# Patient Record
Sex: Female | Born: 1940 | Race: White | Hispanic: No | Marital: Single | Smoking: Never smoker
Health system: Southern US, Community
[De-identification: ages and names within clinical notes are randomized; demographics above are authoritative.]

## PROBLEM LIST (undated history)

## (undated) DIAGNOSIS — F319 Bipolar disorder, unspecified: Secondary | ICD-10-CM

## (undated) DIAGNOSIS — E785 Hyperlipidemia, unspecified: Secondary | ICD-10-CM

## (undated) DIAGNOSIS — E039 Hypothyroidism, unspecified: Secondary | ICD-10-CM

## (undated) DIAGNOSIS — J189 Pneumonia, unspecified organism: Secondary | ICD-10-CM

## (undated) DIAGNOSIS — I10 Essential (primary) hypertension: Secondary | ICD-10-CM

## (undated) DIAGNOSIS — K5732 Diverticulitis of large intestine without perforation or abscess without bleeding: Secondary | ICD-10-CM

## (undated) DIAGNOSIS — R413 Other amnesia: Secondary | ICD-10-CM

## (undated) HISTORY — PX: NO PAST SURGERIES: SHX2092

---

## 2004-12-25 ENCOUNTER — Emergency Department (HOSPITAL_COMMUNITY): Admission: EM | Admit: 2004-12-25 | Discharge: 2004-12-25 | Payer: Self-pay | Admitting: Emergency Medicine

## 2005-11-20 ENCOUNTER — Ambulatory Visit (HOSPITAL_COMMUNITY): Admission: RE | Admit: 2005-11-20 | Discharge: 2005-11-20 | Payer: Self-pay | Admitting: Family Medicine

## 2006-11-25 ENCOUNTER — Ambulatory Visit (HOSPITAL_COMMUNITY): Admission: RE | Admit: 2006-11-25 | Discharge: 2006-11-25 | Payer: Self-pay | Admitting: Family Medicine

## 2007-12-08 ENCOUNTER — Ambulatory Visit (HOSPITAL_COMMUNITY): Admission: RE | Admit: 2007-12-08 | Discharge: 2007-12-08 | Payer: Self-pay | Admitting: Family Medicine

## 2008-12-13 ENCOUNTER — Ambulatory Visit (HOSPITAL_COMMUNITY): Admission: RE | Admit: 2008-12-13 | Discharge: 2008-12-13 | Payer: Self-pay | Admitting: Family Medicine

## 2008-12-17 ENCOUNTER — Encounter: Admission: RE | Admit: 2008-12-17 | Discharge: 2008-12-17 | Payer: Self-pay | Admitting: Family Medicine

## 2010-01-02 ENCOUNTER — Ambulatory Visit (HOSPITAL_COMMUNITY): Admission: RE | Admit: 2010-01-02 | Discharge: 2010-01-02 | Payer: Self-pay | Admitting: Family Medicine

## 2010-05-15 ENCOUNTER — Inpatient Hospital Stay (HOSPITAL_COMMUNITY)
Admission: EM | Admit: 2010-05-15 | Discharge: 2010-05-19 | Payer: Self-pay | Source: Home / Self Care | Attending: Internal Medicine | Admitting: Internal Medicine

## 2010-06-26 ENCOUNTER — Encounter: Payer: Self-pay | Admitting: Family Medicine

## 2010-08-14 LAB — CBC
HCT: 35.4 % — ABNORMAL LOW (ref 36.0–46.0)
Hemoglobin: 11.1 g/dL — ABNORMAL LOW (ref 12.0–15.0)
Hemoglobin: 11.6 g/dL — ABNORMAL LOW (ref 12.0–15.0)
MCH: 30.4 pg (ref 26.0–34.0)
MCHC: 31.4 g/dL (ref 30.0–36.0)
MCHC: 31.4 g/dL (ref 30.0–36.0)
RBC: 3.65 MIL/uL — ABNORMAL LOW (ref 3.87–5.11)
RBC: 3.85 MIL/uL — ABNORMAL LOW (ref 3.87–5.11)
RDW: 13.4 % (ref 11.5–15.5)
WBC: 9.3 10*3/uL (ref 4.0–10.5)
WBC: 9.5 10*3/uL (ref 4.0–10.5)

## 2010-08-14 LAB — BASIC METABOLIC PANEL
BUN: 4 mg/dL — ABNORMAL LOW (ref 6–23)
Chloride: 117 mEq/L — ABNORMAL HIGH (ref 96–112)
Creatinine, Ser: 0.66 mg/dL (ref 0.4–1.2)
GFR calc Af Amer: >60 mL/min (ref >60)
GFR calc Af Amer: >60 mL/min (ref >60)
GFR calc non Af Amer: >60 mL/min (ref >60)
Potassium: 3.7 mEq/L (ref 3.5–5.1)
Sodium: 147 mEq/L — ABNORMAL HIGH (ref 135–145)
Sodium: 147 mEq/L — ABNORMAL HIGH (ref 135–145)

## 2010-08-14 LAB — CK: Total CK: 89 U/L (ref 7–177)

## 2010-08-14 LAB — LITHIUM LEVEL
Lithium Lvl: 0.36 mEq/L — ABNORMAL LOW (ref 0.80–1.40)
Lithium Lvl: 0.49 mEq/L — ABNORMAL LOW (ref 0.80–1.40)

## 2010-08-15 LAB — LITHIUM LEVEL: Lithium Lvl: 2.15 mEq/L (ref 0.80–1.40)

## 2010-08-15 LAB — BASIC METABOLIC PANEL
BUN: 20 mg/dL (ref 6–23)
BUN: 7 mg/dL (ref 6–23)
CO2: 25 mEq/L (ref 19–32)
CO2: 25 mEq/L (ref 19–32)
Chloride: 107 mEq/L (ref 96–112)
Chloride: 117 mEq/L — ABNORMAL HIGH (ref 96–112)
Creatinine, Ser: 0.74 mg/dL (ref 0.4–1.2)
Creatinine, Ser: 1.39 mg/dL — ABNORMAL HIGH (ref 0.4–1.2)
Glucose, Bld: 104 mg/dL — ABNORMAL HIGH (ref 70–99)
Glucose, Bld: 129 mg/dL — ABNORMAL HIGH (ref 70–99)

## 2010-08-15 LAB — RAPID URINE DRUG SCREEN, HOSP PERFORMED
Amphetamines: NOT DETECTED
Barbiturates: NOT DETECTED
Benzodiazepines: NOT DETECTED
Cocaine: NOT DETECTED

## 2010-08-15 LAB — CBC
HCT: 39.5 % (ref 36.0–46.0)
Hemoglobin: 13.2 g/dL (ref 12.0–15.0)
MCH: 30.7 pg (ref 26.0–34.0)
MCHC: 33.4 g/dL (ref 30.0–36.0)
MCV: 91.9 fL (ref 78.0–100.0)
Platelets: 347 10*3/uL (ref 150–400)
RBC: 4.3 MIL/uL (ref 3.87–5.11)
RDW: 12.2 % (ref 11.5–15.5)
WBC: 13.2 10*3/uL — ABNORMAL HIGH (ref 4.0–10.5)

## 2010-08-15 LAB — LIPASE, BLOOD: Lipase: 25 U/L (ref 11–59)

## 2010-08-15 LAB — URINALYSIS, ROUTINE W REFLEX MICROSCOPIC
Nitrite: NEGATIVE
Specific Gravity, Urine: 1.019 (ref 1.005–1.030)
Urobilinogen, UA: 0.2 mg/dL (ref 0.0–1.0)
pH: 6 (ref 5.0–8.0)

## 2010-08-15 LAB — COMPREHENSIVE METABOLIC PANEL
BUN: 24 mg/dL — ABNORMAL HIGH (ref 6–23)
CO2: 24 mEq/L (ref 19–32)
Calcium: 10.4 mg/dL (ref 8.4–10.5)
Creatinine, Ser: 1.85 mg/dL — ABNORMAL HIGH (ref 0.4–1.2)
GFR calc non Af Amer: 27 mL/min — ABNORMAL LOW (ref >60)
Glucose, Bld: 123 mg/dL — ABNORMAL HIGH (ref 70–99)
Sodium: 137 mEq/L (ref 135–145)
Total Protein: 6.9 g/dL (ref 6.0–8.3)

## 2010-08-15 LAB — URINE CULTURE
Colony Count: >=100000
Culture  Setup Time: 201112122359

## 2010-08-15 LAB — URINE MICROSCOPIC-ADD ON

## 2010-08-15 LAB — DIFFERENTIAL
Eosinophils Absolute: 0.1 10*3/uL (ref 0.0–0.7)
Lymphocytes Relative: 10 % — ABNORMAL LOW (ref 12–46)
Lymphs Abs: 1.3 10*3/uL (ref 0.7–4.0)
Monocytes Relative: 7 % (ref 3–12)
Neutro Abs: 10.8 10*3/uL — ABNORMAL HIGH (ref 1.7–7.7)
Neutrophils Relative %: 82 % — ABNORMAL HIGH (ref 43–77)

## 2010-08-15 LAB — TSH: TSH: 2.372 u[IU]/mL (ref 0.350–4.500)

## 2010-08-15 LAB — POCT CARDIAC MARKERS
CKMB, poc: 2.5 ng/mL (ref 1.0–8.0)
Myoglobin, poc: 342 ng/mL (ref 12–200)
Troponin i, poc: <0.05 ng/mL (ref 0.00–0.09)

## 2010-08-15 LAB — CARBAMAZEPINE LEVEL, TOTAL: Carbamazepine Lvl: 10.2 ug/mL (ref 4.0–12.0)

## 2010-10-07 ENCOUNTER — Emergency Department (HOSPITAL_COMMUNITY): Payer: PRIVATE HEALTH INSURANCE

## 2010-10-07 ENCOUNTER — Emergency Department (HOSPITAL_COMMUNITY)
Admission: EM | Admit: 2010-10-07 | Discharge: 2010-10-09 | Disposition: A | Discharge status: Other Acute Inpatient Hospital | Payer: PRIVATE HEALTH INSURANCE | Attending: Emergency Medicine | Admitting: Emergency Medicine

## 2010-10-07 DIAGNOSIS — F319 Bipolar disorder, unspecified: Secondary | ICD-10-CM | POA: Insufficient documentation

## 2010-10-07 DIAGNOSIS — R4182 Altered mental status, unspecified: Secondary | ICD-10-CM | POA: Insufficient documentation

## 2010-10-07 DIAGNOSIS — R4789 Other speech disturbances: Secondary | ICD-10-CM | POA: Insufficient documentation

## 2010-10-07 DIAGNOSIS — R443 Hallucinations, unspecified: Secondary | ICD-10-CM | POA: Insufficient documentation

## 2010-10-07 DIAGNOSIS — I6789 Other cerebrovascular disease: Secondary | ICD-10-CM | POA: Insufficient documentation

## 2010-10-07 DIAGNOSIS — I10 Essential (primary) hypertension: Secondary | ICD-10-CM | POA: Insufficient documentation

## 2010-10-07 LAB — RAPID URINE DRUG SCREEN, HOSP PERFORMED
Amphetamines: NOT DETECTED
Barbiturates: NOT DETECTED
Benzodiazepines: NOT DETECTED
Tetrahydrocannabinol: NOT DETECTED

## 2010-10-08 DIAGNOSIS — F39 Unspecified mood [affective] disorder: Secondary | ICD-10-CM

## 2010-10-08 LAB — CBC
Hemoglobin: 11.3 g/dL — ABNORMAL LOW (ref 12.0–15.0)
MCH: 29.4 pg (ref 26.0–34.0)
MCV: 90.4 fL (ref 78.0–100.0)
RBC: 3.85 MIL/uL — ABNORMAL LOW (ref 3.87–5.11)

## 2010-10-08 LAB — DIFFERENTIAL
Eosinophils Absolute: 0 10*3/uL (ref 0.0–0.7)
Lymphs Abs: 0.8 10*3/uL (ref 0.7–4.0)
Monocytes Relative: 9 % (ref 3–12)
Neutro Abs: 5.3 10*3/uL (ref 1.7–7.7)
Neutrophils Relative %: 79 % — ABNORMAL HIGH (ref 43–77)

## 2010-10-08 LAB — URINALYSIS, ROUTINE W REFLEX MICROSCOPIC
Hgb urine dipstick: NEGATIVE
Protein, ur: NEGATIVE mg/dL
Urobilinogen, UA: 0.2 mg/dL (ref 0.0–1.0)

## 2010-10-08 LAB — COMPREHENSIVE METABOLIC PANEL
BUN: 27 mg/dL — ABNORMAL HIGH (ref 6–23)
CO2: 25 mEq/L (ref 19–32)
Chloride: 105 mEq/L (ref 96–112)
Creatinine, Ser: 0.82 mg/dL (ref 0.4–1.2)
GFR calc non Af Amer: >60 mL/min (ref >60)
Total Bilirubin: 0.2 mg/dL — ABNORMAL LOW (ref 0.3–1.2)

## 2010-10-08 LAB — LITHIUM LEVEL: Lithium Lvl: <0.25 mEq/L — ABNORMAL LOW (ref 0.80–1.40)

## 2010-10-08 LAB — URINE MICROSCOPIC-ADD ON

## 2010-10-09 DIAGNOSIS — F319 Bipolar disorder, unspecified: Secondary | ICD-10-CM

## 2012-02-21 ENCOUNTER — Other Ambulatory Visit (HOSPITAL_COMMUNITY): Payer: Self-pay | Admitting: Family Medicine

## 2012-02-21 DIAGNOSIS — Z1231 Encounter for screening mammogram for malignant neoplasm of breast: Secondary | ICD-10-CM

## 2012-03-04 ENCOUNTER — Ambulatory Visit (HOSPITAL_COMMUNITY): Payer: PRIVATE HEALTH INSURANCE

## 2014-04-26 ENCOUNTER — Inpatient Hospital Stay (HOSPITAL_COMMUNITY)
Admission: EM | Admit: 2014-04-26 | Discharge: 2014-05-03 | DRG: 194 | Disposition: A | Discharge status: Skilled Nursing Facility | Payer: PRIVATE HEALTH INSURANCE | Attending: Internal Medicine | Admitting: Internal Medicine

## 2014-04-26 ENCOUNTER — Encounter (HOSPITAL_COMMUNITY): Payer: Self-pay | Admitting: Emergency Medicine

## 2014-04-26 ENCOUNTER — Emergency Department (HOSPITAL_COMMUNITY): Payer: PRIVATE HEALTH INSURANCE

## 2014-04-26 DIAGNOSIS — I10 Essential (primary) hypertension: Secondary | ICD-10-CM

## 2014-04-26 DIAGNOSIS — R441 Visual hallucinations: Secondary | ICD-10-CM | POA: Diagnosis present

## 2014-04-26 DIAGNOSIS — Z888 Allergy status to other drugs, medicaments and biological substances status: Secondary | ICD-10-CM | POA: Diagnosis not present

## 2014-04-26 DIAGNOSIS — E039 Hypothyroidism, unspecified: Secondary | ICD-10-CM | POA: Diagnosis not present

## 2014-04-26 DIAGNOSIS — E870 Hyperosmolality and hypernatremia: Secondary | ICD-10-CM | POA: Diagnosis not present

## 2014-04-26 DIAGNOSIS — R05 Cough: Secondary | ICD-10-CM | POA: Insufficient documentation

## 2014-04-26 DIAGNOSIS — E785 Hyperlipidemia, unspecified: Secondary | ICD-10-CM | POA: Diagnosis not present

## 2014-04-26 DIAGNOSIS — E860 Dehydration: Secondary | ICD-10-CM | POA: Diagnosis present

## 2014-04-26 DIAGNOSIS — Z885 Allergy status to narcotic agent status: Secondary | ICD-10-CM | POA: Diagnosis not present

## 2014-04-26 DIAGNOSIS — H109 Unspecified conjunctivitis: Secondary | ICD-10-CM | POA: Diagnosis not present

## 2014-04-26 DIAGNOSIS — J690 Pneumonitis due to inhalation of food and vomit: Secondary | ICD-10-CM | POA: Diagnosis not present

## 2014-04-26 DIAGNOSIS — Z23 Encounter for immunization: Secondary | ICD-10-CM | POA: Diagnosis not present

## 2014-04-26 DIAGNOSIS — J189 Pneumonia, unspecified organism: Principal | ICD-10-CM | POA: Diagnosis present

## 2014-04-26 DIAGNOSIS — R413 Other amnesia: Secondary | ICD-10-CM | POA: Diagnosis not present

## 2014-04-26 DIAGNOSIS — F319 Bipolar disorder, unspecified: Secondary | ICD-10-CM

## 2014-04-26 DIAGNOSIS — N179 Acute kidney failure, unspecified: Secondary | ICD-10-CM | POA: Diagnosis not present

## 2014-04-26 DIAGNOSIS — R531 Weakness: Secondary | ICD-10-CM | POA: Insufficient documentation

## 2014-04-26 DIAGNOSIS — R350 Frequency of micturition: Secondary | ICD-10-CM

## 2014-04-26 DIAGNOSIS — R059 Cough, unspecified: Secondary | ICD-10-CM

## 2014-04-26 HISTORY — DX: Bipolar disorder, unspecified: F31.9

## 2014-04-26 HISTORY — DX: Hyperlipidemia, unspecified: E78.5

## 2014-04-26 HISTORY — DX: Other amnesia: R41.3

## 2014-04-26 HISTORY — DX: Hypothyroidism, unspecified: E03.9

## 2014-04-26 HISTORY — DX: Diverticulitis of large intestine without perforation or abscess without bleeding: K57.32

## 2014-04-26 HISTORY — DX: Essential (primary) hypertension: I10

## 2014-04-26 LAB — COMPREHENSIVE METABOLIC PANEL
ALT: 31 U/L (ref 0–35)
ANION GAP: 12 (ref 5–15)
AST: 34 U/L (ref 0–37)
Albumin: 3.8 g/dL (ref 3.5–5.2)
Alkaline Phosphatase: 137 U/L — ABNORMAL HIGH (ref 39–117)
BUN: 23 mg/dL (ref 6–23)
CALCIUM: 11.2 mg/dL — AB (ref 8.4–10.5)
CO2: 29 meq/L (ref 19–32)
CREATININE: 1.26 mg/dL — AB (ref 0.50–1.10)
Chloride: 99 mEq/L (ref 96–112)
GFR, EST AFRICAN AMERICAN: 48 mL/min — AB (ref >90)
GFR, EST NON AFRICAN AMERICAN: 41 mL/min — AB (ref >90)
GLUCOSE: 133 mg/dL — AB (ref 70–99)
Potassium: 4.7 mEq/L (ref 3.7–5.3)
Sodium: 140 mEq/L (ref 137–147)
TOTAL PROTEIN: 9.2 g/dL — AB (ref 6.0–8.3)
Total Bilirubin: <0.2 mg/dL — ABNORMAL LOW (ref 0.3–1.2)

## 2014-04-26 LAB — CBC WITH DIFFERENTIAL/PLATELET
Basophils Absolute: 0 10*3/uL (ref 0.0–0.1)
Basophils Relative: 0 % (ref 0–1)
EOS PCT: 1 % (ref 0–5)
Eosinophils Absolute: 0.1 10*3/uL (ref 0.0–0.7)
HEMATOCRIT: 40.9 % (ref 36.0–46.0)
Hemoglobin: 13.1 g/dL (ref 12.0–15.0)
LYMPHS ABS: 0.8 10*3/uL (ref 0.7–4.0)
LYMPHS PCT: 5 % — AB (ref 12–46)
MCH: 31.6 pg (ref 26.0–34.0)
MCHC: 32 g/dL (ref 30.0–36.0)
MCV: 98.6 fL (ref 78.0–100.0)
MONO ABS: 1.8 10*3/uL — AB (ref 0.1–1.0)
Monocytes Relative: 10 % (ref 3–12)
Neutro Abs: 14.6 10*3/uL — ABNORMAL HIGH (ref 1.7–7.7)
Neutrophils Relative %: 84 % — ABNORMAL HIGH (ref 43–77)
Platelets: 329 10*3/uL (ref 150–400)
RBC: 4.15 MIL/uL (ref 3.87–5.11)
RDW: 12.7 % (ref 11.5–15.5)
WBC: 17.3 10*3/uL — ABNORMAL HIGH (ref 4.0–10.5)

## 2014-04-26 LAB — URINALYSIS, ROUTINE W REFLEX MICROSCOPIC
Bilirubin Urine: NEGATIVE
Glucose, UA: NEGATIVE mg/dL
Ketones, ur: NEGATIVE mg/dL
NITRITE: NEGATIVE
PH: 7 (ref 5.0–8.0)
Protein, ur: NEGATIVE mg/dL
Specific Gravity, Urine: 1.008 (ref 1.005–1.030)
Urobilinogen, UA: 0.2 mg/dL (ref 0.0–1.0)

## 2014-04-26 LAB — URINE MICROSCOPIC-ADD ON

## 2014-04-26 MED ORDER — SODIUM CHLORIDE 0.9 % IV BOLUS (SEPSIS)
1000.0000 mL | Freq: Once | INTRAVENOUS | Status: AC
  Administered 2014-04-26: 1000 mL via INTRAVENOUS

## 2014-04-26 MED ORDER — ONDANSETRON HCL 4 MG/2ML IJ SOLN: 4.0000 mg | Freq: Four times a day (QID) | INTRAMUSCULAR | Status: DC | PRN

## 2014-04-26 MED ORDER — GUAIFENESIN-DM 100-10 MG/5ML PO SYRP
5.0000 mL | ORAL_SOLUTION | ORAL | Status: DC | PRN
  Administered 2014-04-28 – 2014-04-29 (×3): 5 mL via ORAL
  Filled 2014-04-26 (×3): qty 10

## 2014-04-26 MED ORDER — ONDANSETRON HCL 4 MG PO TABS: 4.0000 mg | ORAL_TABLET | Freq: Four times a day (QID) | ORAL | Status: DC | PRN

## 2014-04-26 MED ORDER — DEXTROSE 5 % IV SOLN
500.0000 mg | INTRAVENOUS | Status: DC
  Administered 2014-04-26: 500 mg via INTRAVENOUS
  Filled 2014-04-26: qty 500

## 2014-04-26 MED ORDER — SIMVASTATIN 20 MG PO TABS
20.0000 mg | ORAL_TABLET | Freq: Every day | ORAL | Status: DC
  Administered 2014-04-27 – 2014-05-02 (×6): 20 mg via ORAL
  Filled 2014-04-26 (×7): qty 1

## 2014-04-26 MED ORDER — IPRATROPIUM-ALBUTEROL 0.5-2.5 (3) MG/3ML IN SOLN
3.0000 mL | Freq: Once | RESPIRATORY_TRACT | Status: AC
  Administered 2014-04-26: 3 mL via RESPIRATORY_TRACT
  Filled 2014-04-26: qty 3

## 2014-04-26 MED ORDER — LITHIUM CARBONATE 300 MG PO CAPS
600.0000 mg | ORAL_CAPSULE | Freq: Every day | ORAL | Status: DC
  Administered 2014-04-27: 600 mg via ORAL
  Filled 2014-04-26 (×2): qty 2

## 2014-04-26 MED ORDER — POLYETHYLENE GLYCOL 3350 17 G PO PACK
17.0000 g | PACK | Freq: Every day | ORAL | Status: DC | PRN
  Filled 2014-04-26: qty 1

## 2014-04-26 MED ORDER — LITHIUM CARBONATE 300 MG PO CAPS
300.0000 mg | ORAL_CAPSULE | Freq: Every day | ORAL | Status: DC
  Filled 2014-04-26: qty 1

## 2014-04-26 MED ORDER — SODIUM CHLORIDE 0.9 % IV SOLN
INTRAVENOUS | Status: DC
  Administered 2014-04-27: via INTRAVENOUS
  Administered 2014-04-27: 100 mL/h via INTRAVENOUS
  Administered 2014-04-28: 11:00:00 via INTRAVENOUS

## 2014-04-26 MED ORDER — AMLODIPINE BESYLATE 5 MG PO TABS
5.0000 mg | ORAL_TABLET | Freq: Every day | ORAL | Status: DC
  Administered 2014-04-27 – 2014-05-03 (×7): 5 mg via ORAL
  Filled 2014-04-26 (×7): qty 1

## 2014-04-26 MED ORDER — LEVOFLOXACIN IN D5W 750 MG/150ML IV SOLN
750.0000 mg | INTRAVENOUS | Status: DC
Start: 2014-04-27 — End: 2014-04-28
  Filled 2014-04-26 (×2): qty 150

## 2014-04-26 MED ORDER — SENNA 8.6 MG PO TABS
1.0000 | ORAL_TABLET | Freq: Two times a day (BID) | ORAL | Status: DC
  Administered 2014-04-27 – 2014-05-02 (×12): 8.6 mg via ORAL
  Filled 2014-04-26 (×11): qty 1

## 2014-04-26 MED ORDER — SODIUM CHLORIDE 0.9 % IJ SOLN
3.0000 mL | Freq: Two times a day (BID) | INTRAMUSCULAR | Status: DC
  Administered 2014-04-27 – 2014-05-01 (×7): 3 mL via INTRAVENOUS

## 2014-04-26 MED ORDER — DEXTROSE 5 % IV SOLN
1.0000 g | INTRAVENOUS | Status: DC
  Administered 2014-04-26: 1 g via INTRAVENOUS
  Filled 2014-04-26: qty 10

## 2014-04-26 MED ORDER — LEVOTHYROXINE SODIUM 125 MCG PO TABS
125.0000 ug | ORAL_TABLET | Freq: Every day | ORAL | Status: DC
  Administered 2014-04-27 – 2014-05-03 (×7): 125 ug via ORAL
  Filled 2014-04-26 (×9): qty 1

## 2014-04-26 MED ORDER — OLOPATADINE HCL 0.1 % OP SOLN
1.0000 [drp] | Freq: Two times a day (BID) | OPHTHALMIC | Status: DC
  Administered 2014-04-27 – 2014-05-03 (×13): 1 [drp] via OPHTHALMIC
  Filled 2014-04-26: qty 5

## 2014-04-26 MED ORDER — HEPARIN SODIUM (PORCINE) 5000 UNIT/ML IJ SOLN
5000.0000 [IU] | Freq: Three times a day (TID) | INTRAMUSCULAR | Status: DC
  Administered 2014-04-27 – 2014-05-03 (×20): 5000 [IU] via SUBCUTANEOUS
  Filled 2014-04-26 (×23): qty 1

## 2014-04-26 MED ORDER — LURASIDONE HCL 40 MG PO TABS
60.0000 mg | ORAL_TABLET | Freq: Every day | ORAL | Status: DC
  Administered 2014-04-27 – 2014-05-03 (×7): 60 mg via ORAL
  Filled 2014-04-26 (×8): qty 2

## 2014-04-26 MED ORDER — CARBAMAZEPINE 200 MG PO TABS
200.0000 mg | ORAL_TABLET | Freq: Two times a day (BID) | ORAL | Status: DC
  Administered 2014-04-27 – 2014-05-03 (×14): 200 mg via ORAL
  Filled 2014-04-26 (×15): qty 1

## 2014-04-26 MED ORDER — BENZTROPINE MESYLATE 1 MG PO TABS
1.0000 mg | ORAL_TABLET | Freq: Every day | ORAL | Status: DC
  Administered 2014-04-27 – 2014-05-03 (×7): 1 mg via ORAL
  Filled 2014-04-26 (×7): qty 1

## 2014-04-26 NOTE — Progress Notes (Signed)
  CARE MANAGEMENT ED NOTE 04/26/2014  Patient:  [name] [name],[name] [name]   Account Number:  [id number]  Date Initiated:  04/26/2014  Documentation initiated by:  Radford PaxFERRERO,Ronin Rehfeldt  Subjective/Objective Assessment:   Patient presents to Ed with cough and congestion for three days.     Subjective/Objective Assessment Detail:   Patient with pmhx of HTN.  WBC 17.3.  885 on 2 liters O2, 2 liters oxygen applied via nasal cannula.     Action/Plan:   Action/Plan Detail:   Anticipated DC Date:       Status Recommendation to Physician:   Result of Recommendation:    Other ED Services  Consult Working Plan   In-house referral  Clinical Social Worker   DC Associate Professorlanning Services  Other  PCP issues    Choice offered to / List presented to:            Status of service:  Completed, signed off  ED Comments:   ED Comments Detail:  EDCM spoke topatient and her fmaily at bedside.  Patient's son [name] [name] reports he was the patient's POA a few years ago but does not have that paperwork and is wodering if he still is her POA?  [name] [name] reports the patient lives with her daughter and son in law.  Patient has Psychotherapeutic services coming to the patient's home. [name] [name] reports the doctor from [facility] comes to home once a month and a RN and case manager also follow the patient. Patient has no other homehealth services at this time. Patient has a shower chair at home.   Patient's son reports up until this week, patient was able to perform her own ADL's.  she has always needed assistance with meals. Patient's son reports that the daughter that patient is living with currently is moving in about a month and they are currently in the process of finding the patient a ALF. Patient is listed as having Medicaid.  No further EDCM needs at this time.

## 2014-04-26 NOTE — ED Notes (Signed)
Pt from home via [facility] c/o cough, congestion, weakness, and urinary frequency for a few days. Thick green sputum present. Pt feels warm to touch.

## 2014-04-26 NOTE — ED Notes (Signed)
Bed: WA14 Expected date:  Expected time:  Means of arrival:  Comments: ems 

## 2014-04-26 NOTE — H&P (Signed)
[facility] History and Physical  [name] Arhoebe Selmon ZOX:[id number] DOB: 1941-02-08 DOA: 04/26/2014  Referring physician: Dr. Rondall AllegraAllen - [facility] PCP: No primary care provider on file.   Chief Complaint: cough and congestion  HPI: [name] [name] is a 73 y.o. female  7 day h/o cough and congestion. 3 days ago became significantly worse. OTC cold medicines w/o benefit. Now w/ productive green sputum. Associated w/ subjective fevers and generalized weakness. Associated w/ vomiting. NO home O2.   Review of Systems:  Constitutional:  eight loss, night sweats, Fevers, chills, fatigue.  HEENT:   Denies sneezing, nasal congestion, post nasal drip,  Cardio-vascular:  No chest pain, Orthopnea, PND, swelling in lower extremities, anasarca, dizziness, palpitations  GI:  No heartburn, indigestion, abdominal pain, nausea, vomiting, diarrhea, change in bowel habits, Resp:  Per HPI Skin:  no rash or lesions.  GU:  no dysuria, change in color of urine, no frequency. No flank pain.  Endorses urgency Musculoskeletal:  No joint pain or swelling. No decreased range of motion. No back pain.  Psych:  No change in mood or affect. No depression or anxiety. No memory loss.   Past Medical History  Diagnosis Date  . Hypertension   . Bipolar 1 disorder   . Hypothyroidism   . Memory difficulties   . HLD (hyperlipidemia)   . Diverticulitis large intestine    History reviewed. No pertinent past surgical history. Social History:  reports that she has never smoked. She does not have any smokeless tobacco history on file. She reports that she does not drink alcohol. Her drug history is not on file.  Allergies  Allergen Reactions  . Antihistamine Decongestant [Triprolidine-Pse] Diarrhea  . Codeine Other (See Comments)    Gi upset     Family History  Problem Relation Age of Onset  . Heart disease Father      Prior to Admission medications   Not on File   Physical Exam: Filed Vitals:   04/26/14  1909 04/26/14 1914 04/26/14 1938 04/26/14 2200  BP: 167/84   139/59  Pulse: 92   76  Temp: 99.2 F (37.3 C)     TempSrc: Rectal     Resp: 22   20  SpO2: 88% 97% 96% 91%    Wt Readings from Last 3 Encounters:  No data found for Wt    General: sleeping comfortably. Easily awoken and engaged in conversation.  Eyes: L eye w/ mild crusty discharge and injected slcera throughout. R eye nml. PERRL, EOMI ENT: Dry mucus membranes.   Neck:  no LAD, masses or thyromegaly Cardiovascular: III/VI systolic murmur, RRR, Trace LE edema bilat Telemetry:  SR, no arrhythmias  Respiratory: Crackles in R lung base w/ slightly diminished breath sounds. Slight increased WOB.  Abdomen:  soft, ntnd Skin:  no rash or induration seen on limited exam Musculoskeletal:  grossly normal tone BUE/BLE Psychiatric:  grossly normal mood and affect, speech fluent and appropriate Neurologic:  grossly non-focal.          Labs on Admission:  Basic Metabolic Panel:  Recent Labs Lab 04/26/14 2037  NA 140  K 4.7  CL 99  CO2 29  GLUCOSE 133*  BUN 23  CREATININE 1.26*  CALCIUM 11.2*   Liver Function Tests:  Recent Labs Lab 04/26/14 2037  AST 34  ALT 31  ALKPHOS 137*  BILITOT <0.2*  PROT 9.2*  ALBUMIN 3.8   No results for input(s): LIPASE, AMYLASE in the last 168 hours. No results for input(s): AMMONIA in  the last 168 hours. CBC:  Recent Labs Lab 04/26/14 2037  WBC 17.3*  NEUTROABS 14.6*  HGB 13.1  HCT 40.9  MCV 98.6  PLT 329   Cardiac Enzymes: No results for input(s): CKTOTAL, CKMB, CKMBINDEX, TROPONINI in the last 168 hours.  BNP (last 3 results) No results for input(s): PROBNP in the last 8760 hours. CBG: No results for input(s): GLUCAP in the last 168 hours.  Radiological Exams on Admission: Dg Chest Port 1 View  04/26/2014   CLINICAL DATA:  Weakness and cough  EXAM: PORTABLE CHEST - 1 VIEW  COMPARISON:  05/15/2010  FINDINGS: Cardiac shadow is stable. The thoracic aorta is  mildly tortuous. Increased density is noted in the right lung base consistent with early infiltrate. No sizable effusion is seen.  IMPRESSION: Early right basilar infiltrate.   Electronically Signed   By: Alcide CleverMark  Lukens M.D.   On: 04/26/2014 21:57    EKG: Independently reviewed. NSR, occasional PAC. No sign of ischemia   Assessment/Plan Principal Problem:   Community acquired pneumonia Active Problems:   Bipolar 1 disorder   Essential hypertension   Conjunctivitis   HLD (hyperlipidemia)   Hypothyroid   Acute kidney injury  Community Acquired Pneumonia: WBC 17.3, w/ L shift. O2 requirement. CXR showing RLL infiltrate. CTX and Azithro given in ED - Admit - Change Abx to levaquin ( first dose set for 04/27/14) - sputum Cx, legionella Ag, Strep pneumo Ag.  - f/u BCX  AKI: Cr 1.26 on admission. Baseline 0.7. Likely secondary to dehydration.  - IVF NS 1200ml/hr - 1L NS bolus - BMET in am  HTN: intermittently taking over past few days due to illness.  - continue Norvasc  Bipolar: at baseline. Pt w/ known visual and auditory hallucinations at baseline. Intermittent dosing at home over the past couple of days.  - Tegretol and Lithium levels - Continue home Carbamazepine, lithium, latuda, Cogentin - Haldol PRN (pt on Qmo Haldol injections - unsure of when last dose was given)  Conjunctivitis: L eye. Likely viral as non-painful and minimal discharge - [place]  HLD:  - continue home statin  Hypothyroid: - continue home Synthroid   Code Status: FULL DVT Prophylaxis: Hep Family Communication: Son and daughter in law  Disposition Plan: pending improvement  Time spent: >70 min in direct pt care and coordination.   MERRELL, DAVID J [facility] [email or web address] Password [email or web address]

## 2014-04-26 NOTE — ED Provider Notes (Signed)
CSN: [id number]     Arrival date & time 04/26/14  1904 History   First MD Initiated Contact with Patient 04/26/14 1941     Chief Complaint  Patient presents with  . Cough  . Urinary Frequency  . Weakness     (Consider location/radiation/quality/duration/timing/severity/associated sxs/prior Treatment) HPI Comments: Patient here complaining of cough and congestion 3 days. Cough is been productive of green sputum. No vomiting or diarrhea. Subjective fever at home. Increased weakness. Urinary frequency as well 2. Denies any abdominal pain. No rashes. No headache or neck pain. Symptoms persistent and are worse with activity and better with rest. Has used over-the-counter medications without relief.  Patient is a 73 y.o. female presenting with cough, frequency, and weakness. The history is provided by the patient and a relative.  Cough Urinary Frequency  Weakness    Past Medical History  Diagnosis Date  . Hypertension    History reviewed. No pertinent past surgical history. No family history on file. History  Substance Use Topics  . Smoking status: Never Smoker   . Smokeless tobacco: Not on file  . Alcohol Use: No   OB History    No data available     Review of Systems  Respiratory: Positive for cough.   Genitourinary: Positive for frequency.  Neurological: Positive for weakness.  All other systems reviewed and are negative.     Allergies  Review of patient's allergies indicates not on file.  Home Medications   Prior to Admission medications   Not on File   BP 167/84 mmHg  Pulse 92  Temp(Src) 99.2 F (37.3 C) (Rectal)  Resp 22  SpO2 96% Physical Exam  Constitutional: She is oriented to person, place, and time. She appears well-developed and well-nourished.  Non-toxic appearance. No distress.  HENT:  Head: Normocephalic and atraumatic.  Eyes: Conjunctivae, EOM and lids are normal. Pupils are equal, round, and reactive to light.  Neck: Normal range of  motion. Neck supple. No tracheal deviation present. No thyroid mass present.  Cardiovascular: Normal rate, regular rhythm and normal heart sounds.  Exam reveals no gallop.   No murmur heard. Pulmonary/Chest: Effort normal. No stridor. No respiratory distress. She has decreased breath sounds. She has no wheezes. She has rhonchi. She has no rales.  Abdominal: Soft. Normal appearance and bowel sounds are normal. She exhibits no distension. There is no tenderness. There is no rebound and no CVA tenderness.  Musculoskeletal: Normal range of motion. She exhibits no edema or tenderness.  Neurological: She is alert and oriented to person, place, and time. She has normal strength. No cranial nerve deficit or sensory deficit. GCS eye subscore is 4. GCS verbal subscore is 5. GCS motor subscore is 6.  Skin: Skin is warm and dry. No abrasion and no rash noted.  Psychiatric: She has a normal mood and affect. Her speech is normal and behavior is normal.  Nursing note and vitals reviewed.   ED Course  Procedures (including critical care time) Labs Review Labs Reviewed  URINE CULTURE  CULTURE, BLOOD (ROUTINE X 2)  CULTURE, BLOOD (ROUTINE X 2)  URINALYSIS, ROUTINE W REFLEX MICROSCOPIC  CBC WITH DIFFERENTIAL  COMPREHENSIVE METABOLIC PANEL  I-STAT CG4 LACTIC ACID, ED    Imaging Review No results found.   EKG Interpretation   Date/Time:  Monday April 26 2014 19:31:51 EST Ventricular Rate:  84 PR Interval:  147 QRS Duration: 81 QT Interval:  330 QTC Calculation: 390 R Axis:   9 Text Interpretation:  Sinus rhythm  Atrial premature complex Nonspecific T  abnormalities, lateral leads Baseline wander in lead(s) V2 Confirmed by  Jaretssi Kraker  MD, Ruthella Kirchman ([id number]) on 04/26/2014 7:58:53 PM      MDM   Final diagnoses:  None    Pt started on iv antibiotics  For treatment of pneumonia. Will be admitted to the hospitalist service   Toy BakerAnthony T Terelle Dobler, MD 04/26/14 2227

## 2014-04-27 LAB — BASIC METABOLIC PANEL
ANION GAP: 10 (ref 5–15)
BUN: 18 mg/dL (ref 6–23)
CHLORIDE: 106 meq/L (ref 96–112)
CO2: 28 meq/L (ref 19–32)
Calcium: 10.7 mg/dL — ABNORMAL HIGH (ref 8.4–10.5)
Creatinine, Ser: 1.09 mg/dL (ref 0.50–1.10)
GFR calc Af Amer: 57 mL/min — ABNORMAL LOW (ref >90)
GFR calc non Af Amer: 49 mL/min — ABNORMAL LOW (ref >90)
Glucose, Bld: 106 mg/dL — ABNORMAL HIGH (ref 70–99)
POTASSIUM: 4.6 meq/L (ref 3.7–5.3)
SODIUM: 144 meq/L (ref 137–147)

## 2014-04-27 LAB — CARBAMAZEPINE LEVEL, TOTAL: Carbamazepine Lvl: 6.8 ug/mL (ref 4.0–12.0)

## 2014-04-27 LAB — INFLUENZA PANEL BY PCR (TYPE A & B)
H1N1 flu by pcr: NOT DETECTED
INFLBPCR: NEGATIVE
Influenza A By PCR: NEGATIVE

## 2014-04-27 LAB — CBC
HCT: 42.8 % (ref 36.0–46.0)
Hemoglobin: 13.2 g/dL (ref 12.0–15.0)
MCH: 30.8 pg (ref 26.0–34.0)
MCHC: 30.8 g/dL (ref 30.0–36.0)
MCV: 99.8 fL (ref 78.0–100.0)
Platelets: 273 10*3/uL (ref 150–400)
RBC: 4.29 MIL/uL (ref 3.87–5.11)
RDW: 13.1 % (ref 11.5–15.5)
WBC: 16 10*3/uL — AB (ref 4.0–10.5)

## 2014-04-27 LAB — LITHIUM LEVEL: LITHIUM LVL: 1.68 meq/L — AB (ref 0.80–1.40)

## 2014-04-27 LAB — STREP PNEUMONIAE URINARY ANTIGEN: Strep Pneumo Urinary Antigen: NEGATIVE

## 2014-04-27 MED ORDER — MAGIC MOUTHWASH W/LIDOCAINE
15.0000 mL | Freq: Four times a day (QID) | ORAL | Status: DC | PRN
  Filled 2014-04-27: qty 15

## 2014-04-27 NOTE — Plan of Care (Signed)
Problem: Phase I Progression Outcomes Goal: OOB as tolerated unless otherwise ordered Outcome: Completed/Met Date Met:  04/27/14 Goal: Initial discharge plan identified Outcome: Completed/Met Date Met:  04/27/14 Goal: Voiding-avoid urinary catheter unless indicated Outcome: Completed/Met Date Met:  04/27/14

## 2014-04-27 NOTE — Evaluation (Signed)
Occupational Therapy Evaluation Patient Details Name: [name] [name] MRN: [id number] DOB: [birth year] Today's Date: 04/27/2014    History of Present Illness Pt is a 73 yo female admitted for 7 day h/o cough, weakness, congestion, fever and vomiting.  Pt with CAP.  Pt with h/o bipolar d/o, memory loss, HTN, diverticulitis.   Clinical Impression   Pt admitted with above diagnosis and has the deficits listed below.  Pt would benefit from cont OT to attempt to increase I with basic adls back to baseline and decreased the burden of care on the daughter if she is involved with her mother's care after d/c.    Follow Up Recommendations  SNF;Supervision/Assistance - 24 hour    Equipment Recommendations  Other (comment) (unable to determine what pt already has.)    Recommendations for Other Services       Precautions / Restrictions Precautions Precautions: Fall;Other (comment) (droplet precautions) Restrictions Weight Bearing Restrictions: No      Mobility Bed Mobility Overal bed mobility: Needs Assistance Bed Mobility: Supine to Sit     Supine to sit: Max assist     General bed mobility comments: Pt started to come to sitting position but then required assist to finish.  Pt falling asleep every few seconds.  Transfers Overall transfer level: Needs assistance Equipment used: 1 person hand held assist Transfers: Sit to/from [employer] Sit to Stand: Mod assist Stand pivot transfers: Max assist       General transfer comment: Pt required assist to advance feet during transfer.    Balance Overall balance assessment: Needs assistance Sitting-balance support: Feet supported;Bilateral upper extremity supported Sitting balance-Leahy Scale: Zero Sitting balance - Comments: Pt falling back and to the R when sitting on EOB   Standing balance support: Bilateral upper extremity supported;During functional activity Standing balance-Leahy Scale: Zero Standing balance  comment: Pt had full outside support to remain standing.                            ADL Overall ADL's : Needs assistance/impaired Eating/Feeding: Moderate assistance;Sitting Eating/Feeding Details (indicate cue type and reason): pt falling asleep.  would attempt to take a bite and then drop the fork Grooming: Total assistance;Sitting Grooming Details (indicate cue type and reason): could not stay awake. Would initiate task but no follow through. Upper Body Bathing: Maximal assistance;Sitting   Lower Body Bathing: Total assistance;Sit to/from stand   Upper Body Dressing : Total assistance;Sitting   Lower Body Dressing: Total assistance;+2 for physical assistance;Sit to/from stand   Toilet Transfer: Maximal assistance;BSC   Toileting- Clothing Manipulation and Hygiene: +2 for physical assistance;Total assistance;Sit to/from stand       Functional mobility during ADLs: Maximal assistance;+2 for physical assistance General ADL Comments: Pt very lethargic so participation was low. Will continue to evaluate next session.     Vision                 Additional Comments: Pt with pink eye in L eye and L eye dropped.  It would open part way during evaluation but at times was closed. Pt not aware enough to paticipate in formal testing.   Perception     Praxis      Pertinent Vitals/Pain Pain Assessment: No/denies pain     Hand Dominance Right   Extremity/Trunk Assessment Upper Extremity Assessment Upper Extremity Assessment: RUE deficits/detail;LUE deficits/detail RUE Deficits / Details: Pt weak and not following commands to test.  Appear to be overall  2/5 strength RUE Coordination: decreased fine motor;decreased gross motor LUE Deficits / Details: Pt 2/5 strength based on observation.  Pt would not follow commands to MMT.  Pt was lethargic and falling asleep during eval. LUE Coordination: decreased fine motor;decreased gross motor   Lower Extremity  Assessment Lower Extremity Assessment: Defer to PT evaluation       Communication Communication Communication: HOH   Cognition Arousal/Alertness: Lethargic Behavior During Therapy:  (confused and lethargic) Overall Cognitive Status: Impaired/Different from baseline Area of Impairment: Orientation;Attention;Memory;Following commands;Safety/judgement;Awareness;Problem solving Orientation Level: Disoriented to;Time;Situation Current Attention Level: Focused Memory: Decreased recall of precautions;Decreased short-term memory Following Commands: Follows one step commands inconsistently Safety/Judgement: Decreased awareness of safety;Decreased awareness of deficits Awareness: Intellectual Problem Solving: Slow processing;Decreased initiation General Comments: Son stated pt has had memory deficits over last year but overall could function with supervision.   General Comments       Exercises       Shoulder Instructions      Home Living Family/patient expects to be discharged to:: Private residence Living Arrangements: Children Available Help at Discharge: Other (Comment) (unsure. no family available.) Type of Home: House                 Bathroom Toilet: Standard                Prior Functioning/Environment Level of Independence: Needs assistance  Gait / Transfers Assistance Needed: Pt was able to walk in house w/o assistive device 3 weeks ago and has detriorated since then.  Pt has been week for last 6 months, but last 3 weeks have been a dramatic change. ADL's / Homemaking Assistance Needed: pts daugther does all cooking and cleaning.  Pt was able to do most bathing/dressing/grooming up until the last month.  Son stated her matress was "ruined" so he was not sure how long she had been having toileting issues but those may have started longer ago. Communication / Swallowing Assistance Needed: Pt very difficult to understand. Comments: pt was lethargic and not speaking  clearly on evaluation.    OT Diagnosis: Generalized weakness;Cognitive deficits;Disturbance of vision;Altered mental status   OT Problem List: Decreased strength;Decreased range of motion;Decreased activity tolerance;Impaired balance (sitting and/or standing);Impaired vision/perception;Decreased coordination;Decreased cognition;Decreased safety awareness;Decreased knowledge of use of DME or AE;Decreased knowledge of precautions;Impaired UE functional use;Increased edema   OT Treatment/Interventions: Self-care/ADL training;Therapeutic activities;DME and/or AE instruction    OT Goals(Current goals can be found in the care plan section) Acute Rehab OT Goals Patient Stated Goal: none OT Goal Formulation: With family Time For Goal Achievement: 05/11/14 Potential to Achieve Goals: Fair ADL Goals Pt Will Perform Eating: with supervision;sitting Pt Will Perform Grooming: with supervision;sitting Pt Will Transfer to Toilet: with min assist;ambulating;bedside commode Additional ADL Goal #1: Pt will sit EOB for 10 min with S to perform adls at EOB. Additional ADL Goal #2: Pt will answer basic orientation questions with 80% accuracy to increase involvement in therapy session.  OT Frequency: Min 2X/week   Barriers to D/C: Decreased caregiver support  Daughter (with whom pt lives) is moving out of the townhouse ASAP.  Family would like to pursue AL for pt at this time and need to talk to SW about this asap.       Co-evaluation              End of Session Equipment Utilized During Treatment: Oxygen Nurse Communication: Mobility status  Activity Tolerance: Patient limited by lethargy Patient left: in chair;with call bell/phone within reach  Time: [id number] OT Time Calculation (min): 35 min Charges:  OT General Charges $OT Visit: 1 Procedure OT Evaluation $Initial OT Evaluation Tier I: 1 Procedure OT Treatments $Self Care/Home Management : 8-22 mins $Therapeutic Activity: 8-22  mins G-Codes:    Hope BuddsJones, Tracker Mance Anne 04/27/2014, 10:48 AM  [phone]

## 2014-04-27 NOTE — Progress Notes (Signed)
Assumed care from Tamala BariSusan Stewart, RN. Agree with previous Rn's assessment.

## 2014-04-27 NOTE — Progress Notes (Addendum)
PROGRESS NOTE  [name] [name] ZOX:[id number] DOB: 08-25-1940 DOA: 04/26/2014 PCP: Astrid DivineGRIFFIN,ELAINE COLLINS, MD  HPI: 7 day h/o cough and congestion. 3 days ago became significantly worse. OTC cold medicines w/o benefit. Now w/ productive green sputum. Associated w/ subjective fevers and generalized weakness. Associated w/ vomiting. NO home O2.   Subjective/ 24 H Interval events Patient endorses feeling a little bit better this morning, states that her breathing is better.  Assessment/Plan: Principal Problem:   Community acquired pneumonia Active Problems:   Bipolar 1 disorder   Essential hypertension   Conjunctivitis   HLD (hyperlipidemia)   Hypothyroid   Acute kidney injury   Community Acquired Pneumonia:  - Based on clinical grounds as well as a chest x-ray with right lower lobe infiltrate - Continue levofloxacin - Strep pneumo negative, legionella antigen is in process - Blood cultures negative so far - Sputum cultures negative so far - r/o flu  AKI: Cr 1.26 on admission. Baseline 0.7. Likely secondary to dehydration.  - Creatinine improved to normal range with fluids - Continue to monitor  HTN: intermittently taking over past few days due to illness.  - continue Norvasc  Bipolar: at baseline. Pt w/ known visual and auditory hallucinations at baseline. Intermittent dosing at home over the past couple of days.  - Lithium levels elevated, hold lithium and recheck levels tomorrow morning - Carbamazepine level normal - Continue with other medications  Conjunctivitis: L eye. Likely viral as non-painful and minimal discharge  HLD: - continue home statin  Hypothyroid: - continue home Synthroid   Diet: Soft Fluids: NS DVT Prophylaxis: heparin  Code Status: Full Family Communication: none this morning  Disposition Plan: inpatient  Consultants:  None   Procedures:  None    Antibiotics Levofloxacin 11/23 >>   Studies  Dg Chest Port 1  View  04/26/2014   CLINICAL DATA:  Weakness and cough  EXAM: PORTABLE CHEST - 1 VIEW  COMPARISON:  05/15/2010  FINDINGS: Cardiac shadow is stable. The thoracic aorta is mildly tortuous. Increased density is noted in the right lung base consistent with early infiltrate. No sizable effusion is seen.  IMPRESSION: Early right basilar infiltrate.   Electronically Signed   By: Alcide CleverMark  Lukens M.D.   On: 04/26/2014 21:57    Objective  Filed Vitals:   04/26/14 2330 04/26/14 2331 04/27/14 0513 04/27/14 0538  BP:  140/70 146/42   Pulse:  66 83   Temp:  99.2 F (37.3 C) 97.7 F (36.5 C)   TempSrc:  Oral Oral   Resp:  28 22   Height: 5\' 5"  (1.651 m)     Weight: 77.7 kg (171 lb 4.8 oz)     SpO2:  92%  91%   No intake or output data in the 24 hours ending 04/27/14 0720 Filed Weights   04/26/14 2330  Weight: 77.7 kg (171 lb 4.8 oz)    Exam:  General: no apparent distress, somewhat drowsy but wakes up easily   Cardiovascular: RRR  Respiratory: good air movement, no wheezing  Abdomen: soft, non tender  MSK: no edema  Neuro: non focal  Data Reviewed: Basic Metabolic Panel:  Recent Labs Lab 04/26/14 2037 04/27/14 0504  NA 140 144  K 4.7 4.6  CL 99 106  CO2 29 28  GLUCOSE 133* 106*  BUN 23 18  CREATININE 1.26* 1.09  CALCIUM 11.2* 10.7*   Liver Function Tests:  Recent Labs Lab 04/26/14 2037  AST 34  ALT 31  ALKPHOS 137*  BILITOT <  0.2*  PROT 9.2*  ALBUMIN 3.8   CBC:  Recent Labs Lab 04/26/14 2037 04/27/14 0504  WBC 17.3* 16.0*  NEUTROABS 14.6*  --   HGB 13.1 13.2  HCT 40.9 42.8  MCV 98.6 99.8  PLT 329 273   Scheduled Meds: . amLODipine  5 mg Oral Daily  . benztropine  1 mg Oral Daily  . carbamazepine  200 mg Oral BID  . heparin  5,000 Units Subcutaneous 3 times per day  . levofloxacin (LEVAQUIN) IV  750 mg Intravenous Q24H  . levothyroxine  125 mcg Oral QAC breakfast  . lurasidone  60 mg Oral Daily  . olopatadine  1 drop Left Eye BID  . senna  1 tablet  Oral BID  . simvastatin  20 mg Oral q1800  . sodium chloride  3 mL Intravenous Q12H   Continuous Infusions: . sodium chloride 100 mL/hr at 04/27/14 0004    Time spent: 35 minutes  Pamella Pertostin Gherghe, MD [facility] Pager [phone]. If 7 PM - 7 AM, please contact night-coverage at [email or web address], password [email or web address] 04/27/2014, 7:20 AM  LOS: 1 day

## 2014-04-27 NOTE — Evaluation (Signed)
Physical Therapy Evaluation Patient Details Name: [name] [name] MRN: [id number] DOB: 13-Feb-1941 Today's Date: 04/27/2014   History of Present Illness  Pt is a 73 yo female admitted for 7 day h/o cough, weakness, congestion, fever and vomiting.  Pt with CAP.  Pt with h/o bipolar d/o, hallucinations at baseline, memory loss, HTN, diverticulitis.  Clinical Impression  On eval, pt required Mod assist +2 for mobility-able to stand and take a few lateral steps [facility]. Eval limited by impaired cognition, lethargy. Unable to safely attempt ambulation this session. At this time, recommend SNF.     Follow Up Recommendations SNF    Equipment Recommendations   (to be determined)    Recommendations for Other Services OT consult     Precautions / Restrictions Precautions Precautions: Fall Precaution Comments: droplet Restrictions Weight Bearing Restrictions: No      Mobility  Bed Mobility Overal bed mobility: Needs Assistance Bed Mobility: Supine to Sit;Sit to Supine     Supine to sit: Mod assist;+2 for safety/equipment;+2 for physical assistance;HOB elevated Sit to supine: Mod assist;+2 for safety/equipment;+2 for physical assistance   General bed mobility comments: Assist for trunk and bil LEs. Increased time. Multimodal cues for safety, technique, completion of task.  Transfers Overall transfer level: Needs assistance Equipment used: Rolling walker (2 wheeled) Transfers: Sit to/from Stand Sit to Stand: Mod assist;+2 safety/equipment;From elevated surface;+2 physical assistance Stand pivot transfers: Mod assist;+2 physical assistance;+2 safety/equipment       General transfer comment: assist to rise,stabilize, control descent. Multimodal cues for safety, technique, hand placement.   Ambulation/Gait             General Gait Details: Side steps towards HOB with walker x 4. Multimodal cues for safety. Assist to move R LE each time.   Stairs             Wheelchair Mobility    Modified Rankin (Stroke Patients Only)       Balance Overall balance assessment: Needs assistance Sitting-balance support: Bilateral upper extremity supported;Feet supported Sitting balance-Leahy Scale: Poor     Standing balance support: Bilateral upper extremity supported;During functional activity Standing balance-Leahy Scale: Poor                               Pertinent Vitals/Pain Pain Assessment: No/denies pain    Home Living                        Prior Function                 Hand Dominance        Extremity/Trunk Assessment   Upper Extremity Assessment: Defer to OT evaluation           Lower Extremity Assessment: Generalized weakness;RLE deficits/detail;LLE deficits/detail;Difficult to assess due to impaired cognition RLE Deficits / Details: able to weightbear with UE support. Unable to officially test due to impaired cognition LLE Deficits / Details: able to weightbear with UE support. Unable to officially test due to impaired cognition  Cervical / Trunk Assessment: Normal  Communication      Cognition Arousal/Alertness: Lethargic   Overall Cognitive Status: No family/caregiver present to determine baseline cognitive functioning Area of Impairment: Memory;Following commands;Problem solving;Safety/judgement   Current Attention Level: Focused Memory: Decreased short-term memory;Decreased recall of precautions Following Commands: Follows one step commands inconsistently Safety/Judgement: Decreased awareness of deficits;Decreased awareness of safety   Problem Solving: Slow processing;Decreased  initiation;Requires verbal cues      General Comments      Exercises        Assessment/Plan    PT Assessment Patient needs continued PT services  PT Diagnosis Difficulty walking;Generalized weakness;Abnormality of gait;Altered mental status   PT Problem List Decreased strength;Decreased range of  motion;Decreased activity tolerance;Decreased balance;Decreased cognition;Decreased coordination;Decreased mobility;Decreased safety awareness;Decreased knowledge of use of DME  PT Treatment Interventions DME instruction;Gait training;Functional mobility training;Therapeutic activities;Patient/family education;Balance training;Therapeutic exercise   PT Goals (Current goals can be found in the Care Plan section) Acute Rehab PT Goals Patient Stated Goal: none PT Goal Formulation: Patient unable to participate in goal setting Time For Goal Achievement: 05/11/14 Potential to Achieve Goals: Fair    Frequency Min 3X/week   Barriers to discharge        Co-evaluation               End of Session Equipment Utilized During Treatment: Gait belt Activity Tolerance: Patient limited by fatigue;Patient limited by lethargy Patient left: in bed;with call bell/phone within reach;with bed alarm set           Time: 1419-1431 PT Time Calculation (min) (ACUTE ONLY): 12 min   Charges:   PT Evaluation $Initial PT Evaluation Tier I: 1 Procedure PT Treatments $Therapeutic Activity: 8-22 mins   PT G Codes:          Rebeca AlertJannie Keatyn Luck, MPT Pager: [phone]

## 2014-04-27 NOTE — Progress Notes (Signed)
CRITICAL VALUE ALERT  Critical value received:  Litihium 1.68  Date of notification:  04/27/2014  Time of notification:  0609  Critical value read back:Yes.    Nurse who received alert:  J.Marvens Hollars  MD notified (1st page):  K.Schorr  Time of first page:  0610  MD notified (2nd page):  Time of second page:  Responding MD:  K.schorr  Time MD responded:  [phone]

## 2014-04-27 NOTE — Progress Notes (Signed)
Utilization Review completed.  Kylee Nardozzi RN CM  

## 2014-04-28 DIAGNOSIS — F319 Bipolar disorder, unspecified: Secondary | ICD-10-CM

## 2014-04-28 DIAGNOSIS — J189 Pneumonia, unspecified organism: Principal | ICD-10-CM

## 2014-04-28 LAB — CBC
HEMATOCRIT: 41.9 % (ref 36.0–46.0)
Hemoglobin: 12.5 g/dL (ref 12.0–15.0)
MCH: 30.5 pg (ref 26.0–34.0)
MCHC: 29.8 g/dL — ABNORMAL LOW (ref 30.0–36.0)
MCV: 102.2 fL — AB (ref 78.0–100.0)
Platelets: 274 10*3/uL (ref 150–400)
RBC: 4.1 MIL/uL (ref 3.87–5.11)
RDW: 13.4 % (ref 11.5–15.5)
WBC: 17.5 10*3/uL — AB (ref 4.0–10.5)

## 2014-04-28 LAB — BASIC METABOLIC PANEL
Anion gap: 12 (ref 5–15)
BUN: 15 mg/dL (ref 6–23)
CHLORIDE: 113 meq/L — AB (ref 96–112)
CO2: 27 mEq/L (ref 19–32)
CREATININE: 0.98 mg/dL (ref 0.50–1.10)
Calcium: 10.9 mg/dL — ABNORMAL HIGH (ref 8.4–10.5)
GFR calc Af Amer: 65 mL/min — ABNORMAL LOW (ref >90)
GFR calc non Af Amer: 56 mL/min — ABNORMAL LOW (ref >90)
Glucose, Bld: 110 mg/dL — ABNORMAL HIGH (ref 70–99)
Potassium: 4.7 mEq/L (ref 3.7–5.3)
Sodium: 152 mEq/L — ABNORMAL HIGH (ref 137–147)

## 2014-04-28 LAB — LEGIONELLA ANTIGEN, URINE

## 2014-04-28 LAB — CARBAMAZEPINE LEVEL, TOTAL: Carbamazepine Lvl: 8.8 ug/mL (ref 4.0–12.0)

## 2014-04-28 LAB — CG4 I-STAT (LACTIC ACID): LACTIC ACID, VENOUS: 0.99 mmol/L (ref 0.5–2.2)

## 2014-04-28 LAB — EXPECTORATED SPUTUM ASSESSMENT W GRAM STAIN, RFLX TO RESP C

## 2014-04-28 LAB — URINE CULTURE
COLONY COUNT: NO GROWTH
Culture: NO GROWTH

## 2014-04-28 LAB — LITHIUM LEVEL: Lithium Lvl: 0.91 mEq/L (ref 0.80–1.40)

## 2014-04-28 MED ORDER — FLUCONAZOLE 200 MG PO TABS
200.0000 mg | ORAL_TABLET | Freq: Once | ORAL | Status: AC
  Administered 2014-04-28: 200 mg via ORAL
  Filled 2014-04-28: qty 1

## 2014-04-28 MED ORDER — LEVOFLOXACIN 750 MG PO TABS
750.0000 mg | ORAL_TABLET | Freq: Once | ORAL | Status: AC
  Administered 2014-04-28: 750 mg via ORAL
  Filled 2014-04-28: qty 1

## 2014-04-28 MED ORDER — LEVOFLOXACIN 750 MG PO TABS
750.0000 mg | ORAL_TABLET | Freq: Every day | ORAL | Status: DC
  Administered 2014-04-29 – 2014-05-02 (×4): 750 mg via ORAL
  Filled 2014-04-28 (×4): qty 1

## 2014-04-28 MED ORDER — NYSTATIN 100000 UNIT/ML MT SUSP
5.0000 mL | Freq: Four times a day (QID) | OROMUCOSAL | Status: DC
  Administered 2014-04-28 – 2014-05-03 (×20): 500000 [IU] via ORAL
  Filled 2014-04-28 (×23): qty 5

## 2014-04-28 MED ORDER — FREE WATER
200.0000 mL | Freq: Three times a day (TID) | Status: DC
  Administered 2014-04-28 – 2014-05-03 (×15): 200 mL via ORAL

## 2014-04-28 MED ORDER — INFLUENZA VAC SPLIT QUAD 0.5 ML IM SUSY
0.5000 mL | PREFILLED_SYRINGE | INTRAMUSCULAR | Status: AC
  Administered 2014-04-29: 0.5 mL via INTRAMUSCULAR
  Filled 2014-04-28 (×2): qty 0.5

## 2014-04-28 NOTE — Progress Notes (Signed)
Physical Therapy Treatment Patient Details Name: [name] [name] MRN: [id number] DOB: 09/22/1940 Today's Date: 04/28/2014    History of Present Illness Pt is a 73 yo female admitted for 7 day h/o cough, weakness, congestion, fever and vomiting.  Pt with CAP.  Pt with h/o bipolar d/o, hallucinations at baseline, memory loss, HTN, diverticulitis.    PT Comments    Pt was able to initiate gait training this tx of 100 feet in hallway.  Pt is min A for all mobility.  Pt is unsteady and unsafe with gait this tx.    Follow Up Recommendations  SNF     Equipment Recommendations       Recommendations for Other Services OT consult     Precautions / Restrictions Precautions Precautions: Fall Restrictions Weight Bearing Restrictions: No    Mobility  Bed Mobility Overal bed mobility: Needs Assistance Bed Mobility: Supine to Sit     Supine to sit: Min assist     General bed mobility comments: A with LE's to EOB; manual A with bed pad for forward scoot   Transfers Overall transfer level: Needs assistance Equipment used: Rolling walker (2 wheeled) Transfers: Sit to/from Stand Sit to Stand: Min assist         General transfer comment: VCs for hand placement and sequencing; increased time for stabilization  Ambulation/Gait Ambulation/Gait assistance: Min assist;+2 safety/equipment Ambulation Distance (Feet): 100 Feet Assistive device: Rolling walker (2 wheeled) Gait Pattern/deviations: Step-through pattern;Decreased stride length;Decreased weight shift to left     General Gait Details: min tactile cues for RW management; pt unsteady with moderate R side lean; limited use of R UE   Stairs            Wheelchair Mobility    Modified Rankin (Stroke Patients Only)       Balance                                    Cognition Arousal/Alertness: Awake/alert Behavior During Therapy: Flat affect Overall Cognitive Status: No family/caregiver present to  determine baseline cognitive functioning                      Exercises      General Comments        Pertinent Vitals/Pain Pain Assessment: No/denies pain    Home Living                      Prior Function            PT Goals (current goals can now be found in the care plan section) Progress towards PT goals: Progressing toward goals    Frequency  Min 3X/week    PT Plan Current plan remains appropriate    Co-evaluation             End of Session Equipment Utilized During Treatment: Gait belt Activity Tolerance: Patient tolerated treatment well Patient left: with call bell/phone within reach;in chair;with chair alarm set     Time: [id number] PT Time Calculation (min) (ACUTE ONLY): 25 min  Charges:                       G Codes:      Miller,Derrick, SPTA 04/28/2014, 4:14 PM   Reviewed above  Felecia ShellingLori Shakeitha Umbaugh  PTA [facility]  Acute  Rehab Pager      [phone]

## 2014-04-28 NOTE — Discharge Summary (Signed)
Physician Discharge Summary  [name] [name] [id number] DOB: 23-Jun-1940 DOA: 04/26/2014  PCP: Astrid DivineGRIFFIN,ELAINE COLLINS, MD  Admit date: 04/26/2014 Discharge date: 04/28/2014  Time spent:  minutes  Recommendations for Outpatient Follow-up:   Discharge Diagnoses:  Principal Problem:   Community acquired pneumonia Active Problems:   Bipolar 1 disorder   Essential hypertension   Conjunctivitis   HLD (hyperlipidemia)   Hypothyroid   Acute kidney injury   Discharge Condition:   Diet recommendation: regular  Filed Weights   04/26/14 2330  Weight: 77.7 kg (171 lb 4.8 oz)    History of present illness:  7 day h/o cough and congestion. 3 days ago became significantly worse. OTC cold medicines w/o benefit. Now w/ productive green sputum. Associated w/ subjective fevers and generalized weakness. Associated w/ vomiting. NO home O2.   Hospital Course:   Community Acquired Pneumonia:  - Based on clinical grounds as well as a chest x-ray with right lower lobe infiltrate - Continue levofloxacin - Strep pneumo negative, legionella antigen is in process - Blood cultures negative so far - Sputum cultures negative so far Flu negative.   AKI: Cr 1.26 on admission. Baseline 0.7. Likely secondary to dehydration.  - Creatinine improved to normal range with fluids - Continue to monitor  HTN: intermittently taking over past few days due to illness.  - continue Norvasc  Bipolar: at baseline. Pt w/ known visual and auditory hallucinations at baseline. Intermittent dosing at home over the past couple of days.  - Carbamazepine level normal - Continue with other medications  Conjunctivitis: L eye. Likely viral as non-painful and minimal discharge  HLD: - continue home statin  Hypothyroid: - continue home Synthroid   Procedures:  none  Consultations:  none  Discharge Exam: Filed Vitals:   04/28/14 0646  BP: 161/86  Pulse:   Temp:   Resp:     General: alert afebrile  comfortable Cardiovascular: s1s2 Respiratory: ctab  Discharge Instructions You were cared for by a hospitalist during your hospital stay. If you have any questions about your discharge medications or the care you received while you were in the hospital after you are discharged, you can call the unit and asked to speak with the hospitalist on call if the hospitalist that took care of you is not available. Once you are discharged, your primary care physician will handle any further medical issues. Please note that NO REFILLS for any discharge medications will be authorized once you are discharged, as it is imperative that you return to your primary care physician (or establish a relationship with a primary care physician if you do not have one) for your aftercare needs so that they can reassess your need for medications and monitor your lab values.   Current Discharge Medication List    CONTINUE these medications which have NOT CHANGED   Details  amLODipine (NORVASC) 5 MG tablet Take 5 mg by mouth daily.    benztropine (COGENTIN) 1 MG tablet Take 1 mg by mouth daily.    carbamazepine (TEGRETOL) 200 MG tablet Take 200 mg by mouth 2 (two) times daily.    guaiFENesin-dextromethorphan (ROBITUSSIN DM) 100-10 MG/5ML syrup Take 5 mLs by mouth every 4 (four) hours as needed for cough.    levothyroxine (SYNTHROID, LEVOTHROID) 125 MCG tablet Take 125 mcg by mouth daily before breakfast.    lithium 300 MG tablet Take 300-600 mg by mouth 2 (two) times daily. 300 mg in the am and 600 mg in the pm    Lurasidone HCl (  LATUDA) 60 MG TABS Take 60 mg by mouth daily.    Pheniramine-PE-APAP (THERAFLU FLU & SORE THROAT) 20-10-650 MG PACK Take 1 Package by mouth every 6 (six) hours as needed (cough).    simvastatin (ZOCOR) 20 MG tablet Take 20 mg by mouth daily at 6 PM.       Allergies  Allergen Reactions  . Antihistamine Decongestant [Triprolidine-Pse] Diarrhea  . Codeine Other (See Comments)    Gi  upset       The results of significant diagnostics from this hospitalization (including imaging, microbiology, ancillary and laboratory) are listed below for reference.    Significant Diagnostic Studies: Dg Chest Port 1 View  04/26/2014   CLINICAL DATA:  Weakness and cough  EXAM: PORTABLE CHEST - 1 VIEW  COMPARISON:  05/15/2010  FINDINGS: Cardiac shadow is stable. The thoracic aorta is mildly tortuous. Increased density is noted in the right lung base consistent with early infiltrate. No sizable effusion is seen.  IMPRESSION: Early right basilar infiltrate.   Electronically Signed   By: Alcide CleverMark  Lukens M.D.   On: 04/26/2014 21:57    Microbiology: Recent Results (from the past 240 hour(s))  Urine culture     Status: None   Collection Time: 04/26/14  9:06 PM  Result Value Ref Range Status   Specimen Description URINE, CLEAN CATCH  Final   Special Requests NONE  Final   Culture  Setup Time   Final    04/27/2014 06:19 Performed at [facility]    Colony Count NO GROWTH Performed at [facility]   Final   Culture NO GROWTH Performed at [facility]   Final   Report Status 04/28/2014 FINAL  Final  Culture, sputum-assessment     Status: None   Collection Time: 04/28/14  9:00 AM  Result Value Ref Range Status   Specimen Description SPUTUM  Final   Special Requests NONE  Final   Sputum evaluation   Final    THIS SPECIMEN IS ACCEPTABLE. RESPIRATORY CULTURE REPORT TO FOLLOW.   Report Status 04/28/2014 FINAL  Final     Labs: Basic Metabolic Panel:  Recent Labs Lab 04/26/14 2037 04/27/14 0504 04/28/14 0455  NA 140 144 152*  K 4.7 4.6 4.7  CL 99 106 113*  CO2 29 28 27   GLUCOSE 133* 106* 110*  BUN 23 18 15   CREATININE 1.26* 1.09 0.98  CALCIUM 11.2* 10.7* 10.9*   Liver Function Tests:  Recent Labs Lab 04/26/14 2037  AST 34  ALT 31  ALKPHOS 137*  BILITOT <0.2*  PROT 9.2*  ALBUMIN 3.8   No results for input(s): LIPASE, AMYLASE in the last 168  hours. No results for input(s): AMMONIA in the last 168 hours. CBC:  Recent Labs Lab 04/26/14 2037 04/27/14 0504 04/28/14 0455  WBC 17.3* 16.0* 17.5*  NEUTROABS 14.6*  --   --   HGB 13.1 13.2 12.5  HCT 40.9 42.8 41.9  MCV 98.6 99.8 102.2*  PLT 329 273 274   Cardiac Enzymes: No results for input(s): CKTOTAL, CKMB, CKMBINDEX, TROPONINI in the last 168 hours. BNP: BNP (last 3 results) No results for input(s): PROBNP in the last 8760 hours. CBG: No results for input(s): GLUCAP in the last 168 hours.     SignedKathlen Mody:  Mantaj Chamberlin  [facility] 04/28/2014, 2:54 PM

## 2014-04-28 NOTE — Progress Notes (Signed)
Patient has a bed at [name] when ready for discharge.   Clinical Social Work Department CLINICAL SOCIAL WORK PLACEMENT NOTE 04/28/2014  Patient:  [name],[name]  Account Number:  [id number] Admit date:  04/26/2014  Clinical Social Worker:  Verona Hartshorn FOLEY, LCSWA  Date/time:  04/28/2014 11:42 AM  Clinical Social Work is seeking post-discharge placement for this patient at the following level of care:   SKILLED NURSING   (*CSW will update this form in Epic as items are completed)   04/28/2014  Patient/family provided with [facility] Department of Clinical Social Work's list of facilities offering this level of care within the geographic area requested by the patient (or if unable, by the patient's family).  04/28/2014  Patient/family informed of their freedom to choose among providers that offer the needed level of care, that participate in Medicare, Medicaid or managed care program needed by the patient, have an available bed and are willing to accept the patient.  04/28/2014  Patient/family informed of [facility]' ownership interest in [facility], as well as of the fact that they are under no obligation to receive care at this facility.  PASARR submitted to EDS on 04/28/2014 PASARR number received on 04/28/2014  FL2 transmitted to all facilities in geographic area requested by pt/family on  04/28/2014 FL2 transmitted to all facilities within larger geographic area on   Patient informed that his/her managed care company has contracts with or will negotiate with  certain facilities, including the following:     Patient/family informed of bed offers received:  04/28/2014 Patient chooses bed at [name] Physician recommends and patient chooses bed at    Patient to be transferred to [name] on   Patient to be transferred to facility by  Patient and family notified of transfer on  Name of family member notified:    The following physician  request were entered in Epic:   Additional Comments:   Toccara Alford, LCSW [facility] Clinical Social Worker cell #: [phone]  

## 2014-04-28 NOTE — Progress Notes (Signed)
Clinical Social Work Department BRIEF PSYCHOSOCIAL ASSESSMENT 04/28/2014  Patient:  [name] [name],[name] [name]     Account Number:  [id number]     Admit date:  04/26/2014  Clinical Social Worker:  FOLEY,Nida Manfredi, LCSWA  Date/Time:  04/28/2014 10:37 AM  Referred by:  Physician  Date Referred:  04/28/2014 Referred for  SNF Placement   Other Referral:   Interview type:  Family Other interview type:   patient's son, [name] via phone    PSYCHOSOCIAL DATA Living Status:  WITH ADULT CHILDREN Admitted from facility:   Level of care:   Primary support name:  [name] [name] (son) c#: [phone] Primary support relationship to patient:  CHILD, ADULT Degree of support available:   good    CURRENT CONCERNS Current Concerns  Post-Acute Placement   Other Concerns:    SOCIAL WORK ASSESSMENT / PLAN CSW received consult for SNF placement.   Assessment/plan status:  Information/Referral to Community Resources Other assessment/ plan:   Information/referral to community resources:   CSW completed FL2 and faxed information out to [place] SNFs - provided bed offers to son.    PATIENT'S/FAMILY'S RESPONSE TO PLAN OF CARE: Patient's son is agreeable with plan for SNF, accepted bed at [name]. CSW confirmed with Melissa @ SNF that they would be able to take patient when ready.       Samona Chihuahua, LCSW [facility] Clinical Social Worker cell #: [phone]  

## 2014-04-28 NOTE — Progress Notes (Signed)
Patient has no IV access, after number of attempts by IV team and myself. Patient is a very hard stick. Levaquin po given since IV levaquin cant be given tonight, Kirtland BouchardK. Schorr NP was made aware.

## 2014-04-29 DIAGNOSIS — I10 Essential (primary) hypertension: Secondary | ICD-10-CM

## 2014-04-29 DIAGNOSIS — J189 Pneumonia, unspecified organism: Secondary | ICD-10-CM | POA: Diagnosis not present

## 2014-04-29 LAB — BASIC METABOLIC PANEL
ANION GAP: 11 (ref 5–15)
BUN: 17 mg/dL (ref 6–23)
CALCIUM: 10.6 mg/dL — AB (ref 8.4–10.5)
CO2: 30 mEq/L (ref 19–32)
CREATININE: 1.04 mg/dL (ref 0.50–1.10)
Chloride: 110 mEq/L (ref 96–112)
GFR, EST AFRICAN AMERICAN: 60 mL/min — AB (ref >90)
GFR, EST NON AFRICAN AMERICAN: 52 mL/min — AB (ref >90)
Glucose, Bld: 118 mg/dL — ABNORMAL HIGH (ref 70–99)
Potassium: 4.4 mEq/L (ref 3.7–5.3)
Sodium: 151 mEq/L — ABNORMAL HIGH (ref 137–147)

## 2014-04-29 MED ORDER — DEXTROSE-NACL 5-0.45 % IV SOLN
INTRAVENOUS | Status: DC
  Administered 2014-04-29: 50 mL/h via INTRAVENOUS
  Administered 2014-04-30: 07:00:00 via INTRAVENOUS

## 2014-04-29 NOTE — Plan of Care (Signed)
Problem: Phase I Progression Outcomes Goal: Hemodynamically stable Outcome: Completed/Met Date Met:  04/29/14 Goal: Other Phase I Outcomes/Goals Outcome: Completed/Met Date Met:  04/29/14  Problem: Phase II Progression Outcomes Goal: Progress activity as tolerated unless otherwise ordered Outcome: Completed/Met Date Met:  04/29/14 Goal: IV changed to normal saline lock Outcome: Completed/Met Date Met:  04/29/14 Goal: Obtain order to discontinue catheter if appropriate Outcome: Not Applicable Date Met:  25/74/93 Goal: Other Phase II Outcomes/Goals Outcome: Completed/Met Date Met:  04/29/14

## 2014-04-29 NOTE — Evaluation (Signed)
SLP Cancellation Note  Patient Details Name: [name] [name] MRN: [id number] DOB: 12/30/1940   Cancelled treatment:       Reason Eval/Treat Not Completed: Fatigue/lethargy limiting ability to participate   Donavan Burnetamara [name], MS [facility] SLP [phone]

## 2014-04-29 NOTE — Progress Notes (Signed)
PROGRESS NOTE  [name] [name] [id number] DOB: 03/21/1941 DOA: 04/26/2014 PCP: Astrid DivineGRIFFIN,ELAINE COLLINS, MD  HPI: 7 day h/o cough and congestion. 3 days ago became significantly worse. OTC cold medicines w/o benefit. Now w/ productive green sputum. Associated w/ subjective fevers and generalized weakness. Associated w/ vomiting. NO home O2.   Subjective/ 24 H Interval events Patient endorses feeling a little bit better this morning, states that her breathing is better.  Assessment/Plan: Principal Problem:   Community acquired pneumonia Active Problems:   Bipolar 1 disorder   Essential hypertension   Conjunctivitis   HLD (hyperlipidemia)   Hypothyroid   Acute kidney injury   Community Acquired Pneumonia:  - Based on clinical grounds as well as a chest x-ray with right lower lobe infiltrate - Continue levofloxacin - Strep pneumo negative, legionella antigen negative.  - Blood cultures negative so far - Sputum cultures negative so far -flu PCR negative.   AKI: Cr 1.26 on admission. Baseline 0.7. Likely secondary to dehydration.  - Creatinine improved to normal range with fluids - Continue to monitor  HTN: intermittently taking over past few days due to illness.  - continue Norvasc  Bipolar: at baseline. Pt w/ known visual and auditory hallucinations at baseline. Intermittent dosing at home over the past couple of days.  - Lithium levels elevated, held lithium and repeat levels therapeutic. Resume lithium from tomorrow.  - Carbamazepine level normal - Continue with other medications  Conjunctivitis: L eye. Likely viral as non-painful and minimal discharge  HLD: - continue home statin  Hypothyroid: - continue home Synthroid  Hypernatremia: Encourage free water intake orally and started her on dextrose 0.45 NS fluids.    Diet: Soft Fluids: NS DVT Prophylaxis: heparin  Code Status: Full Family Communication: none this morning  Disposition Plan:  inpatient  Consultants:  None   Procedures:  None    Antibiotics Levofloxacin 11/23 >>   Studies  No results found.  Objective  Filed Vitals:   04/28/14 1822 04/28/14 2256 04/29/14 0446 04/29/14 1446  BP:  131/67 150/70 142/61  Pulse:  74 76 87  Temp:  98.4 F (36.9 C) 98.8 F (37.1 C) 98 F (36.7 C)  TempSrc:  Oral Oral Oral  Resp:  20 20 19   Height:      Weight:      SpO2: 96% 95% 95% 98%    Intake/Output Summary (Last 24 hours) at 04/29/14 1509 Last data filed at 04/29/14 1506  Gross per 24 hour  Intake   1306 ml  Output      0 ml  Net   1306 ml   Filed Weights   04/26/14 2330  Weight: 77.7 kg (171 lb 4.8 oz)    Exam:  General: no apparent distress, somewhat drowsy but wakes up easily   Cardiovascular: RRR  Respiratory: good air movement, no wheezing  Abdomen: soft, non tender  MSK: no edema  Neuro: non focal  Data Reviewed: Basic Metabolic Panel:  Recent Labs Lab 04/26/14 2037 04/27/14 0504 04/28/14 0455 04/29/14 0545  NA 140 144 152* 151*  K 4.7 4.6 4.7 4.4  CL 99 106 113* 110  CO2 29 28 27 30   GLUCOSE 133* 106* 110* 118*  BUN 23 18 15 17   CREATININE 1.26* 1.09 0.98 1.04  CALCIUM 11.2* 10.7* 10.9* 10.6*   Liver Function Tests:  Recent Labs Lab 04/26/14 2037  AST 34  ALT 31  ALKPHOS 137*  BILITOT <0.2*  PROT 9.2*  ALBUMIN 3.8   CBC:  Recent Labs Lab 04/26/14 2037 04/27/14 0504 04/28/14 0455  WBC 17.3* 16.0* 17.5*  NEUTROABS 14.6*  --   --   HGB 13.1 13.2 12.5  HCT 40.9 42.8 41.9  MCV 98.6 99.8 102.2*  PLT 329 273 274   Scheduled Meds: . amLODipine  5 mg Oral Daily  . benztropine  1 mg Oral Daily  . carbamazepine  200 mg Oral BID  . free water  200 mL Oral 3 times per day  . heparin  5,000 Units Subcutaneous 3 times per day  . levofloxacin  750 mg Oral Daily  . levothyroxine  125 mcg Oral QAC breakfast  . lurasidone  60 mg Oral Daily  . nystatin  5 mL Oral QID  . olopatadine  1 drop Left Eye BID  .  senna  1 tablet Oral BID  . simvastatin  20 mg Oral q1800  . sodium chloride  3 mL Intravenous Q12H   Continuous Infusions: . dextrose 5 % and 0.45% NaCl 50 mL/hr (04/29/14 1240)    Time spent: 15 minutes  Kathlen ModyVijaya Gaia Gullikson, MD [facility] Pager [phone] . If 7 PM - 7 AM, please contact night-coverage at [email or web address], password [email or web address] 04/29/2014, 3:09 PM  LOS: 3 days

## 2014-04-30 ENCOUNTER — Inpatient Hospital Stay (HOSPITAL_COMMUNITY): Payer: PRIVATE HEALTH INSURANCE

## 2014-04-30 LAB — BASIC METABOLIC PANEL
Anion gap: 10 (ref 5–15)
BUN: 17 mg/dL (ref 6–23)
CO2: 28 mEq/L (ref 19–32)
Calcium: 10.1 mg/dL (ref 8.4–10.5)
Chloride: 114 mEq/L — ABNORMAL HIGH (ref 96–112)
Creatinine, Ser: 0.99 mg/dL (ref 0.50–1.10)
GFR, EST AFRICAN AMERICAN: 64 mL/min — AB (ref >90)
GFR, EST NON AFRICAN AMERICAN: 55 mL/min — AB (ref >90)
GLUCOSE: 124 mg/dL — AB (ref 70–99)
POTASSIUM: 4.4 meq/L (ref 3.7–5.3)
SODIUM: 152 meq/L — AB (ref 137–147)

## 2014-04-30 LAB — CULTURE, RESPIRATORY W GRAM STAIN: Culture: NORMAL

## 2014-04-30 LAB — CBC
HCT: 39.9 % (ref 36.0–46.0)
Hemoglobin: 11.9 g/dL — ABNORMAL LOW (ref 12.0–15.0)
MCH: 30.5 pg (ref 26.0–34.0)
MCHC: 29.8 g/dL — AB (ref 30.0–36.0)
MCV: 102.3 fL — ABNORMAL HIGH (ref 78.0–100.0)
Platelets: 275 10*3/uL (ref 150–400)
RBC: 3.9 MIL/uL (ref 3.87–5.11)
RDW: 13.4 % (ref 11.5–15.5)
WBC: 13.5 10*3/uL — ABNORMAL HIGH (ref 4.0–10.5)

## 2014-04-30 LAB — CULTURE, RESPIRATORY

## 2014-04-30 MED ORDER — DEXTROSE 5 % IV SOLN
INTRAVENOUS | Status: DC
  Administered 2014-04-30: 14:00:00 via INTRAVENOUS

## 2014-04-30 MED ORDER — LITHIUM CARBONATE 300 MG PO CAPS
600.0000 mg | ORAL_CAPSULE | Freq: Every evening | ORAL | Status: DC
  Administered 2014-04-30 – 2014-05-02 (×3): 600 mg via ORAL
  Filled 2014-04-30 (×4): qty 2

## 2014-04-30 MED ORDER — LITHIUM CARBONATE 300 MG PO CAPS
300.0000 mg | ORAL_CAPSULE | Freq: Every morning | ORAL | Status: DC
  Administered 2014-04-30 – 2014-05-03 (×4): 300 mg via ORAL
  Filled 2014-04-30 (×4): qty 1

## 2014-04-30 MED ORDER — RESOURCE THICKENUP CLEAR PO POWD
ORAL | Status: DC | PRN
  Filled 2014-04-30: qty 125

## 2014-04-30 NOTE — Plan of Care (Signed)
Problem: Phase III Progression Outcomes Goal: Voiding independently Outcome: Completed/Met Date Met:  04/30/14     

## 2014-04-30 NOTE — Progress Notes (Signed)
Pt will drink extreme amounts of water if not monitored.  Also noted to cough frequently after po liquid intake.  Will follow aspiration guidelines.  Day RN reported same during report.  Do you want any restrictions to fluids?

## 2014-04-30 NOTE — Evaluation (Signed)
Clinical/Bedside Swallow Evaluation Patient Details  Name: [name] [name] MRN: [id number] Date of Birth: [birth year]  Today's Date: 04/30/2014 Time: [id number] SLP Time Calculation (min) (ACUTE ONLY): 42 min  Past Medical History:  Past Medical History  Diagnosis Date  . Hypertension   . Bipolar 1 disorder   . Hypothyroidism   . Memory difficulties   . HLD (hyperlipidemia)   . Diverticulitis large intestine    Past Surgical History: History reviewed. No pertinent past surgical history. HPI:  73 [facility] - with 7 days of cough, weakness and congestion-productive to green sputum.  She also experienced vomiting prior to admission.   PMH + for Bipolar disorder, essential HTN, acute kidney injury.  CXR 11/27 shlowed Mild bibasilar opacification right worse than left as well as possible increased density upper lobes that are worse than prior, possibly infection.  RN documented pt is impulsive with liquids and frequently coughs on water.   Swallow eval ordered.    Assessment / Plan / Recommendation Clinical Impression  Suspect pt with component of chronic dysphagia given her report of frequent coughing/choking on thin liquids even prior to admission.  No focal CN deficits but pt is dysarthric.  Although pt did not overtly aspirate/cough with thin liquids during her evaluation, as pt with premorbid dysphagia and current dysarthria suspect episodic aspiration.  Aspiration likely due to premature spillage of liquids into larynx/trachea.  In addition, RN reports coughing with thin water today and results of CXR are worsened therefore will modify liquids to nectar for meals when more likely to aspirate.    Note Md desire to push liquids per progress note, therefore recommend thin water be allowed only between meals.    SLP to follow up for tolerance, readiness for advancement or indication for instrumental testing.  Educated pt/RN and Psychologist, sport and exercisenurse tech to recommendations.  Using  teach back, educated pt to findings, recommendations and clinical reasoning.       Aspiration Risk  Moderate    Diet Recommendation Dysphagia 3 (Mechanical Soft);Nectar-thick liquid;Free water protocol after oral care   Liquid Administration via: Cup;Straw Medication Administration: Whole meds with puree Supervision: Patient able to self feed;Intermittent supervision to cue for compensatory strategies Compensations: Slow rate;Small sips/bites Postural Changes and/or Swallow Maneuvers: Seated upright 90 degrees;Upright 30-60 min after meal    Other  Recommendations Oral Care Recommendations: Oral care BID Other Recommendations: Order thickener from pharmacy   Follow Up Recommendations   (tbd)    Frequency and Duration min 1 x/week  1 week   Pertinent Vitals/Pain Afebrile, decreased      Swallow Study Prior Functional Status   see HHX    General Date of Onset: 04/30/14 HPI: 73 [facility] - with 7 days of cough, weakness and congestion-productive to green sputum.  She also experienced vomiting prior to admission.   PMH + for Bipolar disorder, essential HTN, acute kidney injury.  CXR 11/27 shlowed Mild bibasilar opacification right worse than left as well as possible increased density upper lobes that are worse than prior, possibly infection.  RN documented pt is impulsive with liquids and frequently coughs on water.   Swallow eval ordered.  Type of Study: Bedside swallow evaluation Diet Prior to this Study: Regular;Thin liquids Temperature Spikes Noted: No Respiratory Status: Room air History of Recent Intubation: No Behavior/Cognition: Alert;Cooperative;Pleasant mood Oral Cavity - Dentition: Edentulous Self-Feeding Abilities: Able to feed self Patient Positioning: Upright in chair Baseline Vocal Quality: Clear Volitional Cough:  Strong Volitional Swallow: Able to elicit    Oral/Motor/Sensory Function Overall Oral Motor/Sensory Function: Appears within  functional limits for tasks assessed (no focal cn deficits)   Ice Chips Ice chips: Within functional limits Presentation: Spoon   Thin Liquid Thin Liquid: Impaired Oral Phase Impairments: Reduced lingual movement/coordination Pharyngeal  Phase Impairments: Suspected delayed Swallow    Nectar Thick Nectar Thick Liquid: Within functional limits Presentation: Straw;Cup;Self Fed   Honey Thick Honey Thick Liquid: Not tested   Puree Puree: Within functional limits Presentation: Self Fed;Spoon   Solid   GO    Solid: Impaired Oral Phase Impairments: Impaired anterior to posterior transit;Reduced lingual movement/coordination Oral Phase Functional Implications: Other (comment) (delayed oral transiting) Pharyngeal Phase Impairments: Suspected delayed Fredric MareSwallow       Dvonte Gatliff, MS [facility] SLP [phone]

## 2014-04-30 NOTE — Progress Notes (Signed)
Physical Therapy Treatment Patient Details Name: [name] [name] MRN: [id number] DOB: [birth year] Today's Date: 04/30/2014    History of Present Illness Pt is a 73 yo female admitted for 7 day h/o cough, weakness, congestion, fever and vomiting.  Pt with CAP.  Pt with h/o bipolar d/o, hallucinations at baseline, memory loss, HTN, diverticulitis.    PT Comments    Pt more groggy and required increased assist for mobility.    Follow Up Recommendations  SNF     Equipment Recommendations       Recommendations for Other Services       Precautions / Restrictions Precautions Precautions: Fall Restrictions Weight Bearing Restrictions: No    Mobility  Bed Mobility Overal bed mobility: Needs Assistance Bed Mobility: Supine to Sit     Supine to sit: Max assist     General bed mobility comments: pt appears more groggy and slow to respond this session vs last.    Transfers Overall transfer level: Needs assistance Equipment used: Rolling walker (2 wheeled) Transfers: Sit to/from Stand Sit to Stand: +2 physical assistance;+2 safety/equipment;Max assist         General transfer comment: VCs for hand placement and sequencing; increased time for stabilization plus total assist for turn completion and hand over hand placement  Ambulation/Gait Ambulation/Gait assistance: +2 safety/equipment;Max assist Ambulation Distance (Feet): 75 Feet Assistive device: Rolling walker (2 wheeled) Gait Pattern/deviations: Step-to pattern;Step-through pattern;Staggering right Gait velocity: decreased   General Gait Details: very unsteady/drunken/groggy gait with increase R lean and limited use R UE.     Stairs            Wheelchair Mobility    Modified Rankin (Stroke Patients Only)       Balance                                    Cognition Arousal/Alertness: Awake/alert Behavior During Therapy:  (groggy)                        Exercises       General Comments        Pertinent Vitals/Pain Pain Assessment: No/denies pain    Home Living                      Prior Function            PT Goals (current goals can now be found in the care plan section) Progress towards PT goals: Progressing toward goals    Frequency  Min 3X/week    PT Plan      Co-evaluation             End of Session Equipment Utilized During Treatment: Gait belt Activity Tolerance: Patient tolerated treatment well Patient left: in chair;with call bell/phone within reach;with chair alarm set     Time: [id number] PT Time Calculation (min) (ACUTE ONLY): 26 min  Charges:  $Gait Training: 8-22 mins $Therapeutic Activity: 8-22 mins                    G Codes:       Felecia ShellingLori Senna Lape  PTA [facility]  Acute  Rehab Pager      [phone]

## 2014-04-30 NOTE — Progress Notes (Signed)
PROGRESS NOTE  [name] [name] [id number] DOB: 10-27-1940 DOA: 04/26/2014 PCP: Astrid DivineGRIFFIN,ELAINE COLLINS, MD  HPI: 7 day h/o cough and congestion. 3 days ago became significantly worse. OTC cold medicines w/o benefit. Now w/ productive green sputum. Associated w/ subjective fevers and generalized weakness. Associated w/ vomiting. NO home O2.   Subjective/ 24 H Interval events Patient endorses feeling a little bit better this morning, states that her breathing is better. She reports pain in the ankle and knees.   Assessment/Plan: Principal Problem:   Community acquired pneumonia Active Problems:   Bipolar 1 disorder   Essential hypertension   Conjunctivitis   HLD (hyperlipidemia)   Hypothyroid   Acute kidney injury   Community Acquired Pneumonia:  - Based on clinical grounds as well as a chest x-ray with right lower lobe infiltrate. Repeat CXR slightly worse than on admission.  - Continue levofloxacin - Strep pneumo negative, legionella antigen negative.  - Blood cultures negative so far - Sputum cultures negative so far -flu PCR negative.   AKI: Cr 1.26 on admission. Baseline 0.7. Likely secondary to dehydration.  - Creatinine improved to normal range with fluids - Continue to monitor  HTN: intermittently taking over past few days due to illness.  - continue Norvasc  Bipolar: at baseline. Pt w/ known visual and auditory hallucinations at baseline. Intermittent dosing at home over the past couple of days.  - Lithium levels elevated, held lithium and repeat levels therapeutic. Resume lithium from tomorrow.  - Carbamazepine level normal - Continue with other medications  Conjunctivitis: L eye. Likely viral as non-painful and minimal discharge  HLD: - continue home statin  Hypothyroid: - continue home Synthroid  Hypernatremia: Encourage free water intake orally and started her on dextrose  fluids. Repeat in am.    Diet: Soft/ dysphagia 3 diet.  Fluids: NS DVT  Prophylaxis: heparin  Code Status: Full Family Communication: none this morning  Disposition Plan: inpatient  Consultants:  None   Procedures:  None    Antibiotics Levofloxacin 11/23 >>   Studies  No results found.  Objective  Filed Vitals:   04/29/14 2124 04/30/14 0550 04/30/14 1043 04/30/14 1341  BP: 140/65 139/68 149/85 114/58  Pulse: 76 76 84 82  Temp: 98 F (36.7 C) 98.2 F (36.8 C)  98 F (36.7 C)  TempSrc: Oral Oral  Oral  Resp: 18 18  18   Height:      Weight:      SpO2: 98% 98%  98%    Intake/Output Summary (Last 24 hours) at 04/30/14 1903 Last data filed at 04/30/14 1300  Gross per 24 hour  Intake   4900 ml  Output      0 ml  Net   4900 ml   Filed Weights   04/26/14 2330  Weight: 77.7 kg (171 lb 4.8 oz)    Exam:  General: no apparent distress, somewhat drowsy but wakes up easily   Cardiovascular: RRR  Respiratory: good air movement, no wheezing  Abdomen: soft, non tender  MSK: no edema  Neuro: non focal  Data Reviewed: Basic Metabolic Panel:  Recent Labs Lab 04/26/14 2037 04/27/14 0504 04/28/14 0455 04/29/14 0545 04/30/14 0420  NA 140 144 152* 151* 152*  K 4.7 4.6 4.7 4.4 4.4  CL 99 106 113* 110 114*  CO2 29 28 27 30 28   GLUCOSE 133* 106* 110* 118* 124*  BUN 23 18 15 17 17   CREATININE 1.26* 1.09 0.98 1.04 0.99  CALCIUM 11.2* 10.7* 10.9* 10.6* 10.1  Liver Function Tests:  Recent Labs Lab 04/26/14 2037  AST 34  ALT 31  ALKPHOS 137*  BILITOT <0.2*  PROT 9.2*  ALBUMIN 3.8   CBC:  Recent Labs Lab 04/26/14 2037 04/27/14 0504 04/28/14 0455 04/30/14 0420  WBC 17.3* 16.0* 17.5* 13.5*  NEUTROABS 14.6*  --   --   --   HGB 13.1 13.2 12.5 11.9*  HCT 40.9 42.8 41.9 39.9  MCV 98.6 99.8 102.2* 102.3*  PLT 329 273 274 275   Scheduled Meds: . amLODipine  5 mg Oral Daily  . benztropine  1 mg Oral Daily  . carbamazepine  200 mg Oral BID  . free water  200 mL Oral 3 times per day  . heparin  5,000 Units  Subcutaneous 3 times per day  . levofloxacin  750 mg Oral Daily  . levothyroxine  125 mcg Oral QAC breakfast  . lithium carbonate  300 mg Oral q morning - 10a  . lithium carbonate  600 mg Oral QPM  . lurasidone  60 mg Oral Daily  . nystatin  5 mL Oral QID  . olopatadine  1 drop Left Eye BID  . senna  1 tablet Oral BID  . simvastatin  20 mg Oral q1800  . sodium chloride  3 mL Intravenous Q12H   Continuous Infusions: . dextrose 75 mL/hr at 04/30/14 1350    Time spent: 15 minutes  Kathlen ModyVijaya Jahmez Bily, MD [facility] Pager [phone] . If 7 PM - 7 AM, please contact night-coverage at [email or web address], password [email or web address] 04/30/2014, 7:03 PM  LOS: 4 days

## 2014-04-30 NOTE — Clinical Social Work Note (Signed)
CSW left message with son. Son requested update regarding patient's DC. CSW will wait for call back.   Roddie McBryant Jerimey Burridge MSW, ClayLCSWA, RuchLCASA, [phone]

## 2014-05-01 LAB — BASIC METABOLIC PANEL
Anion gap: 12 (ref 5–15)
BUN: 18 mg/dL (ref 6–23)
CALCIUM: 10.6 mg/dL — AB (ref 8.4–10.5)
CO2: 31 mEq/L (ref 19–32)
Chloride: 108 mEq/L (ref 96–112)
Creatinine, Ser: 1.09 mg/dL (ref 0.50–1.10)
GFR calc Af Amer: 57 mL/min — ABNORMAL LOW (ref >90)
GFR, EST NON AFRICAN AMERICAN: 49 mL/min — AB (ref >90)
GLUCOSE: 192 mg/dL — AB (ref 70–99)
POTASSIUM: 4.2 meq/L (ref 3.7–5.3)
SODIUM: 151 meq/L — AB (ref 137–147)

## 2014-05-01 NOTE — Plan of Care (Signed)
Problem: Phase I Progression Outcomes Goal: Pain controlled with appropriate interventions Outcome: Completed/Met Date Met:  05/01/14  Problem: Phase II Progression Outcomes Goal: Vital signs remain stable Outcome: Completed/Met Date Met:  05/01/14  Problem: Phase III Progression Outcomes Goal: Discharge plan remains appropriate-arrangements made Outcome: Completed/Met Date Met:  05/01/14

## 2014-05-01 NOTE — Progress Notes (Signed)
PROGRESS NOTE  [name] [name] BJY:[id number] DOB: 09-15-1940 DOA: 04/26/2014 PCP: Astrid DivineGRIFFIN,ELAINE COLLINS, MD  HPI: 73 year old lady from home admitted for sob and cough. She was found to have pneumonia. She was started on IV antibiotics and later on transitioned to po antibiotics. Speech and swallow eval recommended dysphagia 3 diet.   Subjective/ 73 H Interval events Patient endorses feeling a little bit better this morning, states that her breathing is better. She reports pain in the ankle and knees. She has 2 bowel movements, will send for c diff.    Assessment/Plan: Principal Problem:   Community acquired pneumonia Active Problems:   Bipolar 1 disorder   Essential hypertension   Conjunctivitis   HLD (hyperlipidemia)   Hypothyroid   Acute kidney injury   Community Acquired Pneumonia:  - Based on clinical grounds as well as a chest x-ray with right lower lobe infiltrate. Repeat CXR slightly worse than on admission.  - Continue levofloxacin - Strep pneumo negative, legionella antigen negative.  - Blood cultures negative so far - Sputum cultures negative so far -flu PCR negative.   AKI: Cr 1.26 on admission. Baseline 0.7. Likely secondary to dehydration.  - Creatinine improved to normal range with fluids - Continue to monitor  HTN: intermittently taking over past few days due to illness.  - continue Norvasc  Bipolar: at baseline. Pt w/ known visual and auditory hallucinations at baseline. Intermittent dosing at home over the past couple of days.  - Lithium levels elevated, held lithium and repeat levels therapeutic. Resume lithium . - Carbamazepine level normal - Continue with other medications  Conjunctivitis: L eye. Likely viral as non-painful and minimal discharge  HLD: - continue home statin  Hypothyroid: - continue home Synthroid  Hypernatremia: Encourage free water intake orally and started her on dextrose  fluids. Repeat in am showed mild improvement.     Diet: Soft/ dysphagia 3 diet.  Fluids: NS DVT Prophylaxis: heparin  Code Status: Full Family Communication: none this morning  Disposition Plan: inpatient  Consultants:  None   Procedures:  None    Antibiotics Levofloxacin 11/23 >>   Studies  Dg Chest Port 1 View  04/30/2014   CLINICAL DATA:  Cough, congestion, weakness and urinary frequency for 2 days.  EXAM: PORTABLE CHEST - 1 VIEW  COMPARISON:  04/26/2014 and 05/15/2010  FINDINGS: Lungs are hypoinflated with mild bibasilar opacification right worse than left slightly worse. Possible new mild opacification over the upper lungs right worse than left. Findings may be due to infection with small right effusion. Cardiomediastinal silhouette and remainder of the exam is unchanged.  IMPRESSION: Mild bibasilar opacification right worse than left as well as possible minimal airspace density over the upper lobes as findings are worse and may be due to infection. Possible small amount right pleural fluid.   Electronically Signed   By: Elberta Fortisaniel  Boyle M.D.   On: 04/30/2014 10:33    Objective  Filed Vitals:   04/30/14 1341 04/30/14 2105 05/01/14 0200 05/01/14 0546  BP: 114/58 138/75  140/70  Pulse: 82 77  74  Temp: 98 F (36.7 C) 98.1 F (36.7 C)  98.1 F (36.7 C)  TempSrc: Oral Oral  Oral  Resp: 18 18  18   Height:      Weight:      SpO2: 98% 85% 95% 97%    Intake/Output Summary (Last 24 hours) at 05/01/14 1400 Last data filed at 05/01/14 0700  Gross per 24 hour  Intake 2167.5 ml  Output  0 ml  Net 2167.5 ml   Filed Weights   04/26/14 2330  Weight: 77.7 kg (171 lb 4.8 oz)    Exam:  General: no apparent distress, somewhat drowsy but wakes up easily   Cardiovascular: RRR  Respiratory: good air movement, no wheezing  Abdomen: soft, non tender  MSK: no edema  Neuro: non focal  Data Reviewed: Basic Metabolic Panel:  Recent Labs Lab 04/27/14 0504 04/28/14 0455 04/29/14 0545 04/30/14 0420  05/01/14 0948  NA 144 152* 151* 152* 151*  K 4.6 4.7 4.4 4.4 4.2  CL 106 113* 110 114* 108  CO2 28 27 30 28 31   GLUCOSE 106* 110* 118* 124* 192*  BUN 18 15 17 17 18   CREATININE 1.09 0.98 1.04 0.99 1.09  CALCIUM 10.7* 10.9* 10.6* 10.1 10.6*   Liver Function Tests:  Recent Labs Lab 04/26/14 2037  AST 34  ALT 31  ALKPHOS 137*  BILITOT <0.2*  PROT 9.2*  ALBUMIN 3.8   CBC:  Recent Labs Lab 04/26/14 2037 04/27/14 0504 04/28/14 0455 04/30/14 0420  WBC 17.3* 16.0* 17.5* 13.5*  NEUTROABS 14.6*  --   --   --   HGB 13.1 13.2 12.5 11.9*  HCT 40.9 42.8 41.9 39.9  MCV 98.6 99.8 102.2* 102.3*  PLT 329 273 274 275   Scheduled Meds: . amLODipine  5 mg Oral Daily  . benztropine  1 mg Oral Daily  . carbamazepine  200 mg Oral BID  . free water  200 mL Oral 3 times per day  . heparin  5,000 Units Subcutaneous 3 times per day  . levofloxacin  750 mg Oral Daily  . levothyroxine  125 mcg Oral QAC breakfast  . lithium carbonate  300 mg Oral q morning - 10a  . lithium carbonate  600 mg Oral QPM  . lurasidone  60 mg Oral Daily  . nystatin  5 mL Oral QID  . olopatadine  1 drop Left Eye BID  . senna  1 tablet Oral BID  . simvastatin  20 mg Oral q1800  . sodium chloride  3 mL Intravenous Q12H   Continuous Infusions: . dextrose 75 mL/hr at 04/30/14 1350    Time spent: 74 minutes  Kathlen ModyVijaya Eliezer Khawaja, MD [facility] Pager [phone] . If 7 PM - 7 AM, please contact night-coverage at [email or web address], password [email or web address] 05/01/2014, 2:00 PM  LOS: 5 days

## 2014-05-01 NOTE — Plan of Care (Signed)
Problem: Phase III Progression Outcomes Goal: Foley discontinued Outcome: Not Applicable Date Met:  05/01/14     

## 2014-05-02 LAB — BASIC METABOLIC PANEL
Anion gap: 8 (ref 5–15)
BUN: 20 mg/dL (ref 6–23)
CO2: 33 mEq/L — ABNORMAL HIGH (ref 19–32)
Calcium: 10.2 mg/dL (ref 8.4–10.5)
Chloride: 105 mEq/L (ref 96–112)
Creatinine, Ser: 1.15 mg/dL — ABNORMAL HIGH (ref 0.50–1.10)
GFR calc Af Amer: 53 mL/min — ABNORMAL LOW (ref >90)
GFR, EST NON AFRICAN AMERICAN: 46 mL/min — AB (ref >90)
GLUCOSE: 133 mg/dL — AB (ref 70–99)
Potassium: 4.6 mEq/L (ref 3.7–5.3)
SODIUM: 146 meq/L (ref 137–147)

## 2014-05-02 MED ORDER — PIPERACILLIN-TAZOBACTAM 3.375 G IVPB
3.3750 g | Freq: Three times a day (TID) | INTRAVENOUS | Status: DC
  Administered 2014-05-02 – 2014-05-03 (×3): 3.375 g via INTRAVENOUS
  Filled 2014-05-02 (×6): qty 50

## 2014-05-02 NOTE — Progress Notes (Signed)
PROGRESS NOTE  [name] Arhoebe Watterson [id number] DOB: 01/04/1941 DOA: 04/26/2014 PCP: Astrid DivineGRIFFIN,ELAINE COLLINS, MD  HPI: 73 year old lady from home admitted for sob and cough. She was found to have pneumonia. She was started on IV antibiotics and later on transitioned to po antibiotics. Speech and swallow eval recommended dysphagia 3 diet.   Subjective/ 24 H Interval events Patient endorses feeling a little bit better this morning, states that her breathing is better. She reports pain in the ankle and knees.   Assessment/Plan: Principal Problem:   Community acquired pneumonia Active Problems:   Bipolar 1 disorder   Essential hypertension   Conjunctivitis   HLD (hyperlipidemia)   Hypothyroid   Acute kidney injury   Community Acquired Pneumonia:  - Based on clinical grounds as well as a chest x-ray with right lower lobe infiltrate. Repeat CXR slightly worse than on admission. Suspect she is continuously aspirating during meals, would change her antibiotics to zosyn.  - Strep pneumo negative, legionella antigen negative.  - Blood cultures negative so far - Sputum cultures negative so far -flu PCR negative.   AKI: Cr 1.26 on admission. Baseline 0.7. Likely secondary to dehydration.  - Creatinine improved to normal range with fluids - Continue to monitor  HTN: intermittently taking over past few days due to illness.  - continue Norvasc  Bipolar: at baseline. Pt w/ known visual and auditory hallucinations at baseline. Intermittent dosing at home over the past couple of days.  - Lithium levels elevated, held lithium and repeat levels therapeutic. Resume lithium . - Carbamazepine level normal - Continue with other medications  Conjunctivitis: L eye. Likely viral as non-painful and minimal discharge  HLD: - continue home statin  Hypothyroid: - continue home Synthroid  Hypernatremia: Encourage free water intake orally and started her on dextrose  fluids. Repeat in am showed  mild improvement.    Diet: Soft/ dysphagia 3 diet.  Fluids: NS DVT Prophylaxis: heparin  Code Status: Full Family Communication: none this morning  Disposition Plan: inpatient  Consultants:  None   Procedures:  None    Antibiotics Levofloxacin 11/23 >>   Studies  No results found.  Objective  Filed Vitals:   05/01/14 0546 05/01/14 1509 05/01/14 2214 05/02/14 0641  BP: 140/70 139/90 134/69 133/78  Pulse: 74 94 80 83  Temp: 98.1 F (36.7 C) 98.4 F (36.9 C) 99.3 F (37.4 C) 99.3 F (37.4 C)  TempSrc: Oral Oral Oral Oral  Resp: 18 16 16 16   Height:      Weight:      SpO2: 97% 93% 92% 94%    Intake/Output Summary (Last 24 hours) at 05/02/14 1246 Last data filed at 05/02/14 0851  Gross per 24 hour  Intake   1910 ml  Output      0 ml  Net   1910 ml   Filed Weights   04/26/14 2330  Weight: 77.7 kg (171 lb 4.8 oz)    Exam:  General: no apparent distress, somewhat drowsy but wakes up easily   Cardiovascular: RRR  Respiratory: good air movement, no wheezing  Abdomen: soft, non tender  MSK: no edema  Neuro: non focal  Data Reviewed: Basic Metabolic Panel:  Recent Labs Lab 04/28/14 0455 04/29/14 0545 04/30/14 0420 05/01/14 0948 05/02/14 0548  NA 152* 151* 152* 151* 146  K 4.7 4.4 4.4 4.2 4.6  CL 113* 110 114* 108 105  CO2 27 30 28 31  33*  GLUCOSE 110* 118* 124* 192* 133*  BUN 15 17  17 18 20   CREATININE 0.98 1.04 0.99 1.09 1.15*  CALCIUM 10.9* 10.6* 10.1 10.6* 10.2   Liver Function Tests:  Recent Labs Lab 04/26/14 2037  AST 34  ALT 31  ALKPHOS 137*  BILITOT <0.2*  PROT 9.2*  ALBUMIN 3.8   CBC:  Recent Labs Lab 04/26/14 2037 04/27/14 0504 04/28/14 0455 04/30/14 0420  WBC 17.3* 16.0* 17.5* 13.5*  NEUTROABS 14.6*  --   --   --   HGB 13.1 13.2 12.5 11.9*  HCT 40.9 42.8 41.9 39.9  MCV 98.6 99.8 102.2* 102.3*  PLT 329 273 274 275   Scheduled Meds: . amLODipine  5 mg Oral Daily  . benztropine  1 mg Oral Daily  .  carbamazepine  200 mg Oral BID  . free water  200 mL Oral 3 times per day  . heparin  5,000 Units Subcutaneous 3 times per day  . levofloxacin  750 mg Oral Daily  . levothyroxine  125 mcg Oral QAC breakfast  . lithium carbonate  300 mg Oral q morning - 10a  . lithium carbonate  600 mg Oral QPM  . lurasidone  60 mg Oral Daily  . nystatin  5 mL Oral QID  . olopatadine  1 drop Left Eye BID  . senna  1 tablet Oral BID  . simvastatin  20 mg Oral q1800  . sodium chloride  3 mL Intravenous Q12H   Continuous Infusions: . dextrose 75 mL/hr at 04/30/14 1350    Time spent: 15 minutes  Kathlen ModyVijaya Kathleena Freeman, MD [facility] Pager [phone] . If 7 PM - 7 AM, please contact night-coverage at [email or web address], password [email or web address] 05/02/2014, 12:46 PM  LOS: 6 days

## 2014-05-02 NOTE — Progress Notes (Signed)
ANTIBIOTIC CONSULT NOTE - INITIAL  Pharmacy Consult for Zosyn Indication: Aspiration PNA  Allergies  Allergen Reactions  . Antihistamine Decongestant [Triprolidine-Pse] Diarrhea  . Codeine Other (See Comments)    Gi upset     Patient Measurements: Height: 5\' 5"  (165.1 cm) Weight: 171 lb 4.8 oz (77.7 kg) IBW/kg (Calculated) : 57 Adjusted Body Weight:   Vital Signs: Temp: 98.9 F (37.2 C) (11/29 1511) Temp Source: Oral (11/29 1511) BP: 127/75 mmHg (11/29 1511) Pulse Rate: 82 (11/29 1511) Intake/Output from previous day: 11/28 0701 - 11/29 0700 In: 1870 [P.O.:790; NG/GT:1080] Out: -  Intake/Output from this shift: Total I/O In: 240 [P.O.:240] Out: -   Labs:  Recent Labs  04/30/14 0420 05/01/14 0948 05/02/14 0548  WBC 13.5*  --   --   HGB 11.9*  --   --   PLT 275  --   --   CREATININE 0.99 1.09 1.15*   Estimated Creatinine Clearance: 44.9 mL/min (by C-G formula based on Cr of 1.15). No results for input(s): VANCOTROUGH, VANCOPEAK, VANCORANDOM, GENTTROUGH, GENTPEAK, GENTRANDOM, TOBRATROUGH, TOBRAPEAK, TOBRARND, AMIKACINPEAK, AMIKACINTROU, AMIKACIN in the last 72 hours.   Microbiology: Recent Results (from the past 720 hour(s))  Urine culture     Status: None   Collection Time: 04/26/14  9:06 PM  Result Value Ref Range Status   Specimen Description URINE, CLEAN CATCH  Final   Special Requests NONE  Final   Culture  Setup Time   Final    04/27/2014 06:19 Performed at [facility]    Colony Count NO GROWTH Performed at [facility]   Final   Culture NO GROWTH Performed at [facility]   Final   Report Status 04/28/2014 FINAL  Final  Culture, sputum-assessment     Status: None   Collection Time: 04/28/14  9:00 AM  Result Value Ref Range Status   Specimen Description SPUTUM  Final   Special Requests NONE  Final   Sputum evaluation   Final    THIS SPECIMEN IS ACCEPTABLE. RESPIRATORY CULTURE REPORT TO FOLLOW.   Report Status  04/28/2014 FINAL  Final  Culture, respiratory (NON-Expectorated)     Status: None   Collection Time: 04/28/14  9:00 AM  Result Value Ref Range Status   Specimen Description SPUTUM  Final   Special Requests NONE  Final   Gram Stain   Final    FEW WBC PRESENT,BOTH PMN AND MONONUCLEAR RARE SQUAMOUS EPITHELIAL CELLS PRESENT RARE GRAM POSITIVE COCCI IN PAIRS RARE GRAM POSITIVE RODS Performed at [facility]    Culture   Final    NORMAL OROPHARYNGEAL FLORA Performed at [facility]    Report Status 04/30/2014 FINAL  Final    Medical History: Past Medical History  Diagnosis Date  . Hypertension   . Bipolar 1 disorder   . Hypothyroidism   . Memory difficulties   . HLD (hyperlipidemia)   . Diverticulitis large intestine     Medications:  Scheduled:  . amLODipine  5 mg Oral Daily  . benztropine  1 mg Oral Daily  . carbamazepine  200 mg Oral BID  . free water  200 mL Oral 3 times per day  . heparin  5,000 Units Subcutaneous 3 times per day  . levothyroxine  125 mcg Oral QAC breakfast  . lithium carbonate  300 mg Oral q morning - 10a  . lithium carbonate  600 mg Oral QPM  . lurasidone  60 mg Oral Daily  . nystatin  5 mL Oral QID  . olopatadine  1 drop Left Eye BID  . senna  1 tablet Oral BID  . simvastatin  20 mg Oral q1800  . sodium chloride  3 mL Intravenous Q12H   Infusions:  . dextrose 75 mL/hr at 04/30/14 1350   PRN: guaiFENesin-dextromethorphan, magic mouthwash w/lidocaine, ondansetron **OR** ondansetron (ZOFRAN) IV, polyethylene glycol, RESOURCE THICKENUP CLEAR Assessment: 73 yo F with SOB/cough--diagnosed with aspiration pneumonia. CXR with Right LL infiltrate. Goal of Therapy:  Eradication of infection  Plan:  Zosyn 3.375 Gm IV q 8 hour --(infuse over 4 hours) Follow up culture results  Loletta Specterenz, Adela Esteban Jamieson 05/02/2014,8:50 PM

## 2014-05-02 NOTE — Plan of Care (Signed)
Problem: Phase II Progression Outcomes Goal: Discharge plan established Outcome: Completed/Met Date Met:  05/02/14  Problem: Discharge Progression Outcomes Goal: Tolerating diet Outcome: Completed/Met Date Met:  05/02/14

## 2014-05-03 ENCOUNTER — Inpatient Hospital Stay (HOSPITAL_COMMUNITY): Payer: PRIVATE HEALTH INSURANCE

## 2014-05-03 LAB — CULTURE, BLOOD (ROUTINE X 2)
CULTURE: NO GROWTH
Culture: NO GROWTH

## 2014-05-03 LAB — BASIC METABOLIC PANEL
Anion gap: 9 (ref 5–15)
BUN: 18 mg/dL (ref 6–23)
CO2: 31 mEq/L (ref 19–32)
Calcium: 10.2 mg/dL (ref 8.4–10.5)
Chloride: 107 mEq/L (ref 96–112)
Creatinine, Ser: 1.17 mg/dL — ABNORMAL HIGH (ref 0.50–1.10)
GFR calc Af Amer: 52 mL/min — ABNORMAL LOW (ref >90)
GFR calc non Af Amer: 45 mL/min — ABNORMAL LOW (ref >90)
GLUCOSE: 136 mg/dL — AB (ref 70–99)
Potassium: 4.2 mEq/L (ref 3.7–5.3)
Sodium: 147 mEq/L (ref 137–147)

## 2014-05-03 MED ORDER — FREE WATER: 200.0000 mL | Freq: Three times a day (TID) | Status: DC

## 2014-05-03 MED ORDER — RESOURCE THICKENUP CLEAR PO POWD: ORAL | Status: DC

## 2014-05-03 MED ORDER — AMOXICILLIN-POT CLAVULANATE 875-125 MG PO TABS: 1.0000 | ORAL_TABLET | Freq: Two times a day (BID) | ORAL | Status: DC

## 2014-05-03 MED ORDER — SENNA 8.6 MG PO TABS: 1.0000 | ORAL_TABLET | Freq: Two times a day (BID) | ORAL | Status: AC

## 2014-05-03 NOTE — Progress Notes (Addendum)
Speech Language Pathology Treatment: Dysphagia  Patient Details Name: [name] [name] MRN: [id number] DOB: 1941/01/11 Today's Date: 05/03/2014 Time: 0920-0932 SLP Time Calculation (min) (ACUTE ONLY): 12 min  Assessment / Plan / Recommendation Clinical Impression   Pt looks at the calendar and states "Its my birthday!"  SLP observed pt with both thin and nectar thick liquids, both tolerated well without evidence of aspiration except for some expectoration of mucous following intake at end of session.  Independently pt takes large sips with rapid intake. Given this behavior and findings of probable intermittent aspiration events impacting pulmonary function, recommend pt continue current diet (dys 3/nectar) with thin water allowed in between meals with supervision for small sips.  Pt reports she does not mind the thickened liquids and verbalizes understanding of need for nectar.    HPI HPI: 73 [facility] - with 7 days of cough, weakness and congestion-productive to green sputum.  She also experienced vomiting prior to admission.   PMH + for Bipolar disorder, essential HTN, acute kidney injury.  CXR 11/27 shlowed Mild bibasilar opacification right worse than left as well as possible increased density upper lobes that are worse than prior, possibly infection.  RN documented pt is impulsive with liquids and frequently coughs on water.   Swallow eval ordered.    Pertinent Vitals Pain Assessment: No/denies pain  SLP Plan  Continue with current plan of care    Recommendations Diet recommendations: Dysphagia 3 (mechanical soft);Nectar-thick liquid Liquids provided via: Cup Medication Administration: Whole meds with puree Supervision: Patient able to self feed;Intermittent supervision to cue for compensatory strategies Compensations: Slow rate;Small sips/bites              Oral Care Recommendations: Oral care BID Plan: Continue with current plan of care    GO    [facility] Center[facility]nie  Joi Leyva, MA CCC-SLP [phone]  Claudine MoutonDeBlois, Traycen Goyer Caroline 05/03/2014, 9:36 AM

## 2014-05-03 NOTE — Discharge Summary (Signed)
Physician Discharge Summary  [name] [name] ZOX:[id number] DOB: 1941/03/20 DOA: 04/26/2014  PCP: Astrid DivineGRIFFIN,ELAINE COLLINS, MD  Admit date: 04/26/2014 Discharge date: 05/03/2014  Time spent: 30 minutes  Recommendations for Outpatient Follow-up:  1. Follow Up with PCP  In one week.  2. Please get referral to neurology for dementia evaluation  3. Please follow up with psychiatry in 2 weeks .  4. Please check renal parameters in 1 to 2 days to evaluate renal function.   Discharge Diagnoses:  Principal Problem:   Community acquired pneumonia Active Problems:   Bipolar 1 disorder   Essential hypertension   Conjunctivitis   HLD (hyperlipidemia)   Hypothyroid   Acute kidney injury   Discharge Condition: improved.  Diet recommendation: (dys 3/nectar) with thin water allowed in between meals with supervision for small sips.  Filed Weights   04/26/14 2330  Weight: 77.7 kg (171 lb 4.8 oz)    History of present illness:  73 year old lady from home admitted for sob and cough. She was found to have pneumonia. She was started on IV antibiotics and later on transitioned to po antibiotics. Speech and swallow eval recommended dysphagia 3 diet.   Hospital Course:   Community Acquired Pneumonia:  - Based on clinical grounds as well as a chest x-ray with right lower lobe infiltrate. Repeat CXR slightly worse than on admission. Suspect she is continuously aspirating during meals, recommend another 3 days of antibiotics for aspiration pneumonia.  - Strep pneumo negative, legionella antigen negative.  - Blood cultures negative so far - Sputum cultures negative so far -flu PCR negative.   AKI: Cr 1.26 on admission. Baseline 0.7. Likely secondary to dehydration.  - Creatinine improved with fluids.  - Continue to monitor  HTN: intermittently taking over past few days due to illness.  - continue Norvasc  Bipolar: at baseline. Pt w/ known visual and auditory hallucinations at baseline. -  Lithium levels elevated, held lithium and repeat levels therapeutic. Resume lithium . - Carbamazepine level normal - Continue with other medications  Conjunctivitis: L eye. Likely viral as non-painful and minimal discharge  HLD: - continue home statin  Hypothyroid: - continue home Synthroid  Hypernatremia:  Encourage free water intake orally and started her on dextrose fluids. Repeat in am showed mild improvement.   Procedures:  none  Consultations: Physical thrapy Discharge Exam: Filed Vitals:   05/03/14 0437  BP: 137/86  Pulse: 77  Temp: 98.8 F (37.1 C)  Resp: 16    General: alert afebrile comfortable Cardiovascular: s1s2 Respiratory: ctab  Discharge Instructions You were cared for by a hospitalist during your hospital stay. If you have any questions about your discharge medications or the care you received while you were in the hospital after you are discharged, you can call the unit and asked to speak with the hospitalist on call if the hospitalist that took care of you is not available. Once you are discharged, your primary care physician will handle any further medical issues. Please note that NO REFILLS for any discharge medications will be authorized once you are discharged, as it is imperative that you return to your primary care physician (or establish a relationship with a primary care physician if you do not have one) for your aftercare needs so that they can reassess your need for medications and monitor your lab values.  Discharge Instructions    Diet - low sodium heart healthy    Complete by:  As directed      Discharge instructions  Complete by:  As directed   Follow up with PCP in one week.  Please check renal function in 1 to 2 days.          Current Discharge Medication List    START taking these medications   Details  amoxicillin-clavulanate (AUGMENTIN) 875-125 MG per tablet Take 1 tablet by mouth 2 (two) times daily. Qty: 6 tablet,  Refills: 0    Maltodextrin-Xanthan Gum (RESOURCE THICKENUP CLEAR) POWD Use as indicated.    senna (SENOKOT) 8.6 MG TABS tablet Take 1 tablet (8.6 mg total) by mouth 2 (two) times daily. Qty: 120 each, Refills: 0    Water For Irrigation, Sterile (FREE WATER) SOLN Take 200 mLs by mouth every 8 (eight) hours.      CONTINUE these medications which have NOT CHANGED   Details  amLODipine (NORVASC) 5 MG tablet Take 5 mg by mouth daily.    benztropine (COGENTIN) 1 MG tablet Take 1 mg by mouth daily.    carbamazepine (TEGRETOL) 200 MG tablet Take 200 mg by mouth 2 (two) times daily.    guaiFENesin-dextromethorphan (ROBITUSSIN DM) 100-10 MG/5ML syrup Take 5 mLs by mouth every 4 (four) hours as needed for cough.    levothyroxine (SYNTHROID, LEVOTHROID) 125 MCG tablet Take 125 mcg by mouth daily before breakfast.    lithium 300 MG tablet Take 300-600 mg by mouth 2 (two) times daily. 300 mg in the am and 600 mg in the pm    Lurasidone HCl (LATUDA) 60 MG TABS Take 60 mg by mouth daily.    Pheniramine-PE-APAP (THERAFLU FLU & SORE THROAT) 20-10-650 MG PACK Take 1 Package by mouth every 6 (six) hours as needed (cough).    simvastatin (ZOCOR) 20 MG tablet Take 20 mg by mouth daily at 6 PM.       Allergies  Allergen Reactions  . Antihistamine Decongestant [Triprolidine-Pse] Diarrhea  . Codeine Other (See Comments)    Gi upset       The results of significant diagnostics from this hospitalization (including imaging, microbiology, ancillary and laboratory) are listed below for reference.    Significant Diagnostic Studies: Dg Chest Port 1 View  05/03/2014   CLINICAL DATA:  Pneumonia.  EXAM: PORTABLE CHEST - 1 VIEW  COMPARISON:  April 30, 2014.  FINDINGS: Stable cardiomediastinal silhouette. No pneumothorax is noted. No significant pleural effusion is noted. Stable mild bibasilar opacities are noted most consistent with subsegmental atelectasis. Bony thorax is intact.  IMPRESSION: Stable  mild bibasilar subsegmental atelectasis.   Electronically Signed   By: Roque LiasJames  Green M.D.   On: 05/03/2014 10:52   Dg Chest Port 1 View  04/30/2014   CLINICAL DATA:  Cough, congestion, weakness and urinary frequency for 2 days.  EXAM: PORTABLE CHEST - 1 VIEW  COMPARISON:  04/26/2014 and 05/15/2010  FINDINGS: Lungs are hypoinflated with mild bibasilar opacification right worse than left slightly worse. Possible new mild opacification over the upper lungs right worse than left. Findings may be due to infection with small right effusion. Cardiomediastinal silhouette and remainder of the exam is unchanged.  IMPRESSION: Mild bibasilar opacification right worse than left as well as possible minimal airspace density over the upper lobes as findings are worse and may be due to infection. Possible small amount right pleural fluid.   Electronically Signed   By: Elberta Fortisaniel  Boyle M.D.   On: 04/30/2014 10:33   Dg Chest Port 1 View  04/26/2014   CLINICAL DATA:  Weakness and cough  EXAM: PORTABLE CHEST -  1 VIEW  COMPARISON:  05/15/2010  FINDINGS: Cardiac shadow is stable. The thoracic aorta is mildly tortuous. Increased density is noted in the right lung base consistent with early infiltrate. No sizable effusion is seen.  IMPRESSION: Early right basilar infiltrate.   Electronically Signed   By: Alcide Clever M.D.   On: 04/26/2014 21:57    Microbiology: Recent Results (from the past 240 hour(s))  Urine culture     Status: None   Collection Time: 04/26/14  9:06 PM  Result Value Ref Range Status   Specimen Description URINE, CLEAN CATCH  Final   Special Requests NONE  Final   Culture  Setup Time   Final    04/27/2014 06:19 Performed at [facility]    Colony Count NO GROWTH Performed at [facility]   Final   Culture NO GROWTH Performed at [facility]   Final   Report Status 04/28/2014 FINAL  Final  Culture, sputum-assessment     Status: None   Collection Time: 04/28/14  9:00 AM   Result Value Ref Range Status   Specimen Description SPUTUM  Final   Special Requests NONE  Final   Sputum evaluation   Final    THIS SPECIMEN IS ACCEPTABLE. RESPIRATORY CULTURE REPORT TO FOLLOW.   Report Status 04/28/2014 FINAL  Final  Culture, respiratory (NON-Expectorated)     Status: None   Collection Time: 04/28/14  9:00 AM  Result Value Ref Range Status   Specimen Description SPUTUM  Final   Special Requests NONE  Final   Gram Stain   Final    FEW WBC PRESENT,BOTH PMN AND MONONUCLEAR RARE SQUAMOUS EPITHELIAL CELLS PRESENT RARE GRAM POSITIVE COCCI IN PAIRS RARE GRAM POSITIVE RODS Performed at [facility]    Culture   Final    NORMAL OROPHARYNGEAL FLORA Performed at [facility]    Report Status 04/30/2014 FINAL  Final     Labs: Basic Metabolic Panel:  Recent Labs Lab 04/29/14 0545 04/30/14 0420 05/01/14 0948 05/02/14 0548 05/03/14 0524  NA 151* 152* 151* 146 147  K 4.4 4.4 4.2 4.6 4.2  CL 110 114* 108 105 107  CO2 30 28 31  33* 31  GLUCOSE 118* 124* 192* 133* 136*  BUN 17 17 18 20 18   CREATININE 1.04 0.99 1.09 1.15* 1.17*  CALCIUM 10.6* 10.1 10.6* 10.2 10.2   Liver Function Tests:  Recent Labs Lab 04/26/14 2037  AST 34  ALT 31  ALKPHOS 137*  BILITOT <0.2*  PROT 9.2*  ALBUMIN 3.8   No results for input(s): LIPASE, AMYLASE in the last 168 hours. No results for input(s): AMMONIA in the last 168 hours. CBC:  Recent Labs Lab 04/26/14 2037 04/27/14 0504 04/28/14 0455 04/30/14 0420  WBC 17.3* 16.0* 17.5* 13.5*  NEUTROABS 14.6*  --   --   --   HGB 13.1 13.2 12.5 11.9*  HCT 40.9 42.8 41.9 39.9  MCV 98.6 99.8 102.2* 102.3*  PLT 329 273 274 275   Cardiac Enzymes: No results for input(s): CKTOTAL, CKMB, CKMBINDEX, TROPONINI in the last 168 hours. BNP: BNP (last 3 results) No results for input(s): PROBNP in the last 8760 hours. CBG: No results for input(s): GLUCAP in the last 168 hours.     SignedKathlen Mody  [facility] 05/03/2014, 11:27 AM

## 2014-05-03 NOTE — Progress Notes (Signed)
Clinical Social Work Department CLINICAL SOCIAL WORK PLACEMENT NOTE 05/03/2014  Patient:  [name] [name],[name] [name]  Account Number:  [id number] Admit date:  04/26/2014  Clinical Social Worker:  Orpah GreekKELLY FOLEY, LCSWA  Date/time:  04/28/2014 11:42 AM  Clinical Social Work is seeking post-discharge placement for this patient at the following level of care:   SKILLED NURSING   (*CSW will update this form in Epic as items are completed)   04/28/2014  Patient/family provided with [facility] Department of Clinical Social Work's list of facilities offering this level of care within the geographic area requested by the patient (or if unable, by the patient's family).  04/28/2014  Patient/family informed of their freedom to choose among providers that offer the needed level of care, that participate in Medicare, Medicaid or managed care program needed by the patient, have an available bed and are willing to accept the patient.  04/28/2014  Patient/family informed of [facility]' ownership interest in [facility], as well as of the fact that they are under no obligation to receive care at this facility.  PASARR submitted to EDS on 04/28/2014 PASARR number received on 04/28/2014  FL2 transmitted to all facilities in geographic area requested by pt/family on  04/28/2014 FL2 transmitted to all facilities within larger geographic area on   Patient informed that his/her managed care company has contracts with or will negotiate with  certain facilities, including the following:     Patient/family informed of bed offers received:  04/28/2014 [facility] Physician recommends and patient chooses bed at    [name]  05/03/2014 Patient to be transferred to facility by [facility] Patient and family notified of transfer on 05/03/2014 Name of family member notified:  patient's son, Molli HazardMatthew via phone  The following physician request were  entered in Epic:   Additional Comments:   Lincoln MaxinKelly Laramie Meissner, LCSW [facility] Clinical Social Worker cell #: [phone]

## 2014-05-03 NOTE — Progress Notes (Signed)
Physical Therapy Treatment Patient Details Name: [name] [name] MRN: [id number] DOB: 03/06/1941 Today's Date: 05/03/2014    History of Present Illness Pt is a 73 yo female admitted for 7 day h/o cough, weakness, congestion, fever and vomiting.  Pt with CAP.  Pt with h/o bipolar d/o, hallucinations at baseline, memory loss, HTN, diverticulitis.    PT Comments    [facility] and was able to tolerate 60 feet of gait training in the hallway this tx.  Pt is min-mod A with all mobility and requires verbal and tactile cueing for transfers and gait.  Pt completed gait training on RA; pt was asymptomatic.    Follow Up Recommendations  SNF     Equipment Recommendations       Recommendations for Other Services       Precautions / Restrictions Precautions Precautions: Fall Restrictions Weight Bearing Restrictions: No    Mobility  Bed Mobility Overal bed mobility: Needs Assistance Bed Mobility: Supine to Sit     Supine to sit: Mod assist;HOB elevated     General bed mobility comments: pt requires min A to get to sitting EOB, mod A for forward scoot using bed pad  Transfers Overall transfer level: Needs assistance Equipment used: Rolling walker (2 wheeled) Transfers: Sit to/from Stand Sit to Stand: Mod assist         General transfer comment: verbal and tactile cues for hand placement; increased time and effort to rise; performed 2x from EOB and BSC  Ambulation/Gait Ambulation/Gait assistance: Min assist Ambulation Distance (Feet): 60 Feet Assistive device: Rolling walker (2 wheeled) Gait Pattern/deviations: Step-to pattern;Shuffle Gait velocity: decreased   General Gait Details: verbal and tactile cues for RW management and forward progress; increased time; pt performs on RA   Stairs            Wheelchair Mobility    Modified Rankin (Stroke Patients Only)       Balance                                    Cognition  Arousal/Alertness: Awake/alert Behavior During Therapy: Flat affect Overall Cognitive Status: No family/caregiver present to determine baseline cognitive functioning                      Exercises      General Comments        Pertinent Vitals/Pain Pain Assessment: No/denies pain    Home Living                      Prior Function            PT Goals (current goals can now be found in the care plan section) Progress towards PT goals: Progressing toward goals    Frequency  Min 3X/week    PT Plan Current plan remains appropriate    Co-evaluation             End of Session Equipment Utilized During Treatment: Gait belt Activity Tolerance: Patient tolerated treatment well Patient left: in chair;with call bell/phone within reach;with chair alarm set     Time: [id number] PT Time Calculation (min) (ACUTE ONLY): 30 min  Charges:                       G Codes:      Miller,Derrick, SPTA 05/03/2014, 2:35 PM   Reviewed  above  Felecia ShellingLori Hessie Varone  PTA [facility]  Acute  Rehab Pager      [phone]

## 2014-05-03 NOTE — Progress Notes (Signed)
Occupational Therapy Treatment Patient Details Name: [name] [name] MRN: [id number] DOB: Sep 14, 1940 Today's Date: 05/03/2014    History of present illness Pt is a 73 yo female admitted for 7 day h/o cough, weakness, congestion, fever and vomiting.  Pt with CAP.  Pt with h/o bipolar d/o, hallucinations at baseline, memory loss, HTN, diverticulitis.   OT comments  Awake and alert, agreeable to participation in skilled OT.  Completed light grooming tasks with set up.  Requires mod/max a for functional transfers with max instructional and tactile guidance for sequencing and safety.  Agree with current recommendations for d/c plans.   Follow Up Recommendations  SNF;Supervision/Assistance - 24 hour                 Precautions / Restrictions Precautions Precautions: Fall Precaution Comments: droplet Restrictions Weight Bearing Restrictions: No       Mobility Bed Mobility Overal bed mobility: Needs Assistance Bed Mobility: Rolling;Sidelying to Sit Rolling: Min guard Sidelying to sit: Min guard;HOB elevated       General bed mobility comments: hob elevated, pt. initiated reaching for bed rails to pull self into sidelying, able to maintain un supported sitting eob  Transfers Overall transfer level: Needs assistance Equipment used: Rolling walker (2 wheeled) Transfers: Sit to/from [employer]Stand;Stand Pivot Transfers Sit to Stand: Mod assist;Max assist Stand pivot transfers: Mod assist;Max assist       General transfer comment: VCs for hand placement and sequencing; increased time for stabilization plus max assist for turn completion and hand over hand placement, due to increased time for any step initiation or progression during pivot.      Balance Overall balance assessment: Needs assistance   Sitting balance-Leahy Scale: Fair       Standing balance-Leahy Scale: Poor                     ADL Overall ADL's : Needs assistance/impaired     Grooming: Wash/dry  face;Wash/dry hands;Oral care;Set up;Bed level;Sitting                   Toilet Transfer: Moderate assistance;Maximal assistance;Cueing for sequencing;Stand-pivot;BSC;RW Toilet Transfer Details (indicate cue type and reason): simulated transfer from eob to recliner Toileting- Clothing Manipulation and Hygiene: Maximal assistance;Sit to/from stand Toileting - Clothing Manipulation Details (indicate cue type and reason): simulated during stand pivot transfer from eob to recliner     Functional mobility during ADLs: Moderate assistance;Maximal assistance;Cueing for sequencing;Rolling walker General ADL Comments: pt. able to maintain un supported sitting eob, and complete light grooming tasks and oral care, mod/max for simulated toileting due to sig. delay with step progression during transfer                                      Cognition   Behavior During Therapy: Flat affect Overall Cognitive Status: No family/caregiver present to determine baseline cognitive functioning Area of Impairment: Memory;Following commands;Problem solving;Safety/judgement   Current Attention Level: Focused    Following Commands: Follows one step commands inconsistently Safety/Judgement: Decreased awareness of deficits;Decreased awareness of safety Awareness: Intellectual Problem Solving: Slow processing;Decreased initiation;Requires verbal cues                                       Pertinent Vitals/ Pain       Pain Assessment: No/denies pain  Frequency Min 2X/week     Progress Toward Goals  OT Goals(current goals can now be found in the care plan section)  Progress towards OT goals: Progressing toward goals     Plan Discharge plan remains appropriate                     End of Session Equipment Utilized During Treatment: Gait belt;Rolling walker;Oxygen   Activity  Tolerance Patient tolerated treatment well   Patient Left in chair;with call bell/phone within reach;Other (comment) (ST in room at end of session)   Nurse Communication Mobility status        Time: [id number] OT Time Calculation (min): 17 min  Charges: OT General Charges $OT Visit: 1 Procedure OT Treatments $Self Care/Home Management : 8-22 mins  Robet LeuMorris, Letishia Elliott Lorraine, COTA/L 05/03/2014, 9:35 AM

## 2014-06-16 ENCOUNTER — Emergency Department (HOSPITAL_COMMUNITY): Payer: Medicare Other

## 2014-06-16 ENCOUNTER — Inpatient Hospital Stay (HOSPITAL_COMMUNITY)
Admission: EM | Admit: 2014-06-16 | Discharge: 2014-06-25 | DRG: 871 | Disposition: A | Discharge status: Skilled Nursing Facility | Payer: Medicare Other | Attending: Internal Medicine | Admitting: Internal Medicine

## 2014-06-16 ENCOUNTER — Inpatient Hospital Stay (HOSPITAL_COMMUNITY): Payer: Medicare Other

## 2014-06-16 ENCOUNTER — Encounter (HOSPITAL_COMMUNITY): Payer: Self-pay | Admitting: *Deleted

## 2014-06-16 DIAGNOSIS — I248 Other forms of acute ischemic heart disease: Secondary | ICD-10-CM | POA: Diagnosis not present

## 2014-06-16 DIAGNOSIS — I739 Peripheral vascular disease, unspecified: Secondary | ICD-10-CM | POA: Diagnosis present

## 2014-06-16 DIAGNOSIS — D7589 Other specified diseases of blood and blood-forming organs: Secondary | ICD-10-CM | POA: Diagnosis present

## 2014-06-16 DIAGNOSIS — R40243 Glasgow coma scale score 3-8: Secondary | ICD-10-CM | POA: Diagnosis not present

## 2014-06-16 DIAGNOSIS — J9601 Acute respiratory failure with hypoxia: Secondary | ICD-10-CM | POA: Diagnosis present

## 2014-06-16 DIAGNOSIS — N189 Chronic kidney disease, unspecified: Secondary | ICD-10-CM | POA: Diagnosis present

## 2014-06-16 DIAGNOSIS — I4510 Unspecified right bundle-branch block: Secondary | ICD-10-CM | POA: Diagnosis present

## 2014-06-16 DIAGNOSIS — R740 Nonspecific elevation of levels of transaminase and lactic acid dehydrogenase [LDH]: Secondary | ICD-10-CM | POA: Diagnosis present

## 2014-06-16 DIAGNOSIS — E785 Hyperlipidemia, unspecified: Secondary | ICD-10-CM | POA: Diagnosis present

## 2014-06-16 DIAGNOSIS — J9600 Acute respiratory failure, unspecified whether with hypoxia or hypercapnia: Secondary | ICD-10-CM

## 2014-06-16 DIAGNOSIS — E870 Hyperosmolality and hypernatremia: Secondary | ICD-10-CM | POA: Diagnosis present

## 2014-06-16 DIAGNOSIS — R7989 Other specified abnormal findings of blood chemistry: Secondary | ICD-10-CM

## 2014-06-16 DIAGNOSIS — I469 Cardiac arrest, cause unspecified: Secondary | ICD-10-CM | POA: Diagnosis not present

## 2014-06-16 DIAGNOSIS — I10 Essential (primary) hypertension: Secondary | ICD-10-CM | POA: Diagnosis present

## 2014-06-16 DIAGNOSIS — R509 Fever, unspecified: Secondary | ICD-10-CM | POA: Diagnosis not present

## 2014-06-16 DIAGNOSIS — D631 Anemia in chronic kidney disease: Secondary | ICD-10-CM | POA: Diagnosis present

## 2014-06-16 DIAGNOSIS — E860 Dehydration: Secondary | ICD-10-CM | POA: Diagnosis present

## 2014-06-16 DIAGNOSIS — N179 Acute kidney failure, unspecified: Secondary | ICD-10-CM | POA: Diagnosis present

## 2014-06-16 DIAGNOSIS — R7401 Elevation of levels of liver transaminase levels: Secondary | ICD-10-CM | POA: Diagnosis present

## 2014-06-16 DIAGNOSIS — R4182 Altered mental status, unspecified: Secondary | ICD-10-CM | POA: Insufficient documentation

## 2014-06-16 DIAGNOSIS — Z79899 Other long term (current) drug therapy: Secondary | ICD-10-CM

## 2014-06-16 DIAGNOSIS — G9341 Metabolic encephalopathy: Secondary | ICD-10-CM | POA: Diagnosis present

## 2014-06-16 DIAGNOSIS — G9340 Encephalopathy, unspecified: Secondary | ICD-10-CM | POA: Diagnosis not present

## 2014-06-16 DIAGNOSIS — Z978 Presence of other specified devices: Secondary | ICD-10-CM

## 2014-06-16 DIAGNOSIS — R6510 Systemic inflammatory response syndrome (SIRS) of non-infectious origin without acute organ dysfunction: Secondary | ICD-10-CM | POA: Diagnosis present

## 2014-06-16 DIAGNOSIS — F319 Bipolar disorder, unspecified: Secondary | ICD-10-CM | POA: Diagnosis present

## 2014-06-16 DIAGNOSIS — E039 Hypothyroidism, unspecified: Secondary | ICD-10-CM | POA: Diagnosis present

## 2014-06-16 DIAGNOSIS — Z4659 Encounter for fitting and adjustment of other gastrointestinal appliance and device: Secondary | ICD-10-CM

## 2014-06-16 DIAGNOSIS — I70209 Unspecified atherosclerosis of native arteries of extremities, unspecified extremity: Secondary | ICD-10-CM | POA: Diagnosis present

## 2014-06-16 DIAGNOSIS — R739 Hyperglycemia, unspecified: Secondary | ICD-10-CM | POA: Diagnosis present

## 2014-06-16 DIAGNOSIS — A419 Sepsis, unspecified organism: Secondary | ICD-10-CM | POA: Diagnosis present

## 2014-06-16 DIAGNOSIS — J690 Pneumonitis due to inhalation of food and vomit: Secondary | ICD-10-CM | POA: Diagnosis not present

## 2014-06-16 DIAGNOSIS — Z7982 Long term (current) use of aspirin: Secondary | ICD-10-CM | POA: Diagnosis not present

## 2014-06-16 DIAGNOSIS — I129 Hypertensive chronic kidney disease with stage 1 through stage 4 chronic kidney disease, or unspecified chronic kidney disease: Secondary | ICD-10-CM | POA: Diagnosis present

## 2014-06-16 DIAGNOSIS — Z452 Encounter for adjustment and management of vascular access device: Secondary | ICD-10-CM

## 2014-06-16 DIAGNOSIS — D696 Thrombocytopenia, unspecified: Secondary | ICD-10-CM | POA: Diagnosis present

## 2014-06-16 DIAGNOSIS — E876 Hypokalemia: Secondary | ICD-10-CM | POA: Diagnosis present

## 2014-06-16 DIAGNOSIS — R778 Other specified abnormalities of plasma proteins: Secondary | ICD-10-CM | POA: Diagnosis present

## 2014-06-16 HISTORY — DX: Pneumonia, unspecified organism: J18.9

## 2014-06-16 LAB — URINALYSIS, ROUTINE W REFLEX MICROSCOPIC
Bilirubin Urine: NEGATIVE
Glucose, UA: NEGATIVE mg/dL
HGB URINE DIPSTICK: NEGATIVE
Ketones, ur: NEGATIVE mg/dL
Leukocytes, UA: NEGATIVE
Nitrite: NEGATIVE
Protein, ur: NEGATIVE mg/dL
SPECIFIC GRAVITY, URINE: 1.012 (ref 1.005–1.030)
Urobilinogen, UA: 0.2 mg/dL (ref 0.0–1.0)
pH: 6.5 (ref 5.0–8.0)

## 2014-06-16 LAB — I-STAT CHEM 8, ED
BUN: 31 mg/dL — AB (ref 6–23)
CALCIUM ION: 1.21 mmol/L (ref 1.13–1.30)
CHLORIDE: 122 meq/L — AB (ref 96–112)
Creatinine, Ser: 1.8 mg/dL — ABNORMAL HIGH (ref 0.50–1.10)
Glucose, Bld: 115 mg/dL — ABNORMAL HIGH (ref 70–99)
HCT: 44 % (ref 36.0–46.0)
Hemoglobin: 15 g/dL (ref 12.0–15.0)
POTASSIUM: 4.1 mmol/L (ref 3.5–5.1)
SODIUM: 155 mmol/L — AB (ref 135–145)
TCO2: 22 mmol/L (ref 0–100)

## 2014-06-16 LAB — CBC WITH DIFFERENTIAL/PLATELET
Basophils Absolute: 0 10*3/uL (ref 0.0–0.1)
Basophils Relative: 0 % (ref 0–1)
EOS ABS: 0 10*3/uL (ref 0.0–0.7)
Eosinophils Relative: 0 % (ref 0–5)
HCT: 47 % — ABNORMAL HIGH (ref 36.0–46.0)
HEMOGLOBIN: 14 g/dL (ref 12.0–15.0)
Lymphocytes Relative: 6 % — ABNORMAL LOW (ref 12–46)
Lymphs Abs: 0.8 10*3/uL (ref 0.7–4.0)
MCH: 30.4 pg (ref 26.0–34.0)
MCHC: 29.8 g/dL — AB (ref 30.0–36.0)
MCV: 102 fL — ABNORMAL HIGH (ref 78.0–100.0)
MONO ABS: 1.1 10*3/uL — AB (ref 0.1–1.0)
Monocytes Relative: 8 % (ref 3–12)
NEUTROS PCT: 86 % — AB (ref 43–77)
Neutro Abs: 11.9 10*3/uL — ABNORMAL HIGH (ref 1.7–7.7)
Platelets: 297 10*3/uL (ref 150–400)
RBC: 4.61 MIL/uL (ref 3.87–5.11)
RDW: 13.3 % (ref 11.5–15.5)
WBC: 13.7 10*3/uL — ABNORMAL HIGH (ref 4.0–10.5)

## 2014-06-16 LAB — GRAM STAIN: GRAM STAIN: NONE SEEN

## 2014-06-16 LAB — COMPREHENSIVE METABOLIC PANEL
ALT: 45 U/L — ABNORMAL HIGH (ref 0–35)
ANION GAP: 6 (ref 5–15)
AST: 64 U/L — ABNORMAL HIGH (ref 0–37)
Albumin: 3.7 g/dL (ref 3.5–5.2)
Alkaline Phosphatase: 96 U/L (ref 39–117)
BUN: 25 mg/dL — AB (ref 6–23)
CALCIUM: 9.6 mg/dL (ref 8.4–10.5)
CO2: 24 mmol/L (ref 19–32)
CREATININE: 2.02 mg/dL — AB (ref 0.50–1.10)
Chloride: 121 mEq/L — ABNORMAL HIGH (ref 96–112)
GFR calc non Af Amer: 23 mL/min — ABNORMAL LOW (ref >90)
GFR, EST AFRICAN AMERICAN: 27 mL/min — AB (ref >90)
Glucose, Bld: 112 mg/dL — ABNORMAL HIGH (ref 70–99)
Potassium: 3.9 mmol/L (ref 3.5–5.1)
Sodium: 151 mmol/L — ABNORMAL HIGH (ref 135–145)
TOTAL PROTEIN: 6.8 g/dL (ref 6.0–8.3)
Total Bilirubin: 0.7 mg/dL (ref 0.3–1.2)

## 2014-06-16 LAB — TROPONIN I
TROPONIN I: 0.08 ng/mL — AB (ref <0.031)
Troponin I: 0.09 ng/mL — ABNORMAL HIGH (ref <0.031)

## 2014-06-16 LAB — CSF CELL COUNT WITH DIFFERENTIAL
RBC Count, CSF: 39 /mm3 — ABNORMAL HIGH
Tube #: 4
WBC, CSF: 1 /mm3 (ref 0–5)

## 2014-06-16 LAB — I-STAT CG4 LACTIC ACID, ED: LACTIC ACID, VENOUS: 1.01 mmol/L (ref 0.5–2.2)

## 2014-06-16 LAB — MRSA PCR SCREENING: MRSA by PCR: POSITIVE — AB

## 2014-06-16 LAB — LITHIUM LEVEL: Lithium Lvl: 1.27 mmol/L (ref 0.80–1.40)

## 2014-06-16 LAB — CARBAMAZEPINE LEVEL, TOTAL: Carbamazepine Lvl: <2 ug/mL — ABNORMAL LOW (ref 4.0–12.0)

## 2014-06-16 MED ORDER — ONDANSETRON HCL 4 MG/2ML IJ SOLN: 4.0000 mg | Freq: Four times a day (QID) | INTRAMUSCULAR | Status: DC | PRN

## 2014-06-16 MED ORDER — ACETAMINOPHEN 325 MG PO TABS: 650.0000 mg | ORAL_TABLET | Freq: Four times a day (QID) | ORAL | Status: DC | PRN

## 2014-06-16 MED ORDER — SODIUM CHLORIDE 0.9 % IJ SOLN
3.0000 mL | Freq: Two times a day (BID) | INTRAMUSCULAR | Status: DC
  Administered 2014-06-16 – 2014-06-24 (×10): 3 mL via INTRAVENOUS

## 2014-06-16 MED ORDER — SODIUM CHLORIDE 0.9 % IV BOLUS (SEPSIS)
1000.0000 mL | Freq: Once | INTRAVENOUS | Status: AC
  Administered 2014-06-16: 1000 mL via INTRAVENOUS

## 2014-06-16 MED ORDER — THIAMINE HCL 100 MG/ML IJ SOLN
100.0000 mg | Freq: Every day | INTRAMUSCULAR | Status: DC
  Administered 2014-06-17 – 2014-06-18 (×2): 100 mg via INTRAVENOUS
  Filled 2014-06-16: qty 1
  Filled 2014-06-16: qty 2

## 2014-06-16 MED ORDER — HEPARIN SODIUM (PORCINE) 5000 UNIT/ML IJ SOLN
5000.0000 [IU] | Freq: Three times a day (TID) | INTRAMUSCULAR | Status: DC
  Administered 2014-06-16 – 2014-06-19 (×8): 5000 [IU] via SUBCUTANEOUS
  Filled 2014-06-16 (×10): qty 1

## 2014-06-16 MED ORDER — ALBUTEROL SULFATE (2.5 MG/3ML) 0.083% IN NEBU: 2.5000 mg | INHALATION_SOLUTION | RESPIRATORY_TRACT | Status: DC | PRN

## 2014-06-16 MED ORDER — SODIUM CHLORIDE 0.9 % IV SOLN
Freq: Once | INTRAVENOUS | Status: AC
  Administered 2014-06-16: 12:00:00 via INTRAVENOUS

## 2014-06-16 MED ORDER — DEXTROSE 5 % IV SOLN
1.0000 g | Freq: Two times a day (BID) | INTRAVENOUS | Status: DC
  Administered 2014-06-16 – 2014-06-17 (×3): 1 g via INTRAVENOUS
  Filled 2014-06-16 (×5): qty 1

## 2014-06-16 MED ORDER — ONDANSETRON HCL 4 MG PO TABS: 4.0000 mg | ORAL_TABLET | Freq: Four times a day (QID) | ORAL | Status: DC | PRN

## 2014-06-16 MED ORDER — ACETAMINOPHEN 650 MG RE SUPP
650.0000 mg | Freq: Once | RECTAL | Status: AC
  Administered 2014-06-16: 650 mg via RECTAL
  Filled 2014-06-16: qty 1

## 2014-06-16 MED ORDER — ACETAMINOPHEN 650 MG RE SUPP
650.0000 mg | Freq: Four times a day (QID) | RECTAL | Status: DC | PRN
  Administered 2014-06-18: 650 mg via RECTAL
  Filled 2014-06-16: qty 1

## 2014-06-16 MED ORDER — VANCOMYCIN HCL IN DEXTROSE 1-5 GM/200ML-% IV SOLN
1000.0000 mg | INTRAVENOUS | Status: DC
  Administered 2014-06-17: 1000 mg via INTRAVENOUS
  Filled 2014-06-16: qty 200

## 2014-06-16 MED ORDER — CETYLPYRIDINIUM CHLORIDE 0.05 % MT LIQD
7.0000 mL | Freq: Two times a day (BID) | OROMUCOSAL | Status: DC
  Administered 2014-06-17 – 2014-06-21 (×10): 7 mL via OROMUCOSAL

## 2014-06-16 MED ORDER — SODIUM CHLORIDE 0.9 % IV SOLN
INTRAVENOUS | Status: DC
  Administered 2014-06-16: 18:00:00 via INTRAVENOUS

## 2014-06-16 MED ORDER — DEXTROSE 5 % IV SOLN
2.0000 g | INTRAVENOUS | Status: AC
  Administered 2014-06-16: 2 g via INTRAVENOUS
  Filled 2014-06-16: qty 2

## 2014-06-16 MED ORDER — CHLORHEXIDINE GLUCONATE 0.12 % MT SOLN
15.0000 mL | Freq: Two times a day (BID) | OROMUCOSAL | Status: DC
  Administered 2014-06-16 – 2014-06-22 (×12): 15 mL via OROMUCOSAL
  Filled 2014-06-16 (×14): qty 15

## 2014-06-16 MED ORDER — VANCOMYCIN HCL 10 G IV SOLR
1500.0000 mg | INTRAVENOUS | Status: AC
  Administered 2014-06-16: 1500 mg via INTRAVENOUS
  Filled 2014-06-16: qty 1500

## 2014-06-16 NOTE — ED Notes (Addendum)
Per ems pt from rehab facility for PNA. Staff reported pt was not acting herself yesterday, found unresponsive today. Fever started yesterday.   Upon ems arrival pt unresponsive, will now open eyes, but will not talk, no withdrawal from pain.

## 2014-06-16 NOTE — Progress Notes (Signed)
[facility] ED Cm spoke with attending MD to gather more information on severity of illness.  Pending further tests per Attending MD

## 2014-06-16 NOTE — ED Notes (Signed)
Pt is to go to interventional radiology for LP, then will be transported upstairs.  [facility] to [facility], Charity fundraiserN.

## 2014-06-16 NOTE — Consult Note (Signed)
Name: [name] Arhoebe Dorow MRN: [id number] DOB: 01-08-1941    ADMISSION DATE:  06/16/2014 CONSULTATION DATE:  06/16/14  REFERRING MD :  Dr. Rubin PayorPickering   CHIEF COMPLAINT:  AMS / Fever   BRIEF PATIENT DESCRIPTION: 74 y/o F, SNF resident admitted 1/13 with fever and altered mental status.  Initial concerns for potential intubation and PCCM consulted for evaluation.    SIGNIFICANT EVENTS  1/13  Admit with fever / AMS  STUDIES:  1/13  CT Head >>  Negative    HISTORY OF PRESENT ILLNESS:  74 y/o F, SNF resident with a past medical history of hypertension, hypothyroidism, hyperlipidemia, diverticulitis, bipolar disorder and memory loss who [facility] from a skilled nursing facility with approximately 24 hours of progressive lethargy and fever. Of note, she was hospitalized in November for pneumonia and acute renal failure.  She has resided at a skilled nursing facility since that time.  ER evaluation noted her to have a rectal temp of 102F, altered mental status with initial concerns for need of mechanical ventilation. Her son reported he last saw her 4 days prior to admission and was doing well at that time. She reportedly has had decreased oral intake/decreased appetite.  The patient was treated with IV fluids in the emergency room with improvement in mental status. Initial lab work notable for sodium 151, potassium 3.9, chloride 121, serum creatinine 2.02 (baseline ~ 1.1), glucose 121, lactic acid 1.01, hemoglobin 15 and platelets 297.  PCCM consulted to evaluate for AMS and need for intubation.  PAST MEDICAL HISTORY :   has a past medical history of Hypertension; Bipolar 1 disorder; Hypothyroidism; Memory difficulties; HLD (hyperlipidemia); Diverticulitis large intestine; and Pneumonia.  has past surgical history that includes No past surgeries.   HOME MEDICATIONS:  Prior to Admission medications   Medication Sig Start Date End Date Taking? Authorizing Provider    amLODipine (NORVASC) 5 MG tablet Take 5 mg by mouth daily with breakfast.    Yes Historical Provider, MD  antiseptic oral rinse (BIOTENE) LIQD 15 mLs by Mouth Rinse route as needed for dry mouth.   Yes Historical Provider, MD  b complex vitamins capsule Take 1 capsule by mouth daily with breakfast.   Yes Historical Provider, MD  benztropine (COGENTIN) 1 MG tablet Take 1 mg by mouth daily with breakfast.    Yes Historical Provider, MD  carbamazepine (TEGRETOL) 200 MG tablet Take 200 mg by mouth 2 (two) times daily.   Yes Historical Provider, MD  cholecalciferol (VITAMIN D) 1000 UNITS tablet Take 1,000 Units by mouth daily with breakfast.   Yes Historical Provider, MD  guaiFENesin-dextromethorphan (ROBITUSSIN DM) 100-10 MG/5ML syrup Take 5 mLs by mouth every 4 (four) hours as needed for cough.   Yes Historical Provider, MD  lactobacillus acidophilus (BACID) TABS tablet Take 1 tablet by mouth 2 (two) times daily.   Yes Historical Provider, MD  levothyroxine (SYNTHROID, LEVOTHROID) 150 MCG tablet Take 150 mcg by mouth daily before breakfast.   Yes Historical Provider, MD  lithium carbonate 300 MG capsule Take 300-600 mg by mouth 2 (two) times daily. Takes 300mg  in the morning and 600mg  in the evening   Yes Historical Provider, MD  LORazepam (ATIVAN) 0.5 MG tablet Take 0.5 mg by mouth every 6 (six) hours as needed for anxiety (and agitation).   Yes Historical Provider, MD  Maltodextrin-Xanthan Gum (RESOURCE THICKENUP CLEAR) POWD Use as indicated. 05/03/14  Yes Kathlen ModyVijaya Akula, MD  phenol (CHLORASEPTIC) 1.4 % LIQD  Use as directed 2 sprays in the mouth or throat every 2 (two) hours as needed for throat irritation / pain.   Yes Historical Provider, MD  senna (SENOKOT) 8.6 MG TABS tablet Take 1 tablet (8.6 mg total) by mouth 2 (two) times daily. 05/03/14  Yes Kathlen Mody, MD  simvastatin (ZOCOR) 20 MG tablet Take 20 mg by mouth at bedtime.    Yes Historical Provider, MD  temazepam (RESTORIL) 7.5 MG capsule  Take 7.5 mg by mouth at bedtime.   Yes Historical Provider, MD  traMADol (ULTRAM) 50 MG tablet Take 25 mg by mouth every 6 (six) hours as needed (for pain).   Yes Historical Provider, MD  amoxicillin-clavulanate (AUGMENTIN) 875-125 MG per tablet Take 1 tablet by mouth 2 (two) times daily. Patient not taking: Reported on 06/16/2014 05/03/14   Kathlen Mody, MD  Water For Irrigation, Sterile (FREE WATER) SOLN Take 200 mLs by mouth every 8 (eight) hours. Patient not taking: Reported on 06/16/2014 05/03/14   Kathlen Mody, MD   Allergies  Allergen Reactions  . Antihistamine Decongestant [Triprolidine-Pse] Diarrhea  . Codeine Other (See Comments)    Gi upset     FAMILY HISTORY:  family history includes Heart disease in her father.   SOCIAL HISTORY:  reports that she has never smoked. She has never used smokeless tobacco. She reports that she does not drink alcohol or use illicit drugs.  REVIEW OF SYSTEMS:  Unable to complete as patient is altered.   SUBJECTIVE:   VITAL SIGNS: Temp:  [100.3 F (37.9 C)-102.7 F (39.3 C)] 100.3 F (37.9 C) (01/13 1443) Pulse Rate:  [77-101] 84 (01/13 1445) Resp:  [22-28] 24 (01/13 1445) BP: (155-177)/(66-97) 177/95 mmHg (01/13 1445) SpO2:  [90 %-95 %] 93 % (01/13 1445) Weight:  [180 lb (81.647 kg)] 180 lb (81.647 kg) (01/13 1142)  PHYSICAL EXAMINATION: General:  Elderly female in NAD Neuro:  Lethargic, opens eyes to name, NAD  HEENT:  Dry mm, crusting noted Cardiovascular:  s1s2 rrr, no m/r/g Lungs:  resp's even/non-labored, lungs bilaterally diminished but clear  Abdomen:  NTND, bsx4 active  Musculoskeletal:  No acute deformities  Skin:  Warm/dry, no edema    Recent Labs Lab 06/16/14 1153 06/16/14 1156  NA 151* 155*  K 3.9 4.1  CL 121* 122*  CO2 24  --   BUN 25* 31*  CREATININE 2.02* 1.80*  GLUCOSE 112* 115*    Recent Labs Lab 06/16/14 1153 06/16/14 1156  HGB 14.0 15.0  HCT 47.0* 44.0  WBC 13.7*  --   PLT 297  --    Ct Head  Wo Contrast  06/16/2014   CLINICAL DATA:  Altered mental status. Unable to communicate. Unable to walk.  EXAM: CT HEAD WITHOUT CONTRAST  TECHNIQUE: Contiguous axial images were obtained from the base of the skull through the vertex without intravenous contrast.  COMPARISON:  10/07/2010  FINDINGS: No acute intracranial abnormality. Specifically, no hemorrhage, hydrocephalus, mass lesion, acute infarction, or significant intracranial injury. No acute calvarial abnormality. Visualized paranasal sinuses and mastoids clear. Orbital soft tissues unremarkable.  IMPRESSION: No acute intracranial abnormality.   Electronically Signed   By: Charlett Nose M.D.   On: 06/16/2014 14:17   Dg Chest Port 1 View  06/16/2014   CLINICAL DATA:  Altered mental status.  EXAM: PORTABLE CHEST - 1 VIEW  COMPARISON:  Single view of the chest 05/03/2014 and 04/30/2014. PA and lateral chest 05/15/2010.  FINDINGS: Mild subsegmental atelectasis or scar in the right lung base  appears unchanged. The left lung is clear. There is no pneumothorax or pleural effusion. Heart size is upper normal. There is no evidence of pulmonary edema. Scoliosis is noted.  IMPRESSION: No acute finding with mild subsegmental atelectasis or scar on the right base unchanged.   Electronically Signed   By: Drusilla Kanner M.D.   On: 06/16/2014 11:53    ASSESSMENT / PLAN:  Fever - temp up to 102 with AMS SIRS   Plan: PRN tylenol  Pan culture Assess Flu panel  Acute Encephalopathy - in setting of fever, hypernatremia and probable dehydration.  CT head negative.  Improving.  Baseline Dementia (?) - documented memory difficulties but uncertain to what degree Bipolar Disorder - on lithium and carbamazepine, may be contributing to change in MS & sodium disturbance  Plan: Gentle hydration LP per primary SVC Assess drug levels per primary MD Monitor mental status for changes / decline   At Risk Aspiration - in setting of above   Plan: Aspiration  precautions  NPO until mental status improves Intermittent CXR   Acute Kidney Injury - in setting of volume depletion and fever  Plan: Monitor volume status Ensure adequate renal perfusion  Trend BMP Replace electrolytes as indicated    PCCM will be available PRN.  Please call if new needs arise.    Canary Brim, NP-C [facility] Pgr: [phone] or [phone]    06/16/2014, 3:31 PM

## 2014-06-16 NOTE — ED Notes (Signed)
Bed: RESA Expected date:  Expected time:  Means of arrival:  Comments: EMS/altered L.O.C./febrile

## 2014-06-16 NOTE — Progress Notes (Signed)
ANTIBIOTIC CONSULT NOTE - INITIAL  Pharmacy Consult for cefepime, vancomycin Indication: possible HCAP  Allergies  Allergen Reactions  . Antihistamine Decongestant [Triprolidine-Pse] Diarrhea  . Codeine Other (See Comments)    Gi upset     Patient Measurements: Weight: 180 lb (81.647 kg)   Vital Signs: Temp: 102.7 F (39.3 C) (01/13 1135) Temp Source: Oral (01/13 1135) BP: 155/95 mmHg (01/13 1135) Pulse Rate: 101 (01/13 1135) Intake/Output from previous day:   Intake/Output from this shift: Total I/O In: 1000 [I.V.:1000] Out: -   Labs:  Recent Labs  06/16/14 1153 06/16/14 1156  WBC 13.7*  --   HGB 14.0 15.0  PLT 297  --   CREATININE  --  1.80*   Estimated Creatinine Clearance: 28.9 mL/min (by C-G formula based on Cr of 1.8). No results for input(s): VANCOTROUGH, VANCOPEAK, VANCORANDOM, GENTTROUGH, GENTPEAK, GENTRANDOM, TOBRATROUGH, TOBRAPEAK, TOBRARND, AMIKACINPEAK, AMIKACINTROU, AMIKACIN in the last 72 hours.    Medical History: Past Medical History  Diagnosis Date  . Hypertension   . Bipolar 1 disorder   . Hypothyroidism   . Memory difficulties   . HLD (hyperlipidemia)   . Diverticulitis large intestine    Microbiology: 1/13 blood x 2: collected 1/13 urine: collected  Anti-infectives: 1/13 >>cefepime>> 1/13 >>vancomycin>>  Assessment: 74 y/o F admitted through ED from rehab facility 1/13 with fever and AMS.  CXR showed unchanged mild subsegmental atelectasis or scar RLL.  Orders received to begin cefepime and vancomycin with pharmacy dosing assistance for r/o HCAP.   Azotemia noted.  Per [facility] records, baseline SCr ~ 1.2.   Goal of Therapy:  Appropriate antibiotic dosing for indication and renal function; eradication of infection. Vancomycin trough 15-20  Plan:  1. Vancomycin 1.5 grams IV x 1, then 1 gram IV q24h 2. Cefepime 2 grams IV x 1, then 1 gram IV q12h 3. Follow serum creatinine, cultures, clinical course, readjust dosages as  necessary. 4. Check vancomycin trough when appropriate unless early de-escalation occurs.  Elie Goodyandy Costa Jha, PharmD, BCPS Pager: [phone] 06/16/2014  12:43 PM

## 2014-06-16 NOTE — ED Notes (Signed)
Pt back from IR, being transported to floor

## 2014-06-16 NOTE — ED Provider Notes (Signed)
CSN: [id number]     Arrival date & time 06/16/14  1124 History   First MD Initiated Contact with Patient 06/16/14 1127     Chief Complaint  Patient presents with  . Altered Mental Status     (Consider location/radiation/quality/duration/timing/severity/associated sxs/prior Treatment) Patient is a 74 y.o. female presenting with altered mental status. The history is provided by the patient, the EMS personnel and a relative.  Altered Mental Status  level V caveat due to altered mental status Patient was sent in from rehabilitation. Fever and altered mental status. Discussing with EMS they do not know the last time she was more normal. They states that the nurses do not normally take care of her and she may have been altered yesterday also. In rehabilitation due to pneumonia. On arrival was minimally responsive. Decreased gag reflex. Good blood pressure. Will not follow commands so we'll look to voice.  Past Medical History  Diagnosis Date  . Hypertension   . Bipolar 1 disorder   . Hypothyroidism   . Memory difficulties   . HLD (hyperlipidemia)   . Diverticulitis large intestine   . Pneumonia    Past Surgical History  Procedure Laterality Date  . No past surgeries     Family History  Problem Relation Age of Onset  . Heart disease Father    History  Substance Use Topics  . Smoking status: Never Smoker   . Smokeless tobacco: Never Used  . Alcohol Use: No   OB History    No data available     Review of Systems  Unable to perform ROS     Allergies  Antihistamine decongestant and Codeine  Home Medications   Prior to Admission medications   Medication Sig Start Date End Date Taking? Authorizing Provider  amLODipine (NORVASC) 5 MG tablet Take 5 mg by mouth daily with breakfast.    Yes Historical Provider, MD  antiseptic oral rinse (BIOTENE) LIQD 15 mLs by Mouth Rinse route as needed for dry mouth.   Yes Historical Provider, MD  b complex vitamins capsule Take 1 capsule  by mouth daily with breakfast.   Yes Historical Provider, MD  benztropine (COGENTIN) 1 MG tablet Take 1 mg by mouth daily with breakfast.    Yes Historical Provider, MD  carbamazepine (TEGRETOL) 200 MG tablet Take 200 mg by mouth 2 (two) times daily.   Yes Historical Provider, MD  cholecalciferol (VITAMIN D) 1000 UNITS tablet Take 1,000 Units by mouth daily with breakfast.   Yes Historical Provider, MD  guaiFENesin-dextromethorphan (ROBITUSSIN DM) 100-10 MG/5ML syrup Take 5 mLs by mouth every 4 (four) hours as needed for cough.   Yes Historical Provider, MD  lactobacillus acidophilus (BACID) TABS tablet Take 1 tablet by mouth 2 (two) times daily.   Yes Historical Provider, MD  levothyroxine (SYNTHROID, LEVOTHROID) 150 MCG tablet Take 150 mcg by mouth daily before breakfast.   Yes Historical Provider, MD  lithium carbonate 300 MG capsule Take 300-600 mg by mouth 2 (two) times daily. Takes  in the morning and  in the evening   Yes Historical Provider, MD  LORazepam (ATIVAN) 0.5 MG tablet Take 0.5 mg by mouth every 6 (six) hours as needed for anxiety (and agitation).   Yes Historical Provider, MD  Maltodextrin-Xanthan Gum (RESOURCE THICKENUP CLEAR) POWD Use as indicated. 05/03/14  Yes Kathlen Mody, MD  phenol (CHLORASEPTIC) 1.4 % LIQD Use as directed 2 sprays in the mouth or throat every 2 (two) hours as needed for throat irritation / pain.  Yes Historical Provider, MD  senna (SENOKOT) 8.6 MG TABS tablet Take 1 tablet (8.6 mg total) by mouth 2 (two) times daily. 05/03/14  Yes Kathlen Mody, MD  simvastatin (ZOCOR) 20 MG tablet Take 20 mg by mouth at bedtime.    Yes Historical Provider, MD  temazepam (RESTORIL) 7.5 MG capsule Take 7.5 mg by mouth at bedtime.   Yes Historical Provider, MD  traMADol (ULTRAM) 50 MG tablet Take 25 mg by mouth every 6 (six) hours as needed (for pain).   Yes Historical Provider, MD  amoxicillin-clavulanate (AUGMENTIN) 875-125 MG per tablet Take 1 tablet by mouth 2  (two) times daily. Patient not taking: Reported on 06/16/2014 05/03/14   Kathlen Mody, MD  Water For Irrigation, Sterile (FREE WATER) SOLN Take 200 mLs by mouth every 8 (eight) hours. Patient not taking: Reported on 06/16/2014 05/03/14   Kathlen Mody, MD   BP 158/63 mmHg  Pulse 78  Temp(Src) 100.3 F (37.9 C) (Oral)  Resp 22  Wt 180 lb (81.647 kg)  SpO2 93% Physical Exam  Constitutional: She appears well-developed.  HENT:  Mouth is very dry  Eyes: Pupils are equal, round, and reactive to light.  Cardiovascular: Normal rate and regular rhythm.   Pulmonary/Chest: Effort normal.  Transmitted upper airway sounds  Abdominal: Soft. There is no tenderness.  Musculoskeletal: She exhibits no tenderness.  Neurological:  Will not follow commands. Breathing spontaneously. Decreased response to pain. Tolerated Foley catheter well.  Skin: Skin is warm.  Vitals reviewed.   ED Course  Procedures (including critical care time) Labs Review Labs Reviewed  CBC WITH DIFFERENTIAL - Abnormal; Notable for the following:    WBC 13.7 (*)    HCT 47.0 (*)    MCV 102.0 (*)    MCHC 29.8 (*)    Neutrophils Relative % 86 (*)    Neutro Abs 11.9 (*)    Lymphocytes Relative 6 (*)    Monocytes Absolute 1.1 (*)    All other components within normal limits  COMPREHENSIVE METABOLIC PANEL - Abnormal; Notable for the following:    Sodium 151 (*)    Chloride 121 (*)    Glucose, Bld 112 (*)    BUN 25 (*)    Creatinine, Ser 2.02 (*)    AST 64 (*)    ALT 45 (*)    GFR calc non Af Amer 23 (*)    GFR calc Af Amer 27 (*)    All other components within normal limits  TROPONIN I - Abnormal; Notable for the following:    Troponin I 0.09 (*)    All other components within normal limits  CARBAMAZEPINE LEVEL, TOTAL - Abnormal; Notable for the following:    Carbamazepine Lvl <2.0 (*)    All other components within normal limits  I-STAT CHEM 8, ED - Abnormal; Notable for the following:    Sodium 155 (*)     Chloride 122 (*)    BUN 31 (*)    Creatinine, Ser 1.80 (*)    Glucose, Bld 115 (*)    All other components within normal limits  CULTURE, BLOOD (ROUTINE X 2)  CULTURE, BLOOD (ROUTINE X 2)  URINE CULTURE  URINALYSIS, ROUTINE W REFLEX MICROSCOPIC  LITHIUM LEVEL  CARBAMAZEPINE LEVEL, FREE  INFLUENZA PANEL BY PCR (TYPE A & B, H1N1)  I-STAT CG4 LACTIC ACID, ED    Imaging Review Ct Head Wo Contrast  06/16/2014   CLINICAL DATA:  Altered mental status. Unable to communicate. Unable to walk.  EXAM:  CT HEAD WITHOUT CONTRAST  TECHNIQUE: Contiguous axial images were obtained from the base of the skull through the vertex without intravenous contrast.  COMPARISON:  10/07/2010  FINDINGS: No acute intracranial abnormality. Specifically, no hemorrhage, hydrocephalus, mass lesion, acute infarction, or significant intracranial injury. No acute calvarial abnormality. Visualized paranasal sinuses and mastoids clear. Orbital soft tissues unremarkable.  IMPRESSION: No acute intracranial abnormality.   Electronically Signed   By: Charlett NoseKevin  Dover M.D.   On: 06/16/2014 14:17   Dg Chest Port 1 View  06/16/2014   CLINICAL DATA:  Altered mental status.  EXAM: PORTABLE CHEST - 1 VIEW  COMPARISON:  Single view of the chest 05/03/2014 and 04/30/2014. PA and lateral chest 05/15/2010.  FINDINGS: Mild subsegmental atelectasis or scar in the right lung base appears unchanged. The left lung is clear. There is no pneumothorax or pleural effusion. Heart size is upper normal. There is no evidence of pulmonary edema. Scoliosis is noted.  IMPRESSION: No acute finding with mild subsegmental atelectasis or scar on the right base unchanged.   Electronically Signed   By: Drusilla Kannerhomas  Dalessio M.D.   On: 06/16/2014 11:53     EKG Interpretation   Date/Time:  Wednesday June 16 2014 11:33:39 EST Ventricular Rate:  105 PR Interval:  140 QRS Duration: 64 QT Interval:  373 QTC Calculation: 493 R Axis:   -8 Text Interpretation:  Sinus  tachycardia with irregular rate Probable LVH  with secondary repol abnrm Borderline prolonged QT interval Confirmed by  Rubin PayorPICKERING  MD, Harrold DonathNATHAN [phone]([id number]) on 06/16/2014 1:23:54 PM      MDM   Final diagnoses:  Fever    Patient with fever. Altered mental status. Chest x-ray and urine reassuring. Discussed with critical care. They feel decreased mental status may be due to dehydration. No clear cause of fever. Admitting hospitalist requested a lumbar puncture, however I don't think the patient was tolerated well the ER. I think technically would be very difficult to do. I  Requested a lumbar puncture fro but they state they cannot do one under sedation also, hospitalist was notified. Blood pressures been good. Lactic acid is normal. Creatinine is increased as is sodium.    CRITICAL CARE Performed by: Billee CashingPICKERING,Gorge Almanza R. Total critical care time: 30 Critical care time was exclusive of separately billable procedures and treating other patients. Critical care was necessary to treat or prevent imminent or life-threatening deterioration. Critical care was time spent personally by me on the following activities: development of treatment plan with patient and/or surrogate as well as nursing, discussions with consultants, evaluation of patient's response to treatment, examination of patient, obtaining history from patient or surrogate, ordering and performing treatments and interventions, ordering and review of laboratory studies, ordering and review of radiographic studies, pulse oximetry and re-evaluation of patient's condition.     Juliet RudeNathan R. Rubin PayorPickering, MD 06/16/14 [id number]

## 2014-06-16 NOTE — H&P (Signed)
[facility] History and Physical  [name] [name] ZOX:[id number] DOB: 04-20-1941 DOA: 06/16/2014   PCP: Astrid Divine, MD  Specialists: None  Chief Complaint: Altered mental status  HPI: [name] [name] is a 74 y.o. female who lives in a skilled nursing facility and has a history of hypertension, bipolar disorder, hypothyroidism, who was the in this hospital back in November and was treated for pneumonia. She also had acute renal failure at that time. These problems improved with treatment. She was then discharged to a skilled nursing facility. She is accompanied by her son today. She was sent over from there because of decreased level of consciousness. In the emergency department she was found to have a rectal temperature of 102F. The son saw her last about 4 days ago when she was doing well. According to the notes received from the nursing staff it appears that since yesterday patient has not been acting herself. She was more unresponsive today. There is no history of decreased oral intake the last few days. There is also no clear history of a fever at the skilled nursing facility. Patient is more responsive now than she was a few hours ago, but still unable to give any history whatsoever.  Home Medications: Prior to Admission medications   Medication Sig Start Date End Date Taking? Authorizing Provider  amLODipine (NORVASC) 5 MG tablet Take 5 mg by mouth daily with breakfast.    Yes Historical Provider, MD  antiseptic oral rinse (BIOTENE) LIQD 15 mLs by Mouth Rinse route as needed for dry mouth.   Yes Historical Provider, MD  b complex vitamins capsule Take 1 capsule by mouth daily with breakfast.   Yes Historical Provider, MD  benztropine (COGENTIN) 1 MG tablet Take 1 mg by mouth daily with breakfast.    Yes Historical Provider, MD  carbamazepine (TEGRETOL) 200 MG tablet Take 200 mg by mouth 2 (two) times daily.   Yes Historical Provider, MD  cholecalciferol (VITAMIN D) 1000  UNITS tablet Take 1,000 Units by mouth daily with breakfast.   Yes Historical Provider, MD  guaiFENesin-dextromethorphan (ROBITUSSIN DM) 100-10 MG/5ML syrup Take 5 mLs by mouth every 4 (four) hours as needed for cough.   Yes Historical Provider, MD  lactobacillus acidophilus (BACID) TABS tablet Take 1 tablet by mouth 2 (two) times daily.   Yes Historical Provider, MD  levothyroxine (SYNTHROID, LEVOTHROID) 150 MCG tablet Take 150 mcg by mouth daily before breakfast.   Yes Historical Provider, MD  lithium carbonate 300 MG capsule Take 300-600 mg by mouth 2 (two) times daily. Takes 300mg  in the morning and 600mg  in the evening   Yes Historical Provider, MD  LORazepam (ATIVAN) 0.5 MG tablet Take 0.5 mg by mouth every 6 (six) hours as needed for anxiety (and agitation).   Yes Historical Provider, MD  Maltodextrin-Xanthan Gum (RESOURCE THICKENUP CLEAR) POWD Use as indicated. 05/03/14  Yes Kathlen Mody, MD  phenol (CHLORASEPTIC) 1.4 % LIQD Use as directed 2 sprays in the mouth or throat every 2 (two) hours as needed for throat irritation / pain.   Yes Historical Provider, MD  senna (SENOKOT) 8.6 MG TABS tablet Take 1 tablet (8.6 mg total) by mouth 2 (two) times daily. 05/03/14  Yes Kathlen Mody, MD  simvastatin (ZOCOR) 20 MG tablet Take 20 mg by mouth at bedtime.    Yes Historical Provider, MD  temazepam (RESTORIL) 7.5 MG capsule Take 7.5 mg by mouth at bedtime.   Yes Historical Provider, MD  traMADol (ULTRAM) 50 MG tablet Take 25  mg by mouth every 6 (six) hours as needed (for pain).   Yes Historical Provider, MD  amoxicillin-clavulanate (AUGMENTIN) 875-125 MG per tablet Take 1 tablet by mouth 2 (two) times daily. Patient not taking: Reported on 06/16/2014 05/03/14   Kathlen ModyVijaya Akula, MD  Water For Irrigation, Sterile (FREE WATER) SOLN Take 200 mLs by mouth every 8 (eight) hours. Patient not taking: Reported on 06/16/2014 05/03/14   Kathlen ModyVijaya Akula, MD    Allergies:  Allergies  Allergen Reactions  .  Antihistamine Decongestant [Triprolidine-Pse] Diarrhea  . Codeine Other (See Comments)    Gi upset     Past Medical History: Past Medical History  Diagnosis Date  . Hypertension   . Bipolar 1 disorder   . Hypothyroidism   . Memory difficulties   . HLD (hyperlipidemia)   . Diverticulitis large intestine     History reviewed. No pertinent past surgical history.  Social History: She lives in a skilled nursing facility. Other social history is unobtainable at this time.  Family History:  Family History  Problem Relation Age of Onset  . Heart disease Father      Review of Systems - unable to obtain due to acute encephalopathy  Physical Examination  Filed Vitals:   06/16/14 1220 06/16/14 1230 06/16/14 1245 06/16/14 1300  BP: 155/77 164/74 160/66 168/78  Pulse: 82 88 87 82  Temp:      TempSrc:      Resp: 27 26 22 24   Weight:      SpO2: 95%  95% 95%    BP 168/78 mmHg  Pulse 82  Temp(Src) 102.7 F (39.3 C) (Oral)  Resp 24  Wt 81.647 kg (180 lb)  SpO2 95%  General appearance: alert, distracted, no distress, moderately obese and slowed mentation Head: Normocephalic, without obvious abnormality, atraumatic Eyes: conjunctivae/corneas clear. PERRL, EOM's intact. Throat: Very dry mucous membranes Neck: no adenopathy, no carotid bruit, no JVD, supple, symmetrical, trachea midline and thyroid not enlarged, symmetric, no tenderness/mass/nodules Resp: Coarse breath sounds bilaterally without any clear rhonchi or wheezing. Cardio: regular rate and rhythm, S1, S2 normal, no murmur, click, rub or gallop GI: soft, non-tender; bowel sounds normal; no masses,  no organomegaly Extremities: extremities normal, atraumatic, no cyanosis or edema Pulses: 2+ and symmetric Skin: Skin color, texture, turgor normal. No rashes or lesions Lymph nodes: Cervical, supraclavicular, and axillary nodes normal. Neurologic: No obvious facial droop. Moving all her extremities. No obvious focal  deficits noted currently.  Laboratory Data: Results for orders placed or performed during the hospital encounter of 06/16/14 (from the past 48 hour(s))  Urinalysis with microscopic     Status: None   Collection Time: 06/16/14 11:38 AM  Result Value Ref Range   Color, Urine YELLOW YELLOW   APPearance CLEAR CLEAR   Specific Gravity, Urine 1.012 1.005 - 1.030   pH 6.5 5.0 - 8.0   Glucose, UA NEGATIVE NEGATIVE mg/dL   Hgb urine dipstick NEGATIVE NEGATIVE   Bilirubin Urine NEGATIVE NEGATIVE   Ketones, ur NEGATIVE NEGATIVE mg/dL   Protein, ur NEGATIVE NEGATIVE mg/dL   Urobilinogen, UA 0.2 0.0 - 1.0 mg/dL   Nitrite NEGATIVE NEGATIVE   Leukocytes, UA NEGATIVE NEGATIVE    Comment: MICROSCOPIC NOT DONE ON URINES WITH NEGATIVE PROTEIN, BLOOD, LEUKOCYTES, NITRITE, OR GLUCOSE <1000 mg/dL.  CBC WITH DIFFERENTIAL     Status: Abnormal   Collection Time: 06/16/14 11:53 AM  Result Value Ref Range   WBC 13.7 (H) 4.0 - 10.5 K/uL   RBC 4.61 3.87 -  5.11 MIL/uL   Hemoglobin 14.0 12.0 - 15.0 g/dL   HCT 95.6 (H) 21.3 - 08.6 %   MCV 102.0 (H) 78.0 - 100.0 fL   MCH 30.4 26.0 - 34.0 pg   MCHC 29.8 (L) 30.0 - 36.0 g/dL   RDW 57.8 46.9 - 62.9 %   Platelets 297 150 - 400 K/uL   Neutrophils Relative % 86 (H) 43 - 77 %   Neutro Abs 11.9 (H) 1.7 - 7.7 K/uL   Lymphocytes Relative 6 (L) 12 - 46 %   Lymphs Abs 0.8 0.7 - 4.0 K/uL   Monocytes Relative 8 3 - 12 %   Monocytes Absolute 1.1 (H) 0.1 - 1.0 K/uL   Eosinophils Relative 0 0 - 5 %   Eosinophils Absolute 0.0 0.0 - 0.7 K/uL   Basophils Relative 0 0 - 1 %   Basophils Absolute 0.0 0.0 - 0.1 K/uL  I-Stat Chem 8, ED     Status: Abnormal   Collection Time: 06/16/14 11:56 AM  Result Value Ref Range   Sodium 155 (H) 135 - 145 mmol/L   Potassium 4.1 3.5 - 5.1 mmol/L   Chloride 122 (H) 96 - 112 mEq/L   BUN 31 (H) 6 - 23 mg/dL   Creatinine, Ser 5.28 (H) 0.50 - 1.10 mg/dL   Glucose, Bld 413 (H) 70 - 99 mg/dL   Calcium, Ion 2.44 0.10 - 1.30 mmol/L   TCO2 22 0  - 100 mmol/L   Hemoglobin 15.0 12.0 - 15.0 g/dL   HCT 27.2 53.6 - 64.4 %  I-Stat CG4 Lactic Acid, ED     Status: None   Collection Time: 06/16/14 11:57 AM  Result Value Ref Range   Lactic Acid, Venous 1.01 0.5 - 2.2 mmol/L    Radiology Reports: Dg Chest Port 1 View  06/16/2014   CLINICAL DATA:  Altered mental status.  EXAM: PORTABLE CHEST - 1 VIEW  COMPARISON:  Single view of the chest 05/03/2014 and 04/30/2014. PA and lateral chest 05/15/2010.  FINDINGS: Mild subsegmental atelectasis or scar in the right lung base appears unchanged. The left lung is clear. There is no pneumothorax or pleural effusion. Heart size is upper normal. There is no evidence of pulmonary edema. Scoliosis is noted.  IMPRESSION: No acute finding with mild subsegmental atelectasis or scar on the right base unchanged.   Electronically Signed   By: Drusilla Kanner M.D.   On: 06/16/2014 11:53    Electrocardiogram: Sinus tachycardia at 105 bpm. left axis deviation. Q waves in lead 3 and aVF. Nonspecific T wave changes. No concerning ST changes are noted. Not as prominent Q waves noted in previous EKGs.  Problem List  Principal Problem:   Acute encephalopathy Active Problems:   Bipolar 1 disorder   Essential hypertension   Hypothyroid   Acute kidney injury   Fever   Hypernatremia   Dehydration   Assessment: This is a 74 year old Caucasian female who comes in from a skilled nursing facility with alteration in mental status along with fever. She appears to have acute encephalopathy which could be due to metabolic or toxic reason. She has a fever without a clear source. She appears to be dehydrated and has hypernatremia and acute renal failure. She has nonspecific EKG changes. She is noted to be on lithium for her history of bipolar disorder  Plan: #1 acute encephalopathy: Etiology is unclear. Hypernatremia and dehydration may be playing a big role considering her improvement with IV fluids. We will, however, proceed  with a CT scan of the head to ensure there is no acute intracranial process. Find out reason for fever. Monitor mental status closely with neuro checks.  #2 Fever: Proceed with LP by IR. ED physician is arranging this. Check influenza. No other infectious source identified on chest x-ray or UA. Check LFTs. Agree with broad-spectrum coverage. Blood cultures will be followed up on. Lactic acid is reassuringly normal.  #3 Nonspecific EKG changes: We'll check a troponin. Repeat EKG in the morning. Chest x-ray does not show any acute findings.  #4 history of bipolar disorder: She is noted to be on lithium and carbamazepine. We'll check levels of both.  #5 Acute kidney injury: Most likely due to prerenal reasons. Continue IV fluids and repeat labs in the morning. Monitor urine output.  #6 history of hypothyroidism: Check TSH and free T4. Resume medications when she is able to take orally.  #7 history of essential hypertension: Monitor blood pressures closely.   DVT Prophylaxis: Heparin Code Status: Full code Family Communication: Discussed with the patient's son  Disposition Plan: Admit to stepdown unit.   Further management decisions will depend on results of further testing and patient's response to treatment.   [facility]  [facility] Pager [phone]  If 7PM-7AM, please contact night-coverage [email or web address] Password [email or web address]  06/16/2014, 1:40 PM

## 2014-06-16 NOTE — Procedures (Signed)
CLINICAL DATA: [Mental status changes. Unresponsive. Fever.] EXAM: DIAGNOSTIC LUMBAR PUNCTURE UNDER FLUOROSCOPIC GUIDANCE FLUOROSCOPY TIME: [1 min and 47 seconds] PROCEDURE: Informed consent was obtained from the the patient's son (secondary to patient's mental status changes) prior to the procedure, including potential complications of headache, allergy, and pain. With the patient prone, the lower back was prepped with Betadine. 1% Lidocaine was used for local anesthesia. Lumbar puncture was performed at the [L2-3] level using a [20] gauge needle with return of [clear] CSF with an opening pressure of [27] cm water. [9-10]ml of CSF were obtained for laboratory studies. The patient tolerated the procedure well and there were no apparent complications. IMPRESSION: [Uncomplicated lumbar puncture, as detailed above. Opening pressure of approximately 27 cm of CSF.]

## 2014-06-17 ENCOUNTER — Encounter (HOSPITAL_COMMUNITY): Payer: Self-pay | Admitting: Radiology

## 2014-06-17 ENCOUNTER — Inpatient Hospital Stay (HOSPITAL_COMMUNITY): Payer: Medicare Other

## 2014-06-17 DIAGNOSIS — R7401 Elevation of levels of liver transaminase levels: Secondary | ICD-10-CM | POA: Diagnosis present

## 2014-06-17 DIAGNOSIS — R6510 Systemic inflammatory response syndrome (SIRS) of non-infectious origin without acute organ dysfunction: Secondary | ICD-10-CM | POA: Diagnosis present

## 2014-06-17 DIAGNOSIS — R7989 Other specified abnormal findings of blood chemistry: Secondary | ICD-10-CM | POA: Diagnosis present

## 2014-06-17 DIAGNOSIS — R778 Other specified abnormalities of plasma proteins: Secondary | ICD-10-CM | POA: Diagnosis present

## 2014-06-17 DIAGNOSIS — R740 Nonspecific elevation of levels of transaminase and lactic acid dehydrogenase [LDH]: Secondary | ICD-10-CM

## 2014-06-17 LAB — COMPREHENSIVE METABOLIC PANEL
ALBUMIN: 3.6 g/dL (ref 3.5–5.2)
ALK PHOS: 89 U/L (ref 39–117)
ALT: 45 U/L — ABNORMAL HIGH (ref 0–35)
AST: 53 U/L — AB (ref 0–37)
Anion gap: 9 (ref 5–15)
BUN: 29 mg/dL — ABNORMAL HIGH (ref 6–23)
CHLORIDE: 129 meq/L — AB (ref 96–112)
CO2: 25 mmol/L (ref 19–32)
CREATININE: 1.58 mg/dL — AB (ref 0.50–1.10)
Calcium: 10 mg/dL (ref 8.4–10.5)
GFR calc Af Amer: 36 mL/min — ABNORMAL LOW (ref >90)
GFR calc non Af Amer: 31 mL/min — ABNORMAL LOW (ref >90)
GLUCOSE: 106 mg/dL — AB (ref 70–99)
Potassium: 3.7 mmol/L (ref 3.5–5.1)
Sodium: 163 mmol/L (ref 135–145)
Total Bilirubin: 0.8 mg/dL (ref 0.3–1.2)
Total Protein: 6.6 g/dL (ref 6.0–8.3)

## 2014-06-17 LAB — CBC
HEMATOCRIT: 46.8 % — AB (ref 36.0–46.0)
HEMOGLOBIN: 13.7 g/dL (ref 12.0–15.0)
MCH: 30.4 pg (ref 26.0–34.0)
MCHC: 29.3 g/dL — AB (ref 30.0–36.0)
MCV: 103.8 fL — AB (ref 78.0–100.0)
Platelets: 234 10*3/uL (ref 150–400)
RBC: 4.51 MIL/uL (ref 3.87–5.11)
RDW: 13.4 % (ref 11.5–15.5)
WBC: 12 10*3/uL — ABNORMAL HIGH (ref 4.0–10.5)

## 2014-06-17 LAB — URINE CULTURE
COLONY COUNT: NO GROWTH
Culture: NO GROWTH

## 2014-06-17 LAB — TROPONIN I: Troponin I: 0.08 ng/mL — ABNORMAL HIGH (ref <0.031)

## 2014-06-17 LAB — INFLUENZA PANEL BY PCR (TYPE A & B)
H1N1FLUPCR: NOT DETECTED
INFLAPCR: NEGATIVE
Influenza B By PCR: NEGATIVE

## 2014-06-17 LAB — TSH: TSH: 0.212 u[IU]/mL — AB (ref 0.350–4.500)

## 2014-06-17 LAB — T4, FREE: Free T4: 1.33 ng/dL (ref 0.80–1.80)

## 2014-06-17 MED ORDER — IOHEXOL 300 MG/ML  SOLN: 50.0000 mL | INTRAMUSCULAR | Status: DC

## 2014-06-17 MED ORDER — HYDRALAZINE HCL 20 MG/ML IJ SOLN
10.0000 mg | Freq: Four times a day (QID) | INTRAMUSCULAR | Status: DC | PRN
  Administered 2014-06-17: 10 mg via INTRAVENOUS
  Filled 2014-06-17: qty 1

## 2014-06-17 MED ORDER — PANTOPRAZOLE SODIUM 40 MG IV SOLR
40.0000 mg | INTRAVENOUS | Status: DC
  Administered 2014-06-17 – 2014-06-18 (×2): 40 mg via INTRAVENOUS
  Filled 2014-06-17 (×2): qty 40

## 2014-06-17 MED ORDER — ASPIRIN 81 MG PO CHEW
81.0000 mg | CHEWABLE_TABLET | Freq: Every day | ORAL | Status: DC
  Filled 2014-06-17: qty 1

## 2014-06-17 MED ORDER — CHLORHEXIDINE GLUCONATE CLOTH 2 % EX PADS
6.0000 | MEDICATED_PAD | Freq: Every day | CUTANEOUS | Status: AC
  Administered 2014-06-17 – 2014-06-22 (×5): 6 via TOPICAL

## 2014-06-17 MED ORDER — DEXTROSE-NACL 5-0.45 % IV SOLN
INTRAVENOUS | Status: DC
  Administered 2014-06-17 – 2014-06-18 (×2): via INTRAVENOUS

## 2014-06-17 MED ORDER — MUPIROCIN 2 % EX OINT
1.0000 "application " | TOPICAL_OINTMENT | Freq: Two times a day (BID) | CUTANEOUS | Status: AC
  Administered 2014-06-17 – 2014-06-21 (×10): 1 via NASAL
  Filled 2014-06-17 (×3): qty 22

## 2014-06-17 NOTE — Progress Notes (Signed)
[facility] PROGRESS NOTE  [name] Arhoebe Heacox ZOX:[id number] DOB: 01-04-1941 DOA: 06/16/2014 PCP: Astrid DivineGRIFFIN,ELAINE COLLINS, MD  Summary/Subjective I have seen and examined Ms [name] [name] at bedside and reviewed her chart. Appreciate Critical Care. [name] [name] is a 74 y.o. female who lives in a skilled nursing facility and has a history of hypertension, bipolar disorder, hypothyroidism, who was the in this hospital back in November and was treated for pneumonia and was admitted on 06/16/13 from a SNF with complaints of decreased level of consciousness associated with fever of 102F in ED with concern for Acute Encephalopathy which could be due to metabolic or toxic reason. She was also dehydrated with hypernatremia(Na 151) and acute kidney injury(bun/cr-25/2.02. She also had leucocytosis of [id number]. CXR/CT brain were unrevealing. Lumbar puncture also unremarkable.  She was therefore placed on broad spectrum antibiotics and IVF with improvement in wbc to 12000, bun/cr- 29/1.58 but sodium has increased to 163 today. Septic work up(including BC/UC/CSF culture) is unrevealing to date. Of note is that patient was on Lithium(level normal) for Bipolar disorder. Her troponin was slightly elevated with some nonspecific EKG changes. The elevated troponin/liver enzymes likely related to Sepsis. However, the source of sepsis remains unclear. Patient was apparently in respiratory distress in the ED-?PNA and or abdominal source. Will therefore obtain CT chest/abdomen/pelvis without contrast in light of acute kidney injury, to try to localize possible source of infection, change fluids to D5W1/2NS as sodium increasing, place foley catheter to monitor I/O, obtain 2D Echo to assess EF/WMA(if normal then enzymes likely elevated due to sepsis/aki), and add ASA/Hydralazine as needed. Patient's condition remains critical hence will keep in ICU today. Time spent on this critical care follow up is 45 minutes. She is awake but not responding  to my questions. Plan Acute encephalopathy/Fever/Sepsis/transaminitis  Follow septic work up.  CT chest/abdomen/pelvis  Continue Vanc/Cefepime per pharmacy, day 2/7-10 Acute kidney injury/Hypernatremia/Dehydration  Insert foley  Change IVF to D5W1/2NS Elevated troponin/Essential hypertension  ASA  Echo  Hydralazine prn Bipolar 1 disorder  Antipsychotics on hold  Sort out meds once patient more with it. Hypothyroid  Tsh normal  Resume Synthroid once patient can take oral. DVT/GI Prophylaxis  Heparin SQ  Protonix iv while NPO.  Code Status: Full Code. Family Communication: Called son Otis PeakMatthew Odonohue at [phone] and updated him on patient's condition. He mentions that patient was on ?haldol injections every other week till recently Disposition Plan: Keep in ICU today.   Consultants:  PCCM  Procedures:  LP   Antibiotics:  Vancomycin 06/16/13>  Cefepime 06/16/13>  Objective: Filed Vitals:   06/17/14 0600  BP: 154/82  Pulse: 89  Temp:   Resp: 29    Intake/Output Summary (Last 24 hours) at 06/17/14 0753 Last data filed at 06/17/14 0600  Gross per 24 hour  Intake 2962.5 ml  Output      0 ml  Net 2962.5 ml   Filed Weights   06/16/14 1142 06/16/14 1700  Weight: 81.647 kg (180 lb) 73.2 kg (161 lb 6 oz)    Exam:   General:  Nonverbal. Seems comfortable at rest.  Cardiovascular: Sinus tachycardia, regular. Precordial murmur. S1S2 normal.  Respiratory: Good air entry bilaterally. Scant rhonchi.  Abdomen: soft. +BS  Musculoskeletal: No pedal edema.  Data Reviewed: Basic Metabolic Panel:  Recent Labs Lab 06/16/14 1153 06/16/14 1156 06/17/14 0530  NA 151* 155* 163*  K 3.9 4.1 3.7  CL 121* 122* 129*  CO2 24  --  25  GLUCOSE 112* 115* 106*  BUN 25*  31* 29*  CREATININE 2.02* 1.80* 1.58*  CALCIUM 9.6  --  10.0   Liver Function Tests:  Recent Labs Lab 06/16/14 1153 06/17/14 0530  AST 64* 53*  ALT 45* 45*  ALKPHOS 96 89   BILITOT 0.7 0.8  PROT 6.8 6.6  ALBUMIN 3.7 3.6   No results for input(s): LIPASE, AMYLASE in the last 168 hours. No results for input(s): AMMONIA in the last 168 hours. CBC:  Recent Labs Lab 06/16/14 1153 06/16/14 1156 06/17/14 0530  WBC 13.7*  --  12.0*  NEUTROABS 11.9*  --   --   HGB 14.0 15.0 13.7  HCT 47.0* 44.0 46.8*  MCV 102.0*  --  103.8*  PLT 297  --  234   Cardiac Enzymes:  Recent Labs Lab 06/16/14 1153 06/16/14 2154 06/17/14 0530  TROPONINI 0.09* 0.08* 0.08*   BNP (last 3 results) No results for input(s): PROBNP in the last 8760 hours. CBG: No results for input(s): GLUCAP in the last 168 hours.  Recent Results (from the past 240 hour(s))  Gram stain     Status: None   Collection Time: 06/16/14  5:07 PM  Result Value Ref Range Status   Specimen Description CSF  Final   Special Requests NONE  Final   Gram Stain   Final    NO ORGANISMS SEEN NO WBC SEEN CYTOSPIN Gram Stain Report Called to,Read Back By and Verified With: ALDRIDGE,D. RN  ON 1.13.16 BY MCCOY,N.    Report Status 06/16/2014 FINAL  Final  CSF culture     Status: None (Preliminary result)   Collection Time: 06/16/14  5:07 PM  Result Value Ref Range Status   Specimen Description CSF  Final   Special Requests NONE  Final   Gram Stain   Final    NO WBC SEEN NO ORGANISMS SEEN Performed at [facility]    Culture PENDING  Incomplete   Report Status PENDING  Incomplete  MRSA PCR Screening     Status: Abnormal   Collection Time: 06/16/14  5:19 PM  Result Value Ref Range Status   MRSA by PCR POSITIVE (A) NEGATIVE Final    Comment:        The GeneXpert MRSA Assay (FDA approved for NASAL specimens only), is one component of a comprehensive MRSA colonization surveillance program. It is not intended to diagnose MRSA infection nor to guide or monitor treatment for MRSA infections. RESULT CALLED TO, READ BACK BY AND VERIFIED WITH: C.DENNY,RN AT 2357 ON 06/16/14 BY SHEA.W       Studies: Ct Head Wo Contrast  06/16/2014   CLINICAL DATA:  Altered mental status. Unable to communicate. Unable to walk.  EXAM: CT HEAD WITHOUT CONTRAST  TECHNIQUE: Contiguous axial images were obtained from the base of the skull through the vertex without intravenous contrast.  COMPARISON:  10/07/2010  FINDINGS: No acute intracranial abnormality. Specifically, no hemorrhage, hydrocephalus, mass lesion, acute infarction, or significant intracranial injury. No acute calvarial abnormality. Visualized paranasal sinuses and mastoids clear. Orbital soft tissues unremarkable.  IMPRESSION: No acute intracranial abnormality.   Electronically Signed   By: Charlett Nose M.D.   On: 06/16/2014 14:17   Dg Chest Port 1 View  06/16/2014   CLINICAL DATA:  Altered mental status.  EXAM: PORTABLE CHEST - 1 VIEW  COMPARISON:  Single view of the chest 05/03/2014 and 04/30/2014. PA and lateral chest 05/15/2010.  FINDINGS: Mild subsegmental atelectasis or scar in the right lung base appears unchanged. The left lung is clear.  There is no pneumothorax or pleural effusion. Heart size is upper normal. There is no evidence of pulmonary edema. Scoliosis is noted.  IMPRESSION: No acute finding with mild subsegmental atelectasis or scar on the right base unchanged.   Electronically Signed   By: Drusilla Kanner M.D.   On: 06/16/2014 11:53   Dg Lumbar Puncture Fluoro Guide  06/16/2014   CLINICAL DATA:  Mental status changes.  Unresponsive.  Fever.  EXAM: DIAGNOSTIC LUMBAR PUNCTURE UNDER FLUOROSCOPIC GUIDANCE  FLUOROSCOPY TIME:  1 min and 47 seconds  PROCEDURE: Informed consent was obtained from the the patient's son (secondary to patient's mental status changes) prior to the procedure, including potential complications of headache, allergy, and pain. With the patient prone, the lower back was prepped with Betadine. 1% Lidocaine was used for local anesthesia. Lumbar puncture was performed at the L2-3 level using a 20 gauge needle with  return of clear CSF with an opening pressure of 27 cm water. 9-28ml of CSF were obtained for laboratory studies. The patient tolerated the procedure well and there were no apparent complications.  IMPRESSION: Uncomplicated lumbar puncture, as detailed above.  Opening pressure of approximately 27 cm of CSF.   Electronically Signed   By: Jeronimo Greaves M.D.   On: 06/16/2014 17:20    Scheduled Meds: . antiseptic oral rinse  7 mL Mouth Rinse q12n4p  . aspirin  81 mg Oral Daily  . ceFEPime (MAXIPIME) IV  1 g Intravenous Q12H  . chlorhexidine  15 mL Mouth Rinse BID  . Chlorhexidine Gluconate Cloth  6 each Topical Q0600  . heparin  5,000 Units Subcutaneous 3 times per day  . mupirocin ointment  1 application Nasal BID  . sodium chloride  3 mL Intravenous Q12H  . thiamine  100 mg Intravenous Daily  . vancomycin  1,000 mg Intravenous Q24H   Continuous Infusions: . dextrose 5 % and 0.45% NaCl       Time spent: 45 minutes.    Darren Nodal  [facility] Pager [phone]. If 7PM-7AM, please contact night-coverage at [email or web address], password [facility] 06/17/2014, 7:53 AM  LOS: 1 day

## 2014-06-17 NOTE — Care Management Note (Addendum)
    Page 1 of 1   06/22/2014     11:43:19 AM CARE MANAGEMENT NOTE 06/22/2014  Patient:  [name],[name]   Account Number:  [id number]  Date Initiated:  06/17/2014  Documentation initiated by:  Lanier ClamMAHABIR,Niaomi Cartaya  Subjective/Objective Assessment:   74 y/o f admitted w/Acute encephalopathy.     Action/Plan:   From [name].   Anticipated DC Date:  06/24/2014   Anticipated DC Plan:  SKILLED NURSING FACILITY         Choice offered to / List presented to:             Status of service:  In process, will continue to follow Medicare Important Message given?   (If response is "NO", the following Medicare IM given date fields will be blank) Date Medicare IM given:   Medicare IM given by:   Date Additional Medicare IM given:   Additional Medicare IM given by:    Discharge Disposition:    Per UR Regulation:  Reviewed for med. necessity/level of care/duration of stay  If discussed at Long Length of Stay Meetings, dates discussed:   06/22/2014    Comments:  06/22/14 Lanier ClamKathy Chala Gul RN BSN NCM [phone] Extubated.iv abx x3.D/C plan return SNF.  06/21/14 Lanier ClamKathy Gracieann Stannard RN BSN NCM [phone] Vent,gastric tube,iv abx x3.PCCM/ID following.D/C [facility]  06/17/14 Lanier ClamKathy Ranay Ketter RN, BSN NCM [phone] Monitor progress for d/c needs.

## 2014-06-17 NOTE — Progress Notes (Signed)
Utilization review completed.  

## 2014-06-17 NOTE — Progress Notes (Signed)
Will transfer patient out of the unit per Pulmonary recommendation-to telemetry when bed becomes available.

## 2014-06-17 NOTE — Progress Notes (Signed)
CRITICAL VALUE ALERT  Critical value received:   Sodium 163 (HH)   Date of notification:  06/17/14  Time of notification: 0620  Critical value read back:Yes.    Nurse who received alert:  Andreas Newportashanda Shermon Bozzi,RN  NP notified (1st page): K.Schorr, NP  Time of first page:  0621  Awaiting response.

## 2014-06-18 ENCOUNTER — Other Ambulatory Visit: Payer: Self-pay

## 2014-06-18 ENCOUNTER — Inpatient Hospital Stay (HOSPITAL_COMMUNITY): Payer: Medicare Other

## 2014-06-18 ENCOUNTER — Inpatient Hospital Stay (HOSPITAL_COMMUNITY): Payer: Medicare Other | Admitting: Anesthesiology

## 2014-06-18 DIAGNOSIS — I517 Cardiomegaly: Secondary | ICD-10-CM

## 2014-06-18 DIAGNOSIS — N179 Acute kidney failure, unspecified: Secondary | ICD-10-CM

## 2014-06-18 DIAGNOSIS — I70209 Unspecified atherosclerosis of native arteries of extremities, unspecified extremity: Secondary | ICD-10-CM | POA: Diagnosis present

## 2014-06-18 DIAGNOSIS — G9340 Encephalopathy, unspecified: Secondary | ICD-10-CM

## 2014-06-18 DIAGNOSIS — D7589 Other specified diseases of blood and blood-forming organs: Secondary | ICD-10-CM | POA: Diagnosis present

## 2014-06-18 DIAGNOSIS — J9601 Acute respiratory failure with hypoxia: Secondary | ICD-10-CM

## 2014-06-18 DIAGNOSIS — A419 Sepsis, unspecified organism: Secondary | ICD-10-CM

## 2014-06-18 LAB — COMPREHENSIVE METABOLIC PANEL
ALBUMIN: 3.5 g/dL (ref 3.5–5.2)
ALT: 37 U/L — ABNORMAL HIGH (ref 0–35)
ALT: 56 U/L — AB (ref 0–35)
AST: 35 U/L (ref 0–37)
AST: 70 U/L — AB (ref 0–37)
Albumin: 3.1 g/dL — ABNORMAL LOW (ref 3.5–5.2)
Alkaline Phosphatase: 88 U/L (ref 39–117)
Alkaline Phosphatase: 83 U/L (ref 39–117)
BUN: 30 mg/dL — ABNORMAL HIGH (ref 6–23)
BUN: 29 mg/dL — ABNORMAL HIGH (ref 6–23)
CALCIUM: 10.1 mg/dL (ref 8.4–10.5)
CALCIUM: 9.1 mg/dL (ref 8.4–10.5)
CO2: 25 mmol/L (ref 19–32)
CO2: 21 mmol/L (ref 19–32)
Chloride: >130 mEq/L (ref 96–112)
Chloride: >130 mEq/L (ref 96–112)
Creatinine, Ser: 1.98 mg/dL — ABNORMAL HIGH (ref 0.50–1.10)
Creatinine, Ser: 2.26 mg/dL — ABNORMAL HIGH (ref 0.50–1.10)
GFR calc Af Amer: 27 mL/min — ABNORMAL LOW (ref >90)
GFR calc Af Amer: 23 mL/min — ABNORMAL LOW (ref >90)
GFR, EST NON AFRICAN AMERICAN: 24 mL/min — AB (ref >90)
GFR, EST NON AFRICAN AMERICAN: 20 mL/min — AB (ref >90)
GLUCOSE: 162 mg/dL — AB (ref 70–99)
GLUCOSE: 277 mg/dL — AB (ref 70–99)
POTASSIUM: 3.8 mmol/L (ref 3.5–5.1)
Potassium: 3.8 mmol/L (ref 3.5–5.1)
SODIUM: 168 mmol/L — AB (ref 135–145)
Sodium: 158 mmol/L — ABNORMAL HIGH (ref 135–145)
Total Bilirubin: 0.5 mg/dL (ref 0.3–1.2)
Total Bilirubin: 0.7 mg/dL (ref 0.3–1.2)
Total Protein: 6.6 g/dL (ref 6.0–8.3)
Total Protein: 6.2 g/dL (ref 6.0–8.3)

## 2014-06-18 LAB — BLOOD GAS, ARTERIAL
Acid-base deficit: 5.3 mmol/L — ABNORMAL HIGH (ref 0.0–2.0)
Bicarbonate: 21.8 mEq/L (ref 20.0–24.0)
Drawn by: 235321
FIO2: 1 %
O2 Saturation: 99.4 %
PEEP/CPAP: 10 cmH2O
PH ART: 7.255 — AB (ref 7.350–7.450)
Patient temperature: 98.6
RATE: 14 resp/min
TCO2: 20 mmol/L (ref 0–100)
VT: 470 mL
pCO2 arterial: 50.8 mmHg — ABNORMAL HIGH (ref 35.0–45.0)
pO2, Arterial: 199 mmHg — ABNORMAL HIGH (ref 80.0–100.0)

## 2014-06-18 LAB — CBC
HCT: 51.2 % — ABNORMAL HIGH (ref 36.0–46.0)
HCT: 49.8 % — ABNORMAL HIGH (ref 36.0–46.0)
HEMOGLOBIN: 14.8 g/dL (ref 12.0–15.0)
HEMOGLOBIN: 14.1 g/dL (ref 12.0–15.0)
MCH: 30.8 pg (ref 26.0–34.0)
MCH: 30.8 pg (ref 26.0–34.0)
MCHC: 28.9 g/dL — ABNORMAL LOW (ref 30.0–36.0)
MCHC: 28.3 g/dL — ABNORMAL LOW (ref 30.0–36.0)
MCV: 106.4 fL — ABNORMAL HIGH (ref 78.0–100.0)
MCV: 108.7 fL — AB (ref 78.0–100.0)
Platelets: 240 10*3/uL (ref 150–400)
Platelets: 185 10*3/uL (ref 150–400)
RBC: 4.81 MIL/uL (ref 3.87–5.11)
RBC: 4.58 MIL/uL (ref 3.87–5.11)
RDW: 14 % (ref 11.5–15.5)
RDW: 14 % (ref 11.5–15.5)
WBC: 13.4 10*3/uL — AB (ref 4.0–10.5)
WBC: 12.6 10*3/uL — ABNORMAL HIGH (ref 4.0–10.5)

## 2014-06-18 LAB — BASIC METABOLIC PANEL
BUN: 31 mg/dL — AB (ref 6–23)
BUN: 30 mg/dL — AB (ref 6–23)
BUN: 30 mg/dL — ABNORMAL HIGH (ref 6–23)
CO2: 25 mmol/L (ref 19–32)
CO2: 28 mmol/L (ref 19–32)
CO2: 27 mmol/L (ref 19–32)
CREATININE: 2.03 mg/dL — AB (ref 0.50–1.10)
CREATININE: 2.02 mg/dL — AB (ref 0.50–1.10)
Calcium: 9.8 mg/dL (ref 8.4–10.5)
Calcium: 9.8 mg/dL (ref 8.4–10.5)
Calcium: 9.6 mg/dL (ref 8.4–10.5)
Chloride: >130 mEq/L (ref 96–112)
Chloride: >130 mEq/L (ref 96–112)
Creatinine, Ser: 1.99 mg/dL — ABNORMAL HIGH (ref 0.50–1.10)
GFR calc Af Amer: 27 mL/min — ABNORMAL LOW (ref >90)
GFR calc Af Amer: 27 mL/min — ABNORMAL LOW (ref >90)
GFR calc Af Amer: 27 mL/min — ABNORMAL LOW (ref >90)
GFR calc non Af Amer: 24 mL/min — ABNORMAL LOW (ref >90)
GFR, EST NON AFRICAN AMERICAN: 23 mL/min — AB (ref >90)
GFR, EST NON AFRICAN AMERICAN: 23 mL/min — AB (ref >90)
GLUCOSE: 167 mg/dL — AB (ref 70–99)
GLUCOSE: 177 mg/dL — AB (ref 70–99)
Glucose, Bld: 179 mg/dL — ABNORMAL HIGH (ref 70–99)
POTASSIUM: 3.6 mmol/L (ref 3.5–5.1)
POTASSIUM: 3.7 mmol/L (ref 3.5–5.1)
Potassium: 3.5 mmol/L (ref 3.5–5.1)
SODIUM: 167 mmol/L — AB (ref 135–145)
Sodium: 166 mmol/L (ref 135–145)
Sodium: 165 mmol/L (ref 135–145)

## 2014-06-18 LAB — MAGNESIUM: MAGNESIUM: 2.6 mg/dL — AB (ref 1.5–2.5)

## 2014-06-18 LAB — CK TOTAL AND CKMB (NOT AT ARMC)
CK, MB: 2.4 ng/mL (ref 0.3–4.0)
Relative Index: INVALID (ref 0.0–2.5)
Total CK: 91 U/L (ref 7–177)

## 2014-06-18 LAB — LACTIC ACID, PLASMA: Lactic Acid, Venous: 3.1 mmol/L — ABNORMAL HIGH (ref 0.5–2.2)

## 2014-06-18 LAB — TROPONIN I: Troponin I: 0.09 ng/mL — ABNORMAL HIGH (ref <0.031)

## 2014-06-18 LAB — PHOSPHORUS: PHOSPHORUS: 3.3 mg/dL (ref 2.3–4.6)

## 2014-06-18 MED ORDER — LEVOTHYROXINE SODIUM 100 MCG IV SOLR
75.0000 ug | Freq: Every day | INTRAVENOUS | Status: DC
  Administered 2014-06-19: 75 ug via INTRAVENOUS
  Filled 2014-06-18: qty 5

## 2014-06-18 MED ORDER — PROPOFOL 10 MG/ML IV EMUL
5.0000 ug/kg/min | INTRAVENOUS | Status: DC
Start: 2014-06-18 — End: 2014-06-19
  Filled 2014-06-18: qty 100

## 2014-06-18 MED ORDER — DEXTROSE 5 % IV SOLN
INTRAVENOUS | Status: AC
  Administered 2014-06-18 – 2014-06-20 (×3): via INTRAVENOUS

## 2014-06-18 MED ORDER — SODIUM CHLORIDE 0.9 % IV BOLUS (SEPSIS)
500.0000 mL | Freq: Once | INTRAVENOUS | Status: AC
  Administered 2014-06-18: 500 mL via INTRAVENOUS

## 2014-06-18 MED ORDER — POTASSIUM CHLORIDE 10 MEQ/100ML IV SOLN
10.0000 meq | INTRAVENOUS | Status: AC
  Administered 2014-06-18 – 2014-06-19 (×2): 10 meq via INTRAVENOUS
  Filled 2014-06-18: qty 100

## 2014-06-18 MED ORDER — DEXTROSE 5 % IV SOLN
10.0000 mg/kg | Freq: Every day | INTRAVENOUS | Status: DC
  Filled 2014-06-18: qty 13

## 2014-06-18 MED ORDER — LORAZEPAM 2 MG/ML IJ SOLN
INTRAMUSCULAR | Status: AC
  Filled 2014-06-18: qty 1

## 2014-06-18 MED ORDER — VANCOMYCIN HCL IN DEXTROSE 750-5 MG/150ML-% IV SOLN
750.0000 mg | INTRAVENOUS | Status: AC
  Administered 2014-06-18 – 2014-06-24 (×7): 750 mg via INTRAVENOUS
  Filled 2014-06-18 (×7): qty 150

## 2014-06-18 MED ORDER — DEXTROSE 5 % IV SOLN
1.0000 g | INTRAVENOUS | Status: AC
  Administered 2014-06-18 – 2014-06-24 (×7): 1 g via INTRAVENOUS
  Filled 2014-06-18 (×7): qty 1

## 2014-06-18 MED ORDER — DEXTROSE 5 % IV SOLN
500.0000 mg | INTRAVENOUS | Status: DC
  Filled 2014-06-18: qty 500

## 2014-06-18 NOTE — Progress Notes (Signed)
RN entered room around 2115 to reassess pt's lungs,and  urine output before calling MD because pt sounded like fluid overload and JVD was present.  Lab tech had only been out of room one to two minutes.  Pt was found agonal breathing and while trying to sternal rub her I received message from telemetry that she was in severe brady.  Pt unresponsive, no pulse, no respirations, code called, CPR started, pt intubated and moved to ICU.

## 2014-06-18 NOTE — Progress Notes (Signed)
Echocardiogram 2D Echocardiogram has been performed.  [name] [name], [name] [name] M 06/18/2014, 9:08 AM

## 2014-06-18 NOTE — Anesthesia Procedure Notes (Signed)
Procedure Name: Intubation Date/Time: 06/18/2014 9:33 PM Performed by: Paris LoreBLANTON, Wael Maestas M Pre-anesthesia Checklist: Patient identified, Emergency Drugs available, Suction available, Patient being monitored and Timeout performed Patient Re-evaluated:Patient Re-evaluated prior to inductionOxygen Delivery Method: Circle system utilized Preoxygenation: Pre-oxygenation with 100% oxygen Intubation Type: IV induction Ventilation: Mask ventilation without difficulty Laryngoscope Size: Mac and 4 Grade View: Grade I Tube type: Subglottic suction tube Tube size: 7.5 mm Number of attempts: 1 Airway Equipment and Method: Stylet Placement Confirmation: ETT inserted through vocal cords under direct vision,  positive ETCO2,  CO2 detector and breath sounds checked- equal and bilateral Secured at: 21 cm Tube secured with: Tape Dental Injury: Teeth and Oropharynx as per pre-operative assessment  Comments: Called to unit for Code Blue placed advanced airway without need for sedation/intubation medications.

## 2014-06-18 NOTE — Addendum Note (Signed)
Addendum  created 06/18/14 2152 by Paris LoreShannon M Javaria Knapke, CRNA   Modules edited: Charges VN

## 2014-06-18 NOTE — Progress Notes (Signed)
ANTIBIOTIC CONSULT NOTE - follow up  Pharmacy Consult for cefepime, vancomycin Indication: possible HCAP  Allergies  Allergen Reactions  . Antihistamine Decongestant [Triprolidine-Pse] Diarrhea  . Codeine Other (See Comments)    Gi upset     Patient Measurements: Height: 5\' 6"  (167.6 cm) Weight: 161 lb 6 oz (73.2 kg) IBW/kg (Calculated) : 59.3   Vital Signs: Temp: 99 F (37.2 C) (01/15 0933) Temp Source: Axillary (01/15 0933) BP: 164/97 mmHg (01/15 0500) Pulse Rate: 105 (01/15 0500) Intake/Output from previous day: 01/14 0701 - 01/15 0700 In: 1192.5 [I.V.:942.5; IV Piggyback:250] Out: 775 [Urine:775] Intake/Output from this shift:    Labs:  Recent Labs  06/16/14 1153 06/16/14 1156 06/17/14 0530 06/18/14 0508  WBC 13.7*  --  12.0* 13.4*  HGB 14.0 15.0 13.7 14.8  PLT 297  --  234 240  CREATININE 2.02* 1.80* 1.58* 1.98*   Estimated Creatinine Clearance: 25.5 mL/min (by C-G formula based on Cr of 1.98). No results for input(s): VANCOTROUGH, VANCOPEAK, VANCORANDOM, GENTTROUGH, GENTPEAK, GENTRANDOM, TOBRATROUGH, TOBRAPEAK, TOBRARND, AMIKACINPEAK, AMIKACINTROU, AMIKACIN in the last 72 hours.    Medical History: Past Medical History  Diagnosis Date  . Hypertension   . Bipolar 1 disorder   . Hypothyroidism   . Memory difficulties   . HLD (hyperlipidemia)   . Diverticulitis large intestine   . Pneumonia    Microbiology: 1/13 blood x 2: NGTD 1/13 urine: NGTD  Anti-infectives: 1/13 >>cefepime>> 1/13 >>vancomycin>>  Assessment: 74 y/o F admitted through ED from rehab facility 1/13 with fever and AMS.  CXR showed unchanged mild subsegmental atelectasis or scar RLL.  Orders received to begin cefepime and vancomycin with pharmacy dosing assistance for r/o HCAP.   Azotemia noted.  Per [facility] records, baseline SCr ~ 1.2.  Today: Scr 2.0, CrCl ~5225mls/min, (2228mls/min N)  Goal of Therapy:  Appropriate antibiotic dosing for indication and renal function; eradication  of infection. Vancomycin trough 15-20  Plan:  1. Decrease vancomycin to 750mg  IV q24h 2. Decrease Cefepime to 1gm q24h 3. Follow serum creatinine, cultures, clinical course, readjust dosages as necessary. 4. No source of infection as been found, per ID continue vanc and cefepime for now.  Check vanc trough if therapy is continued.  Arley Phenixllen Dianelys Scinto RPh 06/18/2014, 10:29 AM Pager [phone]

## 2014-06-18 NOTE — Progress Notes (Signed)
CRITICAL VALUE ALERT  Critical value received:  NA+ 168 and Cl- >130  Date of notification:  06/18/2014  Time of notification:  0730  Critical value read back:Yes.    Nurse who received alert:  Gayleen Oremhasity Dacotah Cabello   MD notified (1st page):  Ranga  Time of first page:  0740  MD notified (2nd page):  Time of second page:  Responding MD:  Venetia Constableanga  Time MD responded:  [phone]

## 2014-06-18 NOTE — Consult Note (Addendum)
Name: [name] [name] MRN: [id number] DOB: 1940/08/19    ADMISSION DATE:  06/16/2014 CONSULTATION DATE:  06/16/14  REFERRING MD :  Dr. Venetia Constable  CHIEF COMPLAINT:  AMS / Fever   BRIEF PATIENT DESCRIPTION: 74 y/o F, SNF resident admitted 1/13 with fever and altered mental status.  Initial concerns for potential intubation and PCCM consulted for evaluation. Patient had been managed with ABX on the floor and today had a symptomatic bradycardia, possibly lost her pulse and now s/p CPR + atropine with ROSC, intubated. Still has high Na  SIGNIFICANT EVENTS  1/13  Admit with fever / AMS 1/15: brady, possibly lost pulse, s/p CPR + atropine, intubated  STUDIES:  1/13  CT Head >>  Negative  1/15 CXR: possible LLL pneumonia  Interval History 06/18/14: Patient was being managed for fever, AMS, hypernatremia, AKI on the floor then developed symptomatic bradycardia, lost consciousness, possibly lost pulse, CPR for 3 minutes, got atropine, with ROSC. Intubated. Na level still significantly high. Cr slightly lower. Fluid was already changed to D5W. No report of hypoxia or aspiration. Patient currently is not able to give history. Moved to ICU.   Initial HPI:  74 y/o F, SNF resident with a past medical history of hypertension, hypothyroidism, hyperlipidemia, diverticulitis, bipolar disorder and memory loss who presented to the [facility] on 1/13 from a skilled nursing facility with approximately 24 hours of progressive lethargy and fever. Of note, she was hospitalized in November for pneumonia and acute renal failure.  She has resided at a skilled nursing facility since that time.  ER evaluation noted her to have a rectal temp of 102F, altered mental status with initial concerns for need of mechanical ventilation. Her son reported he last saw her 4 days prior to admission and was doing well at that time. She reportedly has had decreased oral intake/decreased appetite.  The patient was treated with IV  fluids in the emergency room with improvement in mental status. Initial lab work notable for sodium 151, potassium 3.9, chloride 121, serum creatinine 2.02 (baseline ~ 1.1), glucose 121, lactic acid 1.01, hemoglobin 15 and platelets 297.  PCCM consulted to evaluate for AMS and need for intubation.  PAST MEDICAL HISTORY :   has a past medical history of Hypertension; Bipolar 1 disorder; Hypothyroidism; Memory difficulties; HLD (hyperlipidemia); Diverticulitis large intestine; and Pneumonia.  has past surgical history that includes No past surgeries.   HOME MEDICATIONS:  Prior to Admission medications   Medication Sig Start Date End Date Taking? Authorizing Provider  amLODipine (NORVASC) 5 MG tablet Take 5 mg by mouth daily with breakfast.    Yes Historical Provider, MD  antiseptic oral rinse (BIOTENE) LIQD 15 mLs by Mouth Rinse route as needed for dry mouth.   Yes Historical Provider, MD  b complex vitamins capsule Take 1 capsule by mouth daily with breakfast.   Yes Historical Provider, MD  benztropine (COGENTIN) 1 MG tablet Take 1 mg by mouth daily with breakfast.    Yes Historical Provider, MD  carbamazepine (TEGRETOL) 200 MG tablet Take 200 mg by mouth 2 (two) times daily.   Yes Historical Provider, MD  cholecalciferol (VITAMIN D) 1000 UNITS tablet Take 1,000 Units by mouth daily with breakfast.   Yes Historical Provider, MD  guaiFENesin-dextromethorphan (ROBITUSSIN DM) 100-10 MG/5ML syrup Take 5 mLs by mouth every 4 (four) hours as needed for cough.   Yes Historical Provider, MD  lactobacillus acidophilus (BACID) TABS tablet Take 1 tablet by mouth 2 (two) times daily.  Yes Historical Provider, MD  levothyroxine (SYNTHROID, LEVOTHROID) 150 MCG tablet Take 150 mcg by mouth daily before breakfast.   Yes Historical Provider, MD  lithium carbonate 300 MG capsule Take 300-600 mg by mouth 2 (two) times daily. Takes  in the morning and  in the evening   Yes Historical Provider, MD  LORazepam  (ATIVAN) 0.5 MG tablet Take 0.5 mg by mouth every 6 (six) hours as needed for anxiety (and agitation).   Yes Historical Provider, MD  Maltodextrin-Xanthan Gum (RESOURCE THICKENUP CLEAR) POWD Use as indicated. 05/03/14  Yes Kathlen Mody, MD  phenol (CHLORASEPTIC) 1.4 % LIQD Use as directed 2 sprays in the mouth or throat every 2 (two) hours as needed for throat irritation / pain.   Yes Historical Provider, MD  senna (SENOKOT) 8.6 MG TABS tablet Take 1 tablet (8.6 mg total) by mouth 2 (two) times daily. 05/03/14  Yes Kathlen Mody, MD  simvastatin (ZOCOR) 20 MG tablet Take 20 mg by mouth at bedtime.    Yes Historical Provider, MD  temazepam (RESTORIL) 7.5 MG capsule Take 7.5 mg by mouth at bedtime.   Yes Historical Provider, MD  traMADol (ULTRAM) 50 MG tablet Take 25 mg by mouth every 6 (six) hours as needed (for pain).   Yes Historical Provider, MD  amoxicillin-clavulanate (AUGMENTIN) 875-125 MG per tablet Take 1 tablet by mouth 2 (two) times daily. Patient not taking: Reported on 06/16/2014 05/03/14   Kathlen Mody, MD  Water For Irrigation, Sterile (FREE WATER) SOLN Take 200 mLs by mouth every 8 (eight) hours. Patient not taking: Reported on 06/16/2014 05/03/14   Kathlen Mody, MD   Allergies  Allergen Reactions  . Antihistamine Decongestant [Triprolidine-Pse] Diarrhea  . Codeine Other (See Comments)    Gi upset     FAMILY HISTORY:  family history includes Heart disease in her father.   SOCIAL HISTORY:  reports that she has never smoked. She has never used smokeless tobacco. She reports that she does not drink alcohol or use illicit drugs.  REVIEW OF SYSTEMS:  Unable to complete as patient is altered.   SUBJECTIVE:   VITAL SIGNS: Temp:  [99 F (37.2 C)-101.3 F (38.5 C)] 99.3 F (37.4 C) (01/15 2158) Pulse Rate:  [104-110] 110 (01/15 2158) Resp:  [15-32] 15 (01/15 2158) BP: (102-164)/(70-97) 102/70 mmHg (01/15 2158) SpO2:  [85 %-95 %] 85 % (01/15 2158) FiO2 (%):  [100 %] 100 %  (01/15 2158) Weight:  [73.5 kg (162 lb 0.6 oz)] 73.5 kg (162 lb 0.6 oz) (01/15 2158)  PHYSICAL EXAMINATION: General:  Intubated Neuro: sedated, has gag reflex, grimaces to pain HEENT:  ET tube in place, no trauma Cardiovascular:  s1s2 rrr, no m/r/g Lungs:  lungs bilaterally diminished but clear  Abdomen:  NTND Musculoskeletal:  No acute deformities  Skin:  Warm/dry, no edema    Recent Labs Lab 06/18/14 0942 06/18/14 1357 06/18/14 1658  NA 167* 166* 165*  K 3.6 3.7 3.5  CL >130* >130* >130*  CO2 BUN 31* 30* 30*  CREATININE 2.03* 2.02* 1.99*  GLUCOSE 167* 177* 179*    Recent Labs Lab 06/17/14 0530 06/18/14 0508 06/18/14 2211  HGB 13.7 14.8 14.1  HCT 46.8* 51.2* 49.8*  WBC 12.0* 13.4* 12.6*  PLT 234 240 185   Ct Abdomen Pelvis Wo Contrast  06/17/2014   CLINICAL DATA:  74 year old female with fever, sepsis and decreased level of consciousness.  EXAM: CT CHEST, ABDOMEN AND PELVIS WITHOUT CONTRAST  TECHNIQUE: Multidetector CT  imaging of the chest, abdomen and pelvis was performed following the standard protocol without IV contrast.  COMPARISON:  No priors.  FINDINGS: CT CHEST FINDINGS  Mediastinum/Lymph Nodes: Heart size is normal. There is no significant pericardial fluid, thickening or pericardial calcification. Atherosclerosis of the thoracic aorta and great vessels of the mediastinum, including a ectasia of the isthmus of the thoracic aorta which measures up to 3.8 cm in diameter. No pathologically enlarged mediastinal, hilar or axillary lymph nodes. Esophagus is unremarkable in appearance.  Lungs/Pleura: In the basal portions of the lower lobes of the lungs bilaterally (left greater than right), there is some thickening of the peribronchovascular interstitium and focal architectural distortion with volume loss, favored to reflect chronic scarring. No consolidative airspace disease. No pleural effusions. No suspicious appearing pulmonary nodules or masses.   Musculoskeletal/Soft Tissues: There are no aggressive appearing lytic or blastic lesions noted in the visualized portions of the skeleton.  CT ABDOMEN AND PELVIS FINDINGS  Hepatobiliary: The gallbladder is not visualized and is likely surgically absent (no surgical clips are noted in the region of the gallbladder fossa). Common bile duct appears mildly dilated measuring 10 mm in the porta hepatis. There also appears to be mild intrahepatic biliary ductal dilatation, although assessment is limited on today's non contrast CT examination. No discrete hepatic lesions are noted on today's non contrast CT examination.  Pancreas: Unremarkable.  Spleen: Unremarkable.  Adrenals/Urinary Tract: There is some stranding around the right adrenal gland. No discrete adrenal mass or nodule identified. Left adrenal gland is unremarkable in appearance. Small amount of perinephric stranding adjacent to the kidneys bilaterally (nonspecific). The unenhanced appearance of the kidneys is otherwise unremarkable. No hydroureteronephrosis. Foley balloon catheter with tip in the lumen of the urinary bladder. Small amount of nondependent gas in the lumen of the urinary bladder, iatrogenic.  Stomach/Bowel: The unenhanced appearance of the stomach is normal. No pathologic dilatation of small bowel or colon. Numerous colonic diverticulae are noted, particularly in the sigmoid colon, without surrounding inflammatory changes to suggest an acute diverticulitis at this time.  Vascular/Lymphatic: Mild atherosclerotic calcifications throughout the abdominal and pelvic vasculature, without definite aneurysm. No lymphadenopathy identified in the abdomen or pelvis on today's non contrast CT examination.  Reproductive: Uterus and ovaries are atrophic.  Other: No significant volume of ascites.  No pneumoperitoneum.  Musculoskeletal: There are no aggressive appearing lytic or blastic lesions noted in the visualized portions of the skeleton.  IMPRESSION: 1. No  definite source to explain the patient's sepsis identified on today's examination. 2. There is mild intra and extrahepatic biliary ductal dilatation, which likely reflects post cholecystectomy physiology in this patient. Correlation with liver function tests is recommended, however. 3. Thickening of the peribronchovascular interstitium throughout the basal segments of the lower lobes of the lungs bilaterally (left greater than right), most likely to reflect areas of chronic post infectious or inflammatory scarring. 4. Atherosclerosis, including ectasia of the isthmus of the thoracic aorta which measures up to 3.8 cm in diameter. 5. Colonic diverticulosis without findings to suggest acute diverticulitis at this time. 6. Additional incidental findings, as above.   Electronically Signed   By: Trudie Reed M.D.   On: 06/17/2014 16:44   Ct Chest Wo Contrast  06/17/2014   CLINICAL DATA:  74 year old female with fever, sepsis and decreased level of consciousness.  EXAM: CT CHEST, ABDOMEN AND PELVIS WITHOUT CONTRAST  TECHNIQUE: Multidetector CT imaging of the chest, abdomen and pelvis was performed following the standard protocol without IV contrast.  COMPARISON:  No priors.  FINDINGS: CT CHEST FINDINGS  Mediastinum/Lymph Nodes: Heart size is normal. There is no significant pericardial fluid, thickening or pericardial calcification. Atherosclerosis of the thoracic aorta and great vessels of the mediastinum, including a ectasia of the isthmus of the thoracic aorta which measures up to 3.8 cm in diameter. No pathologically enlarged mediastinal, hilar or axillary lymph nodes. Esophagus is unremarkable in appearance.  Lungs/Pleura: In the basal portions of the lower lobes of the lungs bilaterally (left greater than right), there is some thickening of the peribronchovascular interstitium and focal architectural distortion with volume loss, favored to reflect chronic scarring. No consolidative airspace disease. No pleural  effusions. No suspicious appearing pulmonary nodules or masses.  Musculoskeletal/Soft Tissues: There are no aggressive appearing lytic or blastic lesions noted in the visualized portions of the skeleton.  CT ABDOMEN AND PELVIS FINDINGS  Hepatobiliary: The gallbladder is not visualized and is likely surgically absent (no surgical clips are noted in the region of the gallbladder fossa). Common bile duct appears mildly dilated measuring 10 mm in the porta hepatis. There also appears to be mild intrahepatic biliary ductal dilatation, although assessment is limited on today's non contrast CT examination. No discrete hepatic lesions are noted on today's non contrast CT examination.  Pancreas: Unremarkable.  Spleen: Unremarkable.  Adrenals/Urinary Tract: There is some stranding around the right adrenal gland. No discrete adrenal mass or nodule identified. Left adrenal gland is unremarkable in appearance. Small amount of perinephric stranding adjacent to the kidneys bilaterally (nonspecific). The unenhanced appearance of the kidneys is otherwise unremarkable. No hydroureteronephrosis. Foley balloon catheter with tip in the lumen of the urinary bladder. Small amount of nondependent gas in the lumen of the urinary bladder, iatrogenic.  Stomach/Bowel: The unenhanced appearance of the stomach is normal. No pathologic dilatation of small bowel or colon. Numerous colonic diverticulae are noted, particularly in the sigmoid colon, without surrounding inflammatory changes to suggest an acute diverticulitis at this time.  Vascular/Lymphatic: Mild atherosclerotic calcifications throughout the abdominal and pelvic vasculature, without definite aneurysm. No lymphadenopathy identified in the abdomen or pelvis on today's non contrast CT examination.  Reproductive: Uterus and ovaries are atrophic.  Other: No significant volume of ascites.  No pneumoperitoneum.  Musculoskeletal: There are no aggressive appearing lytic or blastic lesions  noted in the visualized portions of the skeleton.  IMPRESSION: 1. No definite source to explain the patient's sepsis identified on today's examination. 2. There is mild intra and extrahepatic biliary ductal dilatation, which likely reflects post cholecystectomy physiology in this patient. Correlation with liver function tests is recommended, however. 3. Thickening of the peribronchovascular interstitium throughout the basal segments of the lower lobes of the lungs bilaterally (left greater than right), most likely to reflect areas of chronic post infectious or inflammatory scarring. 4. Atherosclerosis, including ectasia of the isthmus of the thoracic aorta which measures up to 3.8 cm in diameter. 5. Colonic diverticulosis without findings to suggest acute diverticulitis at this time. 6. Additional incidental findings, as above.   Electronically Signed   By: Trudie Reedaniel  Entrikin M.D.   On: 06/17/2014 16:44   Mr Brain Wo Contrast  06/18/2014   CLINICAL DATA:  Possible CVA. Encephalopathy. Unresponsive. Fever. Acute mental status decline over the past 3 days.  EXAM: MRI HEAD WITHOUT CONTRAST  TECHNIQUE: Multiplanar, multiecho pulse sequences of the brain and surrounding structures were obtained without intravenous contrast.  COMPARISON:  CT head 06/16/2014.  FINDINGS: No evidence for acute infarction, hemorrhage, mass lesion, hydrocephalus, or extra-axial fluid.  Generalized atrophy. Mild subcortical and periventricular T2 and FLAIR hyperintensities, likely chronic microvascular ischemic change. Flow voids are maintained throughout the carotid, basilar, and vertebral arteries. There are no areas of chronic hemorrhage. Pituitary, pineal, and cerebellar tonsils unremarkable. No upper cervical lesions. Visualized calvarium, skull base, and upper cervical osseous structures unremarkable. Scalp and extracranial soft tissues, orbits, sinuses, and mastoids show no acute process.  IMPRESSION: Mild atrophy and small vessel  disease. No acute intracranial abnormality.   Electronically Signed   By: Davonna Belling M.D.   On: 06/18/2014 12:14   Dg Chest Port 1 View  06/18/2014   CLINICAL DATA:  Endotracheal tube placement. Recent CPR, with respiratory distress. Initial encounter.  EXAM: PORTABLE CHEST - 1 VIEW  COMPARISON:  CT of the chest performed 06/17/2014, and chest radiograph performed 06/16/2014  FINDINGS: The patient's endotracheal tube is seen ending 3 cm above the carina.  There is mild elevation of the left hemidiaphragm. Left basilar airspace opacification may reflect atelectasis or possibly pneumonia. Given recent CPR, aspiration cannot be excluded. The right lung appears clear. No definite pleural effusion or pneumothorax is seen.  The cardiomediastinal silhouette remains borderline normal in size. No acute osseous abnormalities are seen.  IMPRESSION: 1. Endotracheal tube seen ending 3 cm above the carina. 2. Mild elevation of the left hemidiaphragm. Left basilar airspace opacification may reflect atelectasis or possibly pneumonia. Given the recent history, aspiration cannot be excluded.   Electronically Signed   By: Roanna Raider M.D.   On: 06/18/2014 22:16    ASSESSMENT / PLAN:  S/p code blue for symptomatic bradycardia, with ROSC, intubated Unclear reason why she has sudden severe bradycardia, possibly related to severe electrolyte imbalance? No report of hypoxia or aspiration  Plan: Mechanical vent support and wean as tolerated, VAP bundle, follow up ABG If mental status not better in 24-48 hours, consider CT head or MRI DVT and GI prophylaxis  Fever - temp up to 102 with AMS, possible LLL PNA SIRS  LP negative  Plan: Continue Abx, follow culture If fever continues to be high and not responding to abx then will consider other etiologies such as autoimmune etiologies, such as giant cell arteritis (in an elderly) PRN tylenol  Baseline Dementia (?) - documented memory difficulties but uncertain to  what degree Bipolar Disorder - on lithium and carbamazepine, may be contributing to change in MS & sodium disturbance  Plan: Continue to hold the meds. Avoid meds that can worsen mental status   Acute Kidney Injury - in setting of volume depletion and fever Severe Hypernatremia Mild hypokalemia  Plan: Trend BMP Q4, continue D5W for now, monitor blood sugar target 140-180 If Na level is resistant to water replacement and if patient has a lot of dilute urine output, will consider other possible etiologies such as diabetes insipidus Give K replacement Check Mag level in AM  Hypothyroidism  Plan: Restart IV levothyroxine  Critical care time 35 minutes  Wadie Lessen MD [facility]    06/18/2014, 10:30 PM

## 2014-06-18 NOTE — Progress Notes (Signed)
CRITICAL VALUE ALERT  Critical value received:  Na+ 165 and Cl->130  Date of notification:  06/18/2014  Time of notification:  1546   Critical value read back:Yes.    Nurse who received alert:  Viviana SimplerNakiyha Kyian Obst  MD notified (1st page):  Ranga  Time of first page:  1551  Responding MD:    Time MD responded:

## 2014-06-18 NOTE — Progress Notes (Signed)
[facility] PROGRESS NOTE  [name] [name] ZOX:[id number] DOB: [birth year] DOA: 06/16/2014 PCP: Astrid Divine, MD  Summary/Subjective Appreciate ID/Nephrology. [name] [name] is a 74 y.o. female who lives in a skilled nursing facility and has a history of hypertension, bipolar disorder, hypothyroidism, who was the in this hospital back in November and was treated for pneumonia and was admitted this time around on 06/16/13 from a SNF with complaints of decreased level of consciousness associated with fever of 102F in ED with concern for Acute Encephalopathy which could be due to metabolic or toxic reason. She was also dehydrated with hypernatremia(Na 151) and acute kidney injury(bun/cr-25/2.02. She  had leucocytosis of [address]. CXR/CT brain were unrevealing. Lumbar puncture was unremarkable. She was therefore placed on broad spectrum antibiotics and IVF. Septic work up(including BC/UC/CSF culture) is unrevealing to date. Of note is that patient was on Lithium(level normal) for Bipolar disorder. Her troponin was slightly elevated with some nonspecific EKG changes. The elevated troponin/liver enzymes likely related to sepsis. However, the source of sepsis remains unclear. CT abdomen and pelvis did not show potential source of infection. MRI brain done today was unrevealing. She continues to have fever, leukocytosis, hypernatremia with sodium increased to 168 and acute kidney injury. She has been graciously seen by Dr. Orvan Falconer. Appreciate help. I also discussed with Dr. Arlean Hopping who will see patient in consult in a.m. He has recommended increasing D5W which has been done. Will also check BMP every 4 hours. We'll continue antibiotics with ID direction. 2-D echo showed EF 70% with normal wall motion abnormalities or obvious vegetations. There is no clear indication for further cardiac workup at this point. Patient remains nonverbal. She is able to protect her airway. Plan Acute  encephalopathy/Fever/Sepsis/transaminitis  Follow septic work up.  Reviewed CT chest/abdomen/pelvis  Continue Vanc/Cefepime per pharmacy, day 3/7-10. Follow ID recommendations. Acute kidney injury/Hypernatremia/Dehydration  Continue foley  Change IVF to D5W  Follow nephrology recommendations  Check CPK for rhabdomyolysis Elevated troponin/Essential hypertension  ASA  Reviewed Echo results  Hydralazine prn Bipolar 1 disorder  Antipsychotics on hold  Sort out meds once patient more with it. Hypothyroid  Tsh normal  Resume Synthroid once patient can take oral. DVT/GI Prophylaxis  Heparin SQ  Protonix iv while NPO. Code Status: Full Code. Family Communication: Called son [name] [name] at [phone] on 06/17/2013 and updated him on patient's condition. He mentions that patient was on ?haldol injections every other week till recently Disposition Plan: Continue telemetry.   Consultants:  PCCM  ID  Nephrology  Procedures:  LP  Antibiotics:  Vancomycin 06/16/13>  Cefepime 06/16/13> Objective: Filed Vitals:   06/18/14 0933  BP:   Pulse:   Temp: 99 F (37.2 C)  Resp:     Intake/Output Summary (Last 24 hours) at 06/18/14 1407 Last data filed at 06/17/14 1700  Gross per 24 hour  Intake    300 ml  Output    775 ml  Net   -475 ml   Filed Weights   06/16/14 1142 06/16/14 1700  Weight: 81.647 kg (180 lb) 73.2 kg (161 lb 6 oz)    Exam:   General:  Nonverbal.  Cardiovascular: S1-S2 normal. No murmurs. RRR.  Respiratory: Good air entry bilaterally. No rhonchi or rales.  Abdomen: Soft, nontender, positive bowel sounds.  Musculoskeletal: No pedal edema.   Data Reviewed: Basic Metabolic Panel:  Recent Labs Lab 06/16/14 1153 06/16/14 1156 06/17/14 0530 06/18/14 0508 06/18/14 0942  NA 151* 155* 163* 168* 167*  K 3.9 4.1 3.7 3.8  3.6  CL 121* 122* 129* >130* >130*  CO2 24  --  25 25 25   GLUCOSE 112* 115* 106* 162* 167*  BUN 25* 31*  29* 30* 31*  CREATININE 2.02* 1.80* 1.58* 1.98* 2.03*  CALCIUM 9.6  --  10.0 10.1 9.8  MG  --   --   --  2.6*  --   PHOS  --   --   --  3.3  --    Liver Function Tests:  Recent Labs Lab 06/16/14 1153 06/17/14 0530 06/18/14 0508  AST 64* 53* 35  ALT 45* 45* 37*  ALKPHOS 96 89 88  BILITOT 0.7 0.8 0.5  PROT 6.8 6.6 6.6  ALBUMIN 3.7 3.6 3.5   No results for input(s): LIPASE, AMYLASE in the last 168 hours. No results for input(s): AMMONIA in the last 168 hours. CBC:  Recent Labs Lab 06/16/14 1153 06/16/14 1156 06/17/14 0530 06/18/14 0508  WBC 13.7*  --  12.0* 13.4*  NEUTROABS 11.9*  --   --   --   HGB 14.0 15.0 13.7 14.8  HCT 47.0* 44.0 46.8* 51.2*  MCV 102.0*  --  103.8* 106.4*  PLT 297  --  234 240   Cardiac Enzymes:  Recent Labs Lab 06/16/14 1153 06/16/14 2154 06/17/14 0530  TROPONINI 0.09* 0.08* 0.08*   BNP (last 3 results) No results for input(s): PROBNP in the last 8760 hours. CBG: No results for input(s): GLUCAP in the last 168 hours.  Recent Results (from the past 240 hour(s))  Urine culture     Status: None   Collection Time: 06/16/14 11:38 AM  Result Value Ref Range Status   Specimen Description URINE, CATHETERIZED  Final   Special Requests NONE  Final   Colony Count NO GROWTH Performed at [facility]   Final   Culture NO GROWTH Performed at [facility]   Final   Report Status 06/17/2014 FINAL  Final  Culture, blood (routine x 2)     Status: None (Preliminary result)   Collection Time: 06/16/14 11:53 AM  Result Value Ref Range Status   Specimen Description BLOOD RIGHT HAND  Final   Special Requests BOTTLES DRAWN AEROBIC AND ANAEROBIC 5CC  Final   Culture   Final           BLOOD CULTURE RECEIVED NO GROWTH TO DATE CULTURE WILL BE HELD FOR 5 DAYS BEFORE ISSUING A FINAL NEGATIVE REPORT Performed at [facility]    Report Status PENDING  Incomplete  Culture, blood (routine x 2)     Status: None (Preliminary result)    Collection Time: 06/16/14 11:53 AM  Result Value Ref Range Status   Specimen Description BLOOD LEFT HAND  Final   Special Requests BOTTLES DRAWN AEROBIC AND ANAEROBIC 4CC  Final   Culture   Final           BLOOD CULTURE RECEIVED NO GROWTH TO DATE CULTURE WILL BE HELD FOR 5 DAYS BEFORE ISSUING A FINAL NEGATIVE REPORT Performed at [facility]    Report Status PENDING  Incomplete  Gram stain     Status: None   Collection Time: 06/16/14  5:07 PM  Result Value Ref Range Status   Specimen Description CSF  Final   Special Requests NONE  Final   Gram Stain   Final    NO ORGANISMS SEEN NO WBC SEEN CYTOSPIN Gram Stain Report Called to,Read Back By and Verified With: ALDRIDGE,D. RN @1859  ON 1.13.16 BY MCCOY,N.  Report Status 06/16/2014 FINAL  Final  CSF culture     Status: None (Preliminary result)   Collection Time: 06/16/14  5:07 PM  Result Value Ref Range Status   Specimen Description CSF  Final   Special Requests NONE  Final   Gram Stain   Final    NO WBC SEEN NO ORGANISMS SEEN [facility]    Culture   Final    NO GROWTH 1 DAY Performed at [facility]    Report Status PENDING  Incomplete  MRSA PCR Screening     Status: Abnormal   Collection Time: 06/16/14  5:19 PM  Result Value Ref Range Status   MRSA by PCR POSITIVE (A) NEGATIVE Final    Comment:        The GeneXpert MRSA Assay (FDA approved for NASAL specimens only), is one component of a comprehensive MRSA colonization surveillance program. It is not intended to diagnose MRSA infection nor to guide or monitor treatment for MRSA infections. RESULT CALLED TO, READ BACK BY AND VERIFIED WITH: C.DENNY,RN AT 2357 ON 06/16/14 BY SHEA.W      Studies: Ct Abdomen Pelvis Wo Contrast  06/17/2014   CLINICAL DATA:  74 year old female with fever, sepsis and decreased level of consciousness.  EXAM: CT CHEST, ABDOMEN AND PELVIS WITHOUT CONTRAST  TECHNIQUE: Multidetector CT imaging of the  chest, abdomen and pelvis was performed following the standard protocol without IV contrast.  COMPARISON:  No priors.  FINDINGS: CT CHEST FINDINGS  Mediastinum/Lymph Nodes: Heart size is normal. There is no significant pericardial fluid, thickening or pericardial calcification. Atherosclerosis of the thoracic aorta and great vessels of the mediastinum, including a ectasia of the isthmus of the thoracic aorta which measures up to 3.8 cm in diameter. No pathologically enlarged mediastinal, hilar or axillary lymph nodes. Esophagus is unremarkable in appearance.  Lungs/Pleura: In the basal portions of the lower lobes of the lungs bilaterally (left greater than right), there is some thickening of the peribronchovascular interstitium and focal architectural distortion with volume loss, favored to reflect chronic scarring. No consolidative airspace disease. No pleural effusions. No suspicious appearing pulmonary nodules or masses.  Musculoskeletal/Soft Tissues: There are no aggressive appearing lytic or blastic lesions noted in the visualized portions of the skeleton.  CT ABDOMEN AND PELVIS FINDINGS  Hepatobiliary: The gallbladder is not visualized and is likely surgically absent (no surgical clips are noted in the region of the gallbladder fossa). Common bile duct appears mildly dilated measuring 10 mm in the porta hepatis. There also appears to be mild intrahepatic biliary ductal dilatation, although assessment is limited on today's non contrast CT examination. No discrete hepatic lesions are noted on today's non contrast CT examination.  Pancreas: Unremarkable.  Spleen: Unremarkable.  Adrenals/Urinary Tract: There is some stranding around the right adrenal gland. No discrete adrenal mass or nodule identified. Left adrenal gland is unremarkable in appearance. Small amount of perinephric stranding adjacent to the kidneys bilaterally (nonspecific). The unenhanced appearance of the kidneys is otherwise unremarkable. No  hydroureteronephrosis. Foley balloon catheter with tip in the lumen of the urinary bladder. Small amount of nondependent gas in the lumen of the urinary bladder, iatrogenic.  Stomach/Bowel: The unenhanced appearance of the stomach is normal. No pathologic dilatation of small bowel or colon. Numerous colonic diverticulae are noted, particularly in the sigmoid colon, without surrounding inflammatory changes to suggest an acute diverticulitis at this time.  Vascular/Lymphatic: Mild atherosclerotic calcifications throughout the abdominal and pelvic vasculature, without definite aneurysm. No  lymphadenopathy identified in the abdomen or pelvis on today's non contrast CT examination.  Reproductive: Uterus and ovaries are atrophic.  Other: No significant volume of ascites.  No pneumoperitoneum.  Musculoskeletal: There are no aggressive appearing lytic or blastic lesions noted in the visualized portions of the skeleton.  IMPRESSION: 1. No definite source to explain the patient's sepsis identified on today's examination. 2. There is mild intra and extrahepatic biliary ductal dilatation, which likely reflects post cholecystectomy physiology in this patient. Correlation with liver function tests is recommended, however. 3. Thickening of the peribronchovascular interstitium throughout the basal segments of the lower lobes of the lungs bilaterally (left greater than right), most likely to reflect areas of chronic post infectious or inflammatory scarring. 4. Atherosclerosis, including ectasia of the isthmus of the thoracic aorta which measures up to 3.8 cm in diameter. 5. Colonic diverticulosis without findings to suggest acute diverticulitis at this time. 6. Additional incidental findings, as above.   Electronically Signed   By: Trudie Reed M.D.   On: 06/17/2014 16:44   Ct Chest Wo Contrast  06/17/2014   CLINICAL DATA:  74 year old female with fever, sepsis and decreased level of consciousness.  EXAM: CT CHEST, ABDOMEN  AND PELVIS WITHOUT CONTRAST  TECHNIQUE: Multidetector CT imaging of the chest, abdomen and pelvis was performed following the standard protocol without IV contrast.  COMPARISON:  No priors.  FINDINGS: CT CHEST FINDINGS  Mediastinum/Lymph Nodes: Heart size is normal. There is no significant pericardial fluid, thickening or pericardial calcification. Atherosclerosis of the thoracic aorta and great vessels of the mediastinum, including a ectasia of the isthmus of the thoracic aorta which measures up to 3.8 cm in diameter. No pathologically enlarged mediastinal, hilar or axillary lymph nodes. Esophagus is unremarkable in appearance.  Lungs/Pleura: In the basal portions of the lower lobes of the lungs bilaterally (left greater than right), there is some thickening of the peribronchovascular interstitium and focal architectural distortion with volume loss, favored to reflect chronic scarring. No consolidative airspace disease. No pleural effusions. No suspicious appearing pulmonary nodules or masses.  Musculoskeletal/Soft Tissues: There are no aggressive appearing lytic or blastic lesions noted in the visualized portions of the skeleton.  CT ABDOMEN AND PELVIS FINDINGS  Hepatobiliary: The gallbladder is not visualized and is likely surgically absent (no surgical clips are noted in the region of the gallbladder fossa). Common bile duct appears mildly dilated measuring 10 mm in the porta hepatis. There also appears to be mild intrahepatic biliary ductal dilatation, although assessment is limited on today's non contrast CT examination. No discrete hepatic lesions are noted on today's non contrast CT examination.  Pancreas: Unremarkable.  Spleen: Unremarkable.  Adrenals/Urinary Tract: There is some stranding around the right adrenal gland. No discrete adrenal mass or nodule identified. Left adrenal gland is unremarkable in appearance. Small amount of perinephric stranding adjacent to the kidneys bilaterally (nonspecific). The  unenhanced appearance of the kidneys is otherwise unremarkable. No hydroureteronephrosis. Foley balloon catheter with tip in the lumen of the urinary bladder. Small amount of nondependent gas in the lumen of the urinary bladder, iatrogenic.  Stomach/Bowel: The unenhanced appearance of the stomach is normal. No pathologic dilatation of small bowel or colon. Numerous colonic diverticulae are noted, particularly in the sigmoid colon, without surrounding inflammatory changes to suggest an acute diverticulitis at this time.  Vascular/Lymphatic: Mild atherosclerotic calcifications throughout the abdominal and pelvic vasculature, without definite aneurysm. No lymphadenopathy identified in the abdomen or pelvis on today's non contrast CT examination.  Reproductive: Uterus and  ovaries are atrophic.  Other: No significant volume of ascites.  No pneumoperitoneum.  Musculoskeletal: There are no aggressive appearing lytic or blastic lesions noted in the visualized portions of the skeleton.  IMPRESSION: 1. No definite source to explain the patient's sepsis identified on today's examination. 2. There is mild intra and extrahepatic biliary ductal dilatation, which likely reflects post cholecystectomy physiology in this patient. Correlation with liver function tests is recommended, however. 3. Thickening of the peribronchovascular interstitium throughout the basal segments of the lower lobes of the lungs bilaterally (left greater than right), most likely to reflect areas of chronic post infectious or inflammatory scarring. 4. Atherosclerosis, including ectasia of the isthmus of the thoracic aorta which measures up to 3.8 cm in diameter. 5. Colonic diverticulosis without findings to suggest acute diverticulitis at this time. 6. Additional incidental findings, as above.   Electronically Signed   By: Trudie Reed M.D.   On: 06/17/2014 16:44   Mr Brain Wo Contrast  06/18/2014   CLINICAL DATA:  Possible CVA. Encephalopathy.  Unresponsive. Fever. Acute mental status decline over the past 3 days.  EXAM: MRI HEAD WITHOUT CONTRAST  TECHNIQUE: Multiplanar, multiecho pulse sequences of the brain and surrounding structures were obtained without intravenous contrast.  COMPARISON:  CT head 06/16/2014.  FINDINGS: No evidence for acute infarction, hemorrhage, mass lesion, hydrocephalus, or extra-axial fluid. Generalized atrophy. Mild subcortical and periventricular T2 and FLAIR hyperintensities, likely chronic microvascular ischemic change. Flow voids are maintained throughout the carotid, basilar, and vertebral arteries. There are no areas of chronic hemorrhage. Pituitary, pineal, and cerebellar tonsils unremarkable. No upper cervical lesions. Visualized calvarium, skull base, and upper cervical osseous structures unremarkable. Scalp and extracranial soft tissues, orbits, sinuses, and mastoids show no acute process.  IMPRESSION: Mild atrophy and small vessel disease. No acute intracranial abnormality.   Electronically Signed   By: Davonna Belling M.D.   On: 06/18/2014 12:14   Dg Lumbar Puncture Fluoro Guide  06/16/2014   CLINICAL DATA:  Mental status changes.  Unresponsive.  Fever.  EXAM: DIAGNOSTIC LUMBAR PUNCTURE UNDER FLUOROSCOPIC GUIDANCE  FLUOROSCOPY TIME:  1 min and 47 seconds  PROCEDURE: Informed consent was obtained from the the patient's son (secondary to patient's mental status changes) prior to the procedure, including potential complications of headache, allergy, and pain. With the patient prone, the lower back was prepped with Betadine. 1% Lidocaine was used for local anesthesia. Lumbar puncture was performed at the L2-3 level using a 20 gauge needle with return of clear CSF with an opening pressure of 27 cm water. 9-24ml of CSF were obtained for laboratory studies. The patient tolerated the procedure well and there were no apparent complications.  IMPRESSION: Uncomplicated lumbar puncture, as detailed above.  Opening pressure of  approximately 27 cm of CSF.   Electronically Signed   By: Jeronimo Greaves M.D.   On: 06/16/2014 17:20    Scheduled Meds: . antiseptic oral rinse  7 mL Mouth Rinse q12n4p  . aspirin  81 mg Oral Daily  . ceFEPime (MAXIPIME) IV  1 g Intravenous Q24H  . chlorhexidine  15 mL Mouth Rinse BID  . Chlorhexidine Gluconate Cloth  6 each Topical Q0600  . heparin  5,000 Units Subcutaneous 3 times per day  . mupirocin ointment  1 application Nasal BID  . pantoprazole (PROTONIX) IV  40 mg Intravenous Q24H  . sodium chloride  3 mL Intravenous Q12H  . thiamine  100 mg Intravenous Daily  . vancomycin  750 mg Intravenous Q24H  Continuous Infusions: . dextrose 100 mL/hr at 06/18/14 0759    Principal Problem:   Fever Active Problems:   Sepsis   Transaminitis   Acute kidney injury   Hypernatremia   Bipolar 1 disorder   Essential hypertension   Hypothyroid   Acute encephalopathy   Dehydration   Elevated troponin   Macrocytosis without anemia   Atherosclerotic peripheral vascular disease    Time spent: 40 minutes    Graceland Wachter  [facility] Pager [phone]. If 7PM-7AM, please contact night-coverage at [email or web address], password [facility] 06/18/2014, 2:07 PM  LOS: 2 days

## 2014-06-18 NOTE — Progress Notes (Signed)
Patient ID: [name] [name], female   DOB: 1941-06-04, 74 y.o.   MRN: [id number]         [facility]    Date of Admission:  06/16/2014   Day 3 vancomycin        Day 3 cefepime        Day 1 acyclovir        Day 1 azithromycin       Reason for Consult: Fever and encephalopathy    Referring Physician: Dr. Conley Canal  Principal Problem:   Fever Active Problems:   Acute kidney injury   Acute encephalopathy   Hypernatremia   Dehydration   Sepsis   Elevated troponin   Transaminitis   Bipolar 1 disorder   Essential hypertension   Hypothyroid   Macrocytosis without anemia   Atherosclerotic peripheral vascular disease   . acyclovir  10 mg/kg (Adjusted) Intravenous Daily  . antiseptic oral rinse  7 mL Mouth Rinse q12n4p  . aspirin  81 mg Oral Daily  . azithromycin  500 mg Intravenous Q24H  . ceFEPime (MAXIPIME) IV  1 g Intravenous Q24H  . chlorhexidine  15 mL Mouth Rinse BID  . Chlorhexidine Gluconate Cloth  6 each Topical Q0600  . heparin  5,000 Units Subcutaneous 3 times per day  . mupirocin ointment  1 application Nasal BID  . pantoprazole (PROTONIX) IV  40 mg Intravenous Q24H  . sodium chloride  3 mL Intravenous Q12H  . thiamine  100 mg Intravenous Daily  . vancomycin  750 mg Intravenous Q24H    Recommendations: 1. Continue vancomycin and cefepime for now  2. Discontinue acyclovir and azithromycin 3. Hydration and supportive care 4. Observe off of haloperidol and lithium for now  Assessment: The source of her fever is unclear. I tried reaching her son, Randal Buba, by phone today but got no answer. It would be helpful to know if there were any new abnormalities noted in her condition prior to her being found unresponsive. I will continue vancomycin and cefepime for now but do not feel that she needs acyclovir or azithromycin. I wonder if she might have some combination of lithium toxicity (can occur with normal levels) and neuroleptic  malignant syndrome given the psychotropic medication she has been taking. I would keep her off of haloperidol and lithium and continue providing supportive care for now. We will follow with you.    HPI: [name] [name] is a 74 y.o. female who resides in a skilled nursing facility. She was found unresponsive and febrile leading to admission 3 days ago. Her admission note states that she was on amoxicillin clavulanate at the time of admission but does not list the indication. She has a history of some memory loss and bipolar disorder. It appears that she has been taking haloperidol, benztropine, carbamazepine and lithium chronically. She is hypernatremic. Her admission urinalysis was unremarkable. Admission blood cultures are negative to date. No source of infection has been found on chest x-ray, CT scan of the abdomen and pelvis CT scan of the head were by lumbar puncture.   Review of Systems: Review of systems not obtained due to patient factors.  Past Medical History  Diagnosis Date  . Hypertension   . Bipolar 1 disorder   . Hypothyroidism   . Memory difficulties   . HLD (hyperlipidemia)   . Diverticulitis large intestine   . Pneumonia     History  Substance Use Topics  . Smoking status: Never Smoker   .  Smokeless tobacco: Never Used  . Alcohol Use: No    Family History  Problem Relation Age of Onset  . Heart disease Father    Allergies  Allergen Reactions  . Antihistamine Decongestant [Triprolidine-Pse] Diarrhea  . Codeine Other (See Comments)    Gi upset     OBJECTIVE: Blood pressure 164/97, pulse 105, temperature 99 F (37.2 C), temperature source Axillary, resp. rate 32, height  (1.676 m), weight 161 lb 6 oz (73.2 kg), SpO2 95 %. General: She is unresponsive to voice Skin: Pale with no rash Neck: Supple Lungs: Clear Cor: Regular S1 and S2 with no murmurs Abdomen: Obese, soft and nontender. Healed midline incision. Foley catheter in place. Neurologic: No  rigidity noted  Lab Results Lab Results  Component Value Date   WBC 13.4* 06/18/2014   HGB 14.8 06/18/2014   HCT 51.2* 06/18/2014   MCV 106.4* 06/18/2014   PLT 240 06/18/2014    Lab Results  Component Value Date   CREATININE 1.98* 06/18/2014   BUN 30* 06/18/2014   NA 168* 06/18/2014   K 3.8 06/18/2014   CL >130* 06/18/2014   CO2 25 06/18/2014    Lab Results  Component Value Date   ALT 37* 06/18/2014   AST 35 06/18/2014   ALKPHOS 88 06/18/2014   BILITOT 0.5 06/18/2014     Microbiology: Recent Results (from the past 240 hour(s))  Urine culture     Status: None   Collection Time: 06/16/14 11:38 AM  Result Value Ref Range Status   Specimen Description URINE, CATHETERIZED  Final   Special Requests NONE  Final   Colony Count NO GROWTH Performed at [facility]   Final   Culture NO GROWTH Performed at [facility]   Final   Report Status 06/17/2014 FINAL  Final  Culture, blood (routine x 2)     Status: None (Preliminary result)   Collection Time: 06/16/14 11:53 AM  Result Value Ref Range Status   Specimen Description BLOOD RIGHT HAND  Final   Special Requests BOTTLES DRAWN AEROBIC AND ANAEROBIC 5CC  Final   Culture   Final           BLOOD CULTURE RECEIVED NO GROWTH TO DATE CULTURE WILL BE HELD FOR 5 DAYS BEFORE ISSUING A FINAL NEGATIVE REPORT Performed at [facility]    Report Status PENDING  Incomplete  Culture, blood (routine x 2)     Status: None (Preliminary result)   Collection Time: 06/16/14 11:53 AM  Result Value Ref Range Status   Specimen Description BLOOD LEFT HAND  Final   Special Requests BOTTLES DRAWN AEROBIC AND ANAEROBIC 4CC  Final   Culture   Final           BLOOD CULTURE RECEIVED NO GROWTH TO DATE CULTURE WILL BE HELD FOR 5 DAYS BEFORE ISSUING A FINAL NEGATIVE REPORT Performed at [facility]    Report Status PENDING  Incomplete  Gram stain     Status: None   Collection Time: 06/16/14  5:07 PM  Result Value Ref  Range Status   Specimen Description CSF  Final   Special Requests NONE  Final   Gram Stain   Final    NO ORGANISMS SEEN NO WBC SEEN CYTOSPIN Gram Stain Report Called to,Read Back By and Verified With: ALDRIDGE,D. RN  ON 1.13.16 BY MCCOY,N.    Report Status 06/16/2014 FINAL  Final  CSF culture     Status: None (Preliminary result)   Collection  Time: 06/16/14  5:07 PM  Result Value Ref Range Status   Specimen Description CSF  Final   Special Requests NONE  Final   Gram Stain   Final    NO WBC SEEN NO ORGANISMS SEEN Performed at [facility]    Culture PENDING  Incomplete   Report Status PENDING  Incomplete  MRSA PCR Screening     Status: Abnormal   Collection Time: 06/16/14  5:19 PM  Result Value Ref Range Status   MRSA by PCR POSITIVE (A) NEGATIVE Final    Comment:        The GeneXpert MRSA Assay (FDA approved for NASAL specimens only), is one component of a comprehensive MRSA colonization surveillance program. It is not intended to diagnose MRSA infection nor to guide or monitor treatment for MRSA infections. RESULT CALLED TO, READ BACK BY AND VERIFIED WITH: C.DENNY,RN AT 2357 ON 06/16/14 BY SHEA.Leota SauersW     Almadelia Looman, MD [facility] [phone] pager   [phone] cell 06/18/2014, 10:14 AM

## 2014-06-18 NOTE — Transfer of Care (Addendum)
Immediate Anesthesia Transfer of Care Note  Patient: [name] [name]  Procedure(s) Performed: Intubation on floor  Patient Location: PACU  Anesthesia Type: N/A  Level of Consciousness: Spontaneous resp.   Airway & Oxygen Therapy:  Intubation with BVM awaiting vent.   Post-op Assessment: Report given to RN and Post -op Vital signs reviewed and stable  Post vital signs: Reviewed and stable  Complications: No apparent anesthesia complications

## 2014-06-18 NOTE — Progress Notes (Signed)
Clinical Social Work Department BRIEF PSYCHOSOCIAL ASSESSMENT 06/18/2014  Patient:  [name] [name],[name] [name]     Account Number:  [id number]     Admit date:  06/16/2014  Clinical Social Worker:  [name] [name],[name] Grissett, LCSW  Date/Time:  06/18/2014 01:30 PM  Referred by:  Physician  Date Referred:  06/18/2014 Referred for  SNF Placement   Other Referral:   Interview type:  Family Other interview type:    PSYCHOSOCIAL DATA Living Status:  FACILITY Admitted from facility:  [name] [name] Level of care:  Skilled Nursing Facility Primary support name:  [name] [name] Primary support relationship to patient:  CHILD, ADULT Degree of support available:   Strong    CURRENT CONCERNS Current Concerns  Post-Acute Placement   Other Concerns:    SOCIAL WORK ASSESSMENT / PLAN CSW received referral in order to assist with completing psychosocial assessment. CSW reviewed chart and attempted to meet with patient. Patient sleeping and unable to fully participate in assessment due to confusion. CSW called and spoke with son [name] Hazard(Matthew) via phone.    CSW introduced myself and explained role. Patient was living at home but [name] Patient had intended to go for [name] but was recently transferred to LT care because family did not feel it was safe for patient to return home. Son reports that he wants patient to return [name]. CSW left business card in room and encouraged family to call with any further questions.    CSW completed FL2 and placed on chart. CSW left a message with [name] to update them on patient's plans.   Assessment/plan status:  Psychosocial Support/Ongoing Assessment of Needs Other assessment/ plan:   Information/referral to community resources:   Will return to SNF    PATIENT'S/FAMILY'S RESPONSE TO PLAN OF CARE: Patient unable to participate in assessment. Son thanked CSW for call and reports he appreciates hospital concern that family is  updated. Son reports [facility] has [name] and knows she cannot live alone anymore. Son reports concern for patient and reports that he wishes that she could be diagnosed because he feels uneasy not doing what is specifically wrong with patient. Son reports he will visit patient and wants to be kept updated on plans.       [name] [name], [place] [phone]

## 2014-06-19 ENCOUNTER — Inpatient Hospital Stay (HOSPITAL_COMMUNITY): Payer: Medicare Other

## 2014-06-19 DIAGNOSIS — E870 Hyperosmolality and hypernatremia: Secondary | ICD-10-CM

## 2014-06-19 DIAGNOSIS — A419 Sepsis, unspecified organism: Principal | ICD-10-CM

## 2014-06-19 DIAGNOSIS — B9562 Methicillin resistant Staphylococcus aureus infection as the cause of diseases classified elsewhere: Secondary | ICD-10-CM

## 2014-06-19 DIAGNOSIS — J690 Pneumonitis due to inhalation of food and vomit: Secondary | ICD-10-CM

## 2014-06-19 DIAGNOSIS — J9601 Acute respiratory failure with hypoxia: Secondary | ICD-10-CM

## 2014-06-19 DIAGNOSIS — N179 Acute kidney failure, unspecified: Secondary | ICD-10-CM

## 2014-06-19 DIAGNOSIS — R4182 Altered mental status, unspecified: Secondary | ICD-10-CM | POA: Insufficient documentation

## 2014-06-19 DIAGNOSIS — J189 Pneumonia, unspecified organism: Secondary | ICD-10-CM

## 2014-06-19 DIAGNOSIS — R509 Fever, unspecified: Secondary | ICD-10-CM | POA: Insufficient documentation

## 2014-06-19 LAB — BLOOD GAS, ARTERIAL
Acid-base deficit: 3.6 mmol/L — ABNORMAL HIGH (ref 0.0–2.0)
BICARBONATE: 19.9 meq/L — AB (ref 20.0–24.0)
DRAWN BY: 235321
FIO2: 0.4 %
MECHVT: 470 mL
O2 Saturation: 98 %
PATIENT TEMPERATURE: 99.3
PEEP: 8 cmH2O
RATE: 18 resp/min
TCO2: 18.2 mmol/L (ref 0–100)
pCO2 arterial: 33.2 mmHg — ABNORMAL LOW (ref 35.0–45.0)
pH, Arterial: 7.398 (ref 7.350–7.450)
pO2, Arterial: 97 mmHg (ref 80.0–100.0)

## 2014-06-19 LAB — BASIC METABOLIC PANEL
Anion gap: 0 — ABNORMAL LOW (ref 5–15)
Anion gap: 1 — ABNORMAL LOW (ref 5–15)
BUN: 33 mg/dL — ABNORMAL HIGH (ref 6–23)
BUN: 31 mg/dL — AB (ref 6–23)
BUN: 31 mg/dL — ABNORMAL HIGH (ref 6–23)
BUN: 33 mg/dL — ABNORMAL HIGH (ref 6–23)
CO2: 23 mmol/L (ref 19–32)
CO2: 23 mmol/L (ref 19–32)
CO2: 23 mmol/L (ref 19–32)
CO2: 23 mmol/L (ref 19–32)
CREATININE: 1.94 mg/dL — AB (ref 0.50–1.10)
CREATININE: 1.72 mg/dL — AB (ref 0.50–1.10)
CREATININE: 1.61 mg/dL — AB (ref 0.50–1.10)
Calcium: 8.3 mg/dL — ABNORMAL LOW (ref 8.4–10.5)
Calcium: 8.3 mg/dL — ABNORMAL LOW (ref 8.4–10.5)
Calcium: 8.5 mg/dL (ref 8.4–10.5)
Calcium: 8.5 mg/dL (ref 8.4–10.5)
Chloride: 127 mEq/L — ABNORMAL HIGH (ref 96–112)
Chloride: 130 mEq/L — ABNORMAL HIGH (ref 96–112)
Chloride: 130 mEq/L — ABNORMAL HIGH (ref 96–112)
Chloride: 130 mEq/L — ABNORMAL HIGH (ref 96–112)
Creatinine, Ser: 1.84 mg/dL — ABNORMAL HIGH (ref 0.50–1.10)
GFR calc Af Amer: 28 mL/min — ABNORMAL LOW (ref >90)
GFR calc Af Amer: 33 mL/min — ABNORMAL LOW (ref >90)
GFR calc Af Amer: 35 mL/min — ABNORMAL LOW (ref >90)
GFR calc non Af Amer: 24 mL/min — ABNORMAL LOW (ref >90)
GFR calc non Af Amer: 26 mL/min — ABNORMAL LOW (ref >90)
GFR, EST AFRICAN AMERICAN: 30 mL/min — AB (ref >90)
GFR, EST NON AFRICAN AMERICAN: 28 mL/min — AB (ref >90)
GFR, EST NON AFRICAN AMERICAN: 30 mL/min — AB (ref >90)
GLUCOSE: 187 mg/dL — AB (ref 70–99)
Glucose, Bld: 206 mg/dL — ABNORMAL HIGH (ref 70–99)
Glucose, Bld: 143 mg/dL — ABNORMAL HIGH (ref 70–99)
Glucose, Bld: 174 mg/dL — ABNORMAL HIGH (ref 70–99)
POTASSIUM: 4.2 mmol/L (ref 3.5–5.1)
Potassium: 3.2 mmol/L — ABNORMAL LOW (ref 3.5–5.1)
Potassium: 3.1 mmol/L — ABNORMAL LOW (ref 3.5–5.1)
Potassium: 3.7 mmol/L (ref 3.5–5.1)
SODIUM: 154 mmol/L — AB (ref 135–145)
Sodium: 150 mmol/L — ABNORMAL HIGH (ref 135–145)
Sodium: 154 mmol/L — ABNORMAL HIGH (ref 135–145)
Sodium: 155 mmol/L — ABNORMAL HIGH (ref 135–145)

## 2014-06-19 LAB — GLUCOSE, CAPILLARY
Glucose-Capillary: 142 mg/dL — ABNORMAL HIGH (ref 70–99)
Glucose-Capillary: 123 mg/dL — ABNORMAL HIGH (ref 70–99)
Glucose-Capillary: 132 mg/dL — ABNORMAL HIGH (ref 70–99)

## 2014-06-19 LAB — HEPATITIS PANEL, ACUTE
HCV AB: NEGATIVE
HEP B C IGM: NONREACTIVE
HEP B S AG: NEGATIVE
Hep A IgM: NONREACTIVE

## 2014-06-19 LAB — CBC
HCT: 39.7 % (ref 36.0–46.0)
Hemoglobin: 11.3 g/dL — ABNORMAL LOW (ref 12.0–15.0)
MCH: 30.3 pg (ref 26.0–34.0)
MCHC: 28.5 g/dL — AB (ref 30.0–36.0)
MCV: 106.4 fL — ABNORMAL HIGH (ref 78.0–100.0)
Platelets: 167 10*3/uL (ref 150–400)
RBC: 3.73 MIL/uL — ABNORMAL LOW (ref 3.87–5.11)
RDW: 14 % (ref 11.5–15.5)
WBC: 14.2 10*3/uL — ABNORMAL HIGH (ref 4.0–10.5)

## 2014-06-19 LAB — MAGNESIUM: Magnesium: 2.2 mg/dL (ref 1.5–2.5)

## 2014-06-19 MED ORDER — SODIUM CHLORIDE 0.9 % IV BOLUS (SEPSIS)
500.0000 mL | Freq: Once | INTRAVENOUS | Status: AC
  Administered 2014-06-19: 500 mL via INTRAVENOUS

## 2014-06-19 MED ORDER — ALBUTEROL SULFATE (2.5 MG/3ML) 0.083% IN NEBU
2.5000 mg | INHALATION_SOLUTION | Freq: Four times a day (QID) | RESPIRATORY_TRACT | Status: DC
  Administered 2014-06-19 – 2014-06-23 (×18): 2.5 mg via RESPIRATORY_TRACT
  Filled 2014-06-19 (×19): qty 3

## 2014-06-19 MED ORDER — POTASSIUM CHLORIDE 20 MEQ/15ML (10%) PO SOLN
40.0000 meq | Freq: Once | ORAL | Status: AC
  Administered 2014-06-19: 40 meq via ORAL
  Filled 2014-06-19: qty 30

## 2014-06-19 MED ORDER — ACETAMINOPHEN 160 MG/5ML PO SOLN
650.0000 mg | Freq: Four times a day (QID) | ORAL | Status: DC | PRN
  Administered 2014-06-19 – 2014-06-25 (×6): 650 mg
  Filled 2014-06-19 (×6): qty 20.3

## 2014-06-19 MED ORDER — ACETYLCYSTEINE 20 % IN SOLN: 3.0000 mL | Freq: Four times a day (QID) | RESPIRATORY_TRACT | Status: DC

## 2014-06-19 MED ORDER — POTASSIUM CHLORIDE 20 MEQ/15ML (10%) PO SOLN
40.0000 meq | Freq: Once | ORAL | Status: AC
Start: 2014-06-19 — End: 2014-06-19
  Administered 2014-06-19: 40 meq via ORAL
  Filled 2014-06-19: qty 30

## 2014-06-19 MED ORDER — FENTANYL CITRATE 0.05 MG/ML IJ SOLN
50.0000 ug | INTRAMUSCULAR | Status: DC | PRN
  Administered 2014-06-19 – 2014-06-21 (×14): 50 ug via INTRAVENOUS
  Filled 2014-06-19 (×14): qty 2

## 2014-06-19 MED ORDER — LEVOTHYROXINE SODIUM 150 MCG PO TABS
150.0000 ug | ORAL_TABLET | Freq: Every day | ORAL | Status: DC
  Administered 2014-06-20 – 2014-06-25 (×6): 150 ug
  Filled 2014-06-19 (×4): qty 2
  Filled 2014-06-19 (×2): qty 1

## 2014-06-19 MED ORDER — ENOXAPARIN SODIUM 30 MG/0.3ML ~~LOC~~ SOLN
30.0000 mg | SUBCUTANEOUS | Status: DC
Start: 2014-06-19 — End: 2014-06-20
  Administered 2014-06-19: 30 mg via SUBCUTANEOUS
  Filled 2014-06-19: qty 0.3

## 2014-06-19 MED ORDER — FREE WATER
200.0000 mL | Freq: Four times a day (QID) | Status: DC
  Administered 2014-06-19 – 2014-06-21 (×9): 200 mL

## 2014-06-19 MED ORDER — PANTOPRAZOLE SODIUM 40 MG PO PACK
40.0000 mg | PACK | ORAL | Status: DC
  Administered 2014-06-19 – 2014-06-25 (×6): 40 mg
  Filled 2014-06-19 (×8): qty 20

## 2014-06-19 MED ORDER — VITAMIN B-1 100 MG PO TABS
100.0000 mg | ORAL_TABLET | Freq: Every day | ORAL | Status: DC
  Administered 2014-06-19 – 2014-06-25 (×7): 100 mg
  Filled 2014-06-19 (×7): qty 1

## 2014-06-19 MED ORDER — ACETYLCYSTEINE 20 % IN SOLN
3.0000 mL | Freq: Three times a day (TID) | RESPIRATORY_TRACT | Status: AC
  Administered 2014-06-19 – 2014-06-23 (×14): 3 mL via RESPIRATORY_TRACT
  Filled 2014-06-19 (×16): qty 4

## 2014-06-19 MED ORDER — VITAL HIGH PROTEIN PO LIQD
1000.0000 mL | Freq: Every day | ORAL | Status: DC
  Administered 2014-06-19: 1000 mL
  Administered 2014-06-19: 20:00:00
  Administered 2014-06-20: 1000 mL
  Filled 2014-06-19 (×2): qty 1000

## 2014-06-19 MED ORDER — INSULIN ASPART 100 UNIT/ML ~~LOC~~ SOLN
0.0000 [IU] | SUBCUTANEOUS | Status: DC
  Administered 2014-06-19 – 2014-06-20 (×4): 2 [IU] via SUBCUTANEOUS
  Administered 2014-06-20 (×2): 3 [IU] via SUBCUTANEOUS
  Administered 2014-06-20 (×2): 2 [IU] via SUBCUTANEOUS
  Administered 2014-06-21: 3 [IU] via SUBCUTANEOUS
  Administered 2014-06-21: 2 [IU] via SUBCUTANEOUS
  Administered 2014-06-21: 5 [IU] via SUBCUTANEOUS
  Administered 2014-06-21: 3 [IU] via SUBCUTANEOUS
  Administered 2014-06-21: 5 [IU] via SUBCUTANEOUS
  Administered 2014-06-22: 2 [IU] via SUBCUTANEOUS
  Administered 2014-06-22: 8 [IU] via SUBCUTANEOUS
  Administered 2014-06-22 (×2): 5 [IU] via SUBCUTANEOUS
  Administered 2014-06-23 (×3): 2 [IU] via SUBCUTANEOUS
  Administered 2014-06-24: 3 [IU] via SUBCUTANEOUS
  Administered 2014-06-24 (×2): 2 [IU] via SUBCUTANEOUS
  Administered 2014-06-24: 3 [IU] via SUBCUTANEOUS
  Administered 2014-06-25: 2 [IU] via SUBCUTANEOUS
  Administered 2014-06-25: 3 [IU] via SUBCUTANEOUS

## 2014-06-19 MED ORDER — ASPIRIN 81 MG PO CHEW
81.0000 mg | CHEWABLE_TABLET | Freq: Every day | ORAL | Status: DC
  Administered 2014-06-19 – 2014-06-25 (×7): 81 mg
  Filled 2014-06-19 (×7): qty 1

## 2014-06-19 MED ORDER — NOREPINEPHRINE BITARTRATE 1 MG/ML IV SOLN
2.0000 ug/min | INTRAVENOUS | Status: DC
  Filled 2014-06-19: qty 4

## 2014-06-19 MED ORDER — METRONIDAZOLE IN NACL 5-0.79 MG/ML-% IV SOLN
500.0000 mg | Freq: Three times a day (TID) | INTRAVENOUS | Status: AC
  Administered 2014-06-19 – 2014-06-24 (×17): 500 mg via INTRAVENOUS
  Filled 2014-06-19 (×18): qty 100

## 2014-06-19 MED FILL — Medication: Qty: 1 | Status: AC

## 2014-06-19 NOTE — Progress Notes (Addendum)
Name: [name] [name] MRN: [id number] DOB: 1940-06-26    ADMISSION DATE:  06/16/2014  REFERRING MD :  Dr. Sanjuana Letters  CHIEF COMPLAINT:  AMS / Fever   BRIEF PATIENT DESCRIPTION:  74 yo female from [facility]F with fever (Tm 102F) and altered mental status.  Transferred to ICU with symptomatic bradycardia, VDRF.  SIGNIFICANT EVENTS  1/13  Admit with fever / AMS 1/15  ID consulted 1/15  Bradycardic arrest likely from respiratory failure, VDRF, to ICU  STUDIES:  1/13  CT Head >>  Negative  1/13  LP >> RBC 39, WBC 1 1/14  CT chest >> atherosclerosis 1/14  CT abd/pelvis >> CBD 10 mm, diverticulosis 1/15  MRI brain >> mild atrophy and small vessel disease 1/15  Echo >> mild LVH, EF 65 to 40%, grade 1 diastolic dysfx  SUBJECTIVE:  Remains on full vent support  VITAL SIGNS: Temp:  [98.8 F (37.1 C)-100.3 F (37.9 C)] 99.1 F (37.3 C) (01/16 0800) Pulse Rate:  [59-110] 66 (01/16 0800) Resp:  [15-26] 18 (01/16 0800) BP: (73-156)/(36-93) 91/54 mmHg (01/16 0800) SpO2:  [85 %-99 %] 95 % (01/16 0800) FiO2 (%):  [30 %-100 %] 30 % (01/16 0840) Weight:  [162 lb 0.6 oz (73.5 kg)] 162 lb 0.6 oz (73.5 kg) (01/15 2158)  PHYSICAL EXAMINATION: General: no distress Neuro: RASS -1, opens eyes with stimulation, not following commands HEENT:  ET tube in place Cardiovascular: regular, no murmur Lungs: scattered rhonchi Abdomen: soft, non tender Musculoskeletal: no edema Skin: no rashes    CBC Latest Ref Rng 06/19/2014 06/18/2014 06/18/2014  WBC 4.0 - 10.5 K/uL 14.2(H) 12.6(H) 13.4(H)  Hemoglobin 12.0 - 15.0 g/dL 11.3(L) 14.1 14.8  Hematocrit 36.0 - 46.0 % 39.7 49.8(H) 51.2(H)  Platelets 150 - 400 K/uL 167 185 240    BMP Latest Ref Rng 06/19/2014 06/18/2014 06/18/2014  Glucose 70 - 99 mg/dL 206(H) 277(H) 179(H)  BUN 6 - 23 mg/dL 33(H) 29(H) 30(H)  Creatinine 0.50 - 1.10 mg/dL 1.94(H) 2.26(H) 1.99(H)  Sodium 135 - 145 mmol/L 150(H) 158(H) 165(HH)  Potassium 3.5 - 5.1 mmol/L 3.2(L) 3.8 3.5  Chloride 96  - 112 mEq/L 127(H) >130(HH) >130(HH)  CO2 19 - 32 mmol/L 23 21 27   Calcium 8.4 - 10.5 mg/dL 8.3(L) 9.1 9.6    Hepatic Function Latest Ref Rng 06/18/2014 06/18/2014 06/17/2014  Total Protein 6.0 - 8.3 g/dL 6.2 6.6 6.6  Albumin 3.5 - 5.2 g/dL 3.1(L) 3.5 3.6  AST 0 - 37 U/L 70(H) 35 53(H)  ALT 0 - 35 U/L 56(H) 37(H) 45(H)  Alk Phosphatase 39 - 117 U/L 83 88 89  Total Bilirubin 0.3 - 1.2 mg/dL 0.7 0.5 0.8    CBG (last 3)  No results for input(s): GLUCAP in the last 72 hours.   ABG    Component Value Date/Time   PHART 7.398 06/19/2014 0432   PCO2ART 33.2* 06/19/2014 0432   PO2ART 97.0 06/19/2014 0432   HCO3 19.9* 06/19/2014 0432   TCO2 18.2 06/19/2014 0432   ACIDBASEDEF 3.6* 06/19/2014 0432   O2SAT 98.0 06/19/2014 0432    Ct Abdomen Pelvis Wo Contrast  06/17/2014   CLINICAL DATA:  74 year old female with fever, sepsis and decreased level of consciousness.  EXAM: CT CHEST, ABDOMEN AND PELVIS WITHOUT CONTRAST  TECHNIQUE: Multidetector CT imaging of the chest, abdomen and pelvis was performed following the standard protocol without IV contrast.  COMPARISON:  No priors.  FINDINGS: CT CHEST FINDINGS  Mediastinum/Lymph Nodes: Heart size is normal. There is no significant  pericardial fluid, thickening or pericardial calcification. Atherosclerosis of the thoracic aorta and great vessels of the mediastinum, including a ectasia of the isthmus of the thoracic aorta which measures up to 3.8 cm in diameter. No pathologically enlarged mediastinal, hilar or axillary lymph nodes. Esophagus is unremarkable in appearance.  Lungs/Pleura: In the basal portions of the lower lobes of the lungs bilaterally (left greater than right), there is some thickening of the peribronchovascular interstitium and focal architectural distortion with volume loss, favored to reflect chronic scarring. No consolidative airspace disease. No pleural effusions. No suspicious appearing pulmonary nodules or masses.  Musculoskeletal/Soft  Tissues: There are no aggressive appearing lytic or blastic lesions noted in the visualized portions of the skeleton.  CT ABDOMEN AND PELVIS FINDINGS  Hepatobiliary: The gallbladder is not visualized and is likely surgically absent (no surgical clips are noted in the region of the gallbladder fossa). Common bile duct appears mildly dilated measuring 10 mm in the porta hepatis. There also appears to be mild intrahepatic biliary ductal dilatation, although assessment is limited on today's non contrast CT examination. No discrete hepatic lesions are noted on today's non contrast CT examination.  Pancreas: Unremarkable.  Spleen: Unremarkable.  Adrenals/Urinary Tract: There is some stranding around the right adrenal gland. No discrete adrenal mass or nodule identified. Left adrenal gland is unremarkable in appearance. Small amount of perinephric stranding adjacent to the kidneys bilaterally (nonspecific). The unenhanced appearance of the kidneys is otherwise unremarkable. No hydroureteronephrosis. Foley balloon catheter with tip in the lumen of the urinary bladder. Small amount of nondependent gas in the lumen of the urinary bladder, iatrogenic.  Stomach/Bowel: The unenhanced appearance of the stomach is normal. No pathologic dilatation of small bowel or colon. Numerous colonic diverticulae are noted, particularly in the sigmoid colon, without surrounding inflammatory changes to suggest an acute diverticulitis at this time.  Vascular/Lymphatic: Mild atherosclerotic calcifications throughout the abdominal and pelvic vasculature, without definite aneurysm. No lymphadenopathy identified in the abdomen or pelvis on today's non contrast CT examination.  Reproductive: Uterus and ovaries are atrophic.  Other: No significant volume of ascites.  No pneumoperitoneum.  Musculoskeletal: There are no aggressive appearing lytic or blastic lesions noted in the visualized portions of the skeleton.  IMPRESSION: 1. No definite source to  explain the patient's sepsis identified on today's examination. 2. There is mild intra and extrahepatic biliary ductal dilatation, which likely reflects post cholecystectomy physiology in this patient. Correlation with liver function tests is recommended, however. 3. Thickening of the peribronchovascular interstitium throughout the basal segments of the lower lobes of the lungs bilaterally (left greater than right), most likely to reflect areas of chronic post infectious or inflammatory scarring. 4. Atherosclerosis, including ectasia of the isthmus of the thoracic aorta which measures up to 3.8 cm in diameter. 5. Colonic diverticulosis without findings to suggest acute diverticulitis at this time. 6. Additional incidental findings, as above.   Electronically Signed   By: Vinnie Langton M.D.   On: 06/17/2014 16:44   Ct Chest Wo Contrast  06/17/2014   CLINICAL DATA:  74 year old female with fever, sepsis and decreased level of consciousness.  EXAM: CT CHEST, ABDOMEN AND PELVIS WITHOUT CONTRAST  TECHNIQUE: Multidetector CT imaging of the chest, abdomen and pelvis was performed following the standard protocol without IV contrast.  COMPARISON:  No priors.  FINDINGS: CT CHEST FINDINGS  Mediastinum/Lymph Nodes: Heart size is normal. There is no significant pericardial fluid, thickening or pericardial calcification. Atherosclerosis of the thoracic aorta and great vessels of the mediastinum,  including a ectasia of the isthmus of the thoracic aorta which measures up to 3.8 cm in diameter. No pathologically enlarged mediastinal, hilar or axillary lymph nodes. Esophagus is unremarkable in appearance.  Lungs/Pleura: In the basal portions of the lower lobes of the lungs bilaterally (left greater than right), there is some thickening of the peribronchovascular interstitium and focal architectural distortion with volume loss, favored to reflect chronic scarring. No consolidative airspace disease. No pleural effusions. No  suspicious appearing pulmonary nodules or masses.  Musculoskeletal/Soft Tissues: There are no aggressive appearing lytic or blastic lesions noted in the visualized portions of the skeleton.  CT ABDOMEN AND PELVIS FINDINGS  Hepatobiliary: The gallbladder is not visualized and is likely surgically absent (no surgical clips are noted in the region of the gallbladder fossa). Common bile duct appears mildly dilated measuring 10 mm in the porta hepatis. There also appears to be mild intrahepatic biliary ductal dilatation, although assessment is limited on today's non contrast CT examination. No discrete hepatic lesions are noted on today's non contrast CT examination.  Pancreas: Unremarkable.  Spleen: Unremarkable.  Adrenals/Urinary Tract: There is some stranding around the right adrenal gland. No discrete adrenal mass or nodule identified. Left adrenal gland is unremarkable in appearance. Small amount of perinephric stranding adjacent to the kidneys bilaterally (nonspecific). The unenhanced appearance of the kidneys is otherwise unremarkable. No hydroureteronephrosis. Foley balloon catheter with tip in the lumen of the urinary bladder. Small amount of nondependent gas in the lumen of the urinary bladder, iatrogenic.  Stomach/Bowel: The unenhanced appearance of the stomach is normal. No pathologic dilatation of small bowel or colon. Numerous colonic diverticulae are noted, particularly in the sigmoid colon, without surrounding inflammatory changes to suggest an acute diverticulitis at this time.  Vascular/Lymphatic: Mild atherosclerotic calcifications throughout the abdominal and pelvic vasculature, without definite aneurysm. No lymphadenopathy identified in the abdomen or pelvis on today's non contrast CT examination.  Reproductive: Uterus and ovaries are atrophic.  Other: No significant volume of ascites.  No pneumoperitoneum.  Musculoskeletal: There are no aggressive appearing lytic or blastic lesions noted in the  visualized portions of the skeleton.  IMPRESSION: 1. No definite source to explain the patient's sepsis identified on today's examination. 2. There is mild intra and extrahepatic biliary ductal dilatation, which likely reflects post cholecystectomy physiology in this patient. Correlation with liver function tests is recommended, however. 3. Thickening of the peribronchovascular interstitium throughout the basal segments of the lower lobes of the lungs bilaterally (left greater than right), most likely to reflect areas of chronic post infectious or inflammatory scarring. 4. Atherosclerosis, including ectasia of the isthmus of the thoracic aorta which measures up to 3.8 cm in diameter. 5. Colonic diverticulosis without findings to suggest acute diverticulitis at this time. 6. Additional incidental findings, as above.   Electronically Signed   By: Vinnie Langton M.D.   On: 06/17/2014 16:44   Mr Brain Wo Contrast  06/18/2014   CLINICAL DATA:  Possible CVA. Encephalopathy. Unresponsive. Fever. Acute mental status decline over the past 3 days.  EXAM: MRI HEAD WITHOUT CONTRAST  TECHNIQUE: Multiplanar, multiecho pulse sequences of the brain and surrounding structures were obtained without intravenous contrast.  COMPARISON:  CT head 06/16/2014.  FINDINGS: No evidence for acute infarction, hemorrhage, mass lesion, hydrocephalus, or extra-axial fluid. Generalized atrophy. Mild subcortical and periventricular T2 and FLAIR hyperintensities, likely chronic microvascular ischemic change. Flow voids are maintained throughout the carotid, basilar, and vertebral arteries. There are no areas of chronic hemorrhage. Pituitary, pineal, and cerebellar  tonsils unremarkable. No upper cervical lesions. Visualized calvarium, skull base, and upper cervical osseous structures unremarkable. Scalp and extracranial soft tissues, orbits, sinuses, and mastoids show no acute process.  IMPRESSION: Mild atrophy and small vessel disease. No acute  intracranial abnormality.   Electronically Signed   By: Rolla Flatten M.D.   On: 06/18/2014 12:14   Dg Chest Port 1 View  06/19/2014   CLINICAL DATA:  Central line placement.  Initial encounter.  EXAM: PORTABLE CHEST - 1 VIEW  COMPARISON:  Chest radiograph performed 06/18/2014  FINDINGS: The patient's left IJ line is seen ending about the mid to distal SVC. The endotracheal tube is seen ending 2-3 cm above the carina. The enteric tube is noted extending below the diaphragm.  Mild left midlung and left basilar airspace opacity raises concern for mild pneumonia. Minimal right basilar opacity is seen. No pleural effusion or pneumothorax is seen; the left costophrenic angle is incompletely imaged on the study.  The cardiomediastinal silhouette remains normal in size. No acute osseous abnormalities are identified.  IMPRESSION: 1. Left IJ line seen ending about the mid to distal SVC. 2. Mild left midlung and left basilar airspace opacity raises concern for mild pneumonia, more defined than on the prior study. Minimal right basilar opacity also seen.   Electronically Signed   By: Garald Balding M.D.   On: 06/19/2014 03:41   Dg Chest Port 1 View  06/18/2014   CLINICAL DATA:  Endotracheal tube placement. Recent CPR, with respiratory distress. Initial encounter.  EXAM: PORTABLE CHEST - 1 VIEW  COMPARISON:  CT of the chest performed 06/17/2014, and chest radiograph performed 06/16/2014  FINDINGS: The patient's endotracheal tube is seen ending 3 cm above the carina.  There is mild elevation of the left hemidiaphragm. Left basilar airspace opacification may reflect atelectasis or possibly pneumonia. Given recent CPR, aspiration cannot be excluded. The right lung appears clear. No definite pleural effusion or pneumothorax is seen.  The cardiomediastinal silhouette remains borderline normal in size. No acute osseous abnormalities are seen.  IMPRESSION: 1. Endotracheal tube seen ending 3 cm above the carina. 2. Mild elevation  of the left hemidiaphragm. Left basilar airspace opacification may reflect atelectasis or possibly pneumonia. Given the recent history, aspiration cannot be excluded.   Electronically Signed   By: Garald Balding M.D.   On: 06/18/2014 22:16   Dg Abd Portable 1v  06/19/2014   CLINICAL DATA:  OG tube placement.  EXAM: PORTABLE ABDOMEN - 1 VIEW  COMPARISON:  CT earlier this day.  FINDINGS: The tip and side port of the enteric tube are below the diaphragm in the stomach. There is a nonobstructive bowel gas pattern. No evidence of free air.  IMPRESSION: Tip and side port of the enteric tube below the diaphragm in the stomach.   Electronically Signed   By: Jeb Levering M.D.   On: 06/19/2014 02:43        ASSESSMENT / PLAN:  PULMONARY ETT 1/15 >>  A: Acute respiratory failure 2nd to recurrent aspiration PNA, bradycardia. P: Full vent support F/u CXR Add BD's, mucomyst, bronchial hygiene  CARDIAC Lt IJ CVL 1/15 >> A: Symptomatic bradycardia >> likely from respiratory failure. Elevated troponin. Hx of HTN. P: Monitor hemodynamics Continue ASA Hold outpt norvasc, zocor  RENAL A: Hypernatremia. AKI 2nd to volume depletion. P: Change IV fluid to D5W at 75 ml/hr Add free water 1/16 F/u BMET  GASTROENTEROLOGY A: Dysphagia. Nutriton. P: Add tube feeds 1/16 protonix for SUP Will need  speech assessment after extubation F/u LFT's  HEMATOLOGY A: Mild anemia. P: F/u CBC Lovenox for DVT prevention  INFECTION A: Sepsis 2nd to recurrent aspiration PNA. P: Day 4 vancomycin, cefepime  MRSA 1/13 >> POSITIVE Blood 1/13 >> CSF 1/13 >> Sputum 1/16 >>  ENDOCRINE A: Hx of hypothyroidism. Hyperglycemia. P: Continue levothyroxine SSI  NEUROLOGY A: Acute encephalopathy 2nd to sepsis, renal failure, hypernatremia. Hx of Bipolar 1 disorder. P: RASS goal 0, PAD 1 protocol Hold outpt cogentin, tegretol, lithium, restoril, tramadol Will need psych evaluation once more  stable to assist with bipolar regimen  Summary: She likely had recurrent aspiration PNA leading to respiratory and then cardiac arrest.  Updated pt's daughter-in-law Almyra Free at bedside (she is former ICU nurse and now works as Immunologist at [employer]).  CC time 55 minute.  Chesley Mires, MD [facility] Pulmonary/Critical Care 06/19/2014, 9:47 AM Pager:  [phone] After 3pm call: [phone]

## 2014-06-19 NOTE — Progress Notes (Signed)
A total of 4 attempts by 2 RT's made for arterial line insertion without success. RN aware and has placed call for the help of anesthesia.

## 2014-06-19 NOTE — Procedures (Signed)
RT called to room by RN because of pt desat.  Upon entering room, RT noticed that pt was not taking a breath while on the vent and sat was not reading.  RT removed pt from vent and began to manually ambu the pt with 100%.  PT was difficult to ambu so RT lavaged, ambued, and then suctioned pt.  RT was able to obtain copious amounts of secretions that were thick and yellow. After sats increased and secretions removed, pt was placed back on the vent.  Dr. Craige CottaSood was notified about the incident.

## 2014-06-19 NOTE — Progress Notes (Addendum)
ANTICOAGULATION CONSULT NOTE - Initial Consult  Pharmacy Consult for Lovenox Indication: VTE prophylaxis  Allergies  Allergen Reactions  . Antihistamine Decongestant [Triprolidine-Pse] Diarrhea  . Codeine Other (See Comments)    Gi upset    Patient Measurements: Height: 5\' 8"  (172.7 cm) Weight: 162 lb 0.6 oz (73.5 kg) IBW/kg (Calculated) : 63.9  Vital Signs: Temp: 99.1 F (37.3 C) (01/16 0800) Temp Source: Oral (01/16 0800) BP: 91/54 mmHg (01/16 0800) Pulse Rate: 66 (01/16 0800)  Labs:  Recent Labs  06/16/14 2154 06/17/14 0530 06/18/14 0508  06/18/14 1357 06/18/14 1658 06/18/14 2211 06/19/14 0400  HGB  --  13.7 14.8  --   --   --  14.1 11.3*  HCT  --  46.8* 51.2*  --   --   --  49.8* 39.7  PLT  --  234 240  --   --   --  185 167  CREATININE  --  1.58* 1.98*  < > 2.02* 1.99* 2.26* 1.94*  CKTOTAL  --   --   --   --  91  --   --   --   CKMB  --   --   --   --  2.4  --   --   --   TROPONINI 0.08* 0.08*  --   --   --   --  0.09*  --   < > = values in this interval not displayed.  Estimated Creatinine Clearance: 25.7 mL/min (by C-G formula based on Cr of 1.94).   Medical History: Past Medical History  Diagnosis Date  . Hypertension   . Bipolar 1 disorder   . Hypothyroidism   . Memory difficulties   . HLD (hyperlipidemia)   . Diverticulitis large intestine   . Pneumonia     Medications:  Scheduled:  . acetylcysteine  3 mL Nebulization 4 times per day  . albuterol  2.5 mg Nebulization Q6H  . antiseptic oral rinse  7 mL Mouth Rinse q12n4p  . aspirin  81 mg Per Tube Daily  . ceFEPime (MAXIPIME) IV  1 g Intravenous Q24H  . chlorhexidine  15 mL Mouth Rinse BID  . Chlorhexidine Gluconate Cloth  6 each Topical Q0600  . feeding supplement (VITAL HIGH PROTEIN)  1,000 mL Per Tube Daily  . free water  200 mL Per Tube Q6H  . [START ON 06/20/2014] levothyroxine  150 mcg Per Tube QAC breakfast  . mupirocin ointment  1 application Nasal BID  . pantoprazole sodium  40  mg Per Tube Q24H  . sodium chloride  3 mL Intravenous Q12H  . thiamine  100 mg Per Tube Daily  . vancomycin  750 mg Intravenous Q24H    Assessment: 74yo known to pharmacy for abx dosing. Pharmacy is asked to dose Lovenox for VTE ppx. Has been on SQ heparin since admission. Last dose given at 0602 this am. SCr is elevated at 1.98, CrCl ~26CG. CBC is ok. Platelets have been trending down.  Goal of Therapy:  Regimen appropriate for renal function and weight. Monitor platelets by anticoagulation protocol: Yes   Plan:  Start Lovenox 30mg  SQ q24h at 1400 today. F/u daily.  Charolotte Ekeom Ja Pistole, PharmD, pager [phone]. 06/19/2014,9:56 AM.

## 2014-06-19 NOTE — Progress Notes (Signed)
[facility]    Subjective: Appears to want to come off the vent, but not reaching for the tube   Antibiotics:  Anti-infectives    Start     Dose/Rate Route Frequency Ordered Stop   06/18/14 2200  ceFEPIme (MAXIPIME) 1 g in dextrose 5 % 50 mL IVPB     1 g100 mL/hr over 30 Minutes Intravenous Every 24 hours 06/18/14 0800     06/18/14 1200  vancomycin (VANCOCIN) IVPB 750 mg/150 ml premix     750 mg150 mL/hr over 60 Minutes Intravenous Every 24 hours 06/18/14 0800     06/18/14 1100  acyclovir (ZOVIRAX) 650 mg in dextrose 5 % 100 mL IVPB  Status:  Discontinued     10 mg/kg  64.9 kg (Adjusted)113 mL/hr over 60 Minutes Intravenous Daily 06/18/14 0854 06/18/14 1022   06/18/14 1000  azithromycin (ZITHROMAX) 500 mg in dextrose 5 % 250 mL IVPB  Status:  Discontinued     500 mg250 mL/hr over 60 Minutes Intravenous Every 24 hours 06/18/14 0854 06/18/14 1022   06/17/14 1200  vancomycin (VANCOCIN) IVPB 1000 mg/200 mL premix  Status:  Discontinued     1,000 mg200 mL/hr over 60 Minutes Intravenous Every 24 hours 06/16/14 1236 06/18/14 0800   06/16/14 2200  ceFEPIme (MAXIPIME) 1 g in dextrose 5 % 50 mL IVPB  Status:  Discontinued     1 g100 mL/hr over 30 Minutes Intravenous Every 12 hours 06/16/14 1236 06/18/14 0800   06/16/14 1200  ceFEPIme (MAXIPIME) 2 g in dextrose 5 % 50 mL IVPB     2 g100 mL/hr over 30 Minutes Intravenous STAT 06/16/14 1146 06/16/14 1252   06/16/14 1200  vancomycin (VANCOCIN) 1,500 mg in sodium chloride 0.9 % 500 mL IVPB     1,500 mg250 mL/hr over 120 Minutes Intravenous STAT 06/16/14 1148 06/16/14 1434      Medications: Scheduled Meds: . acetylcysteine  3 mL Nebulization TID  . albuterol  2.5 mg Nebulization Q6H  . antiseptic oral rinse  7 mL Mouth Rinse q12n4p  . aspirin  81 mg Per Tube Daily  . ceFEPime (MAXIPIME) IV  1 g Intravenous Q24H  . chlorhexidine  15 mL Mouth Rinse BID  . Chlorhexidine Gluconate Cloth  6 each Topical Q0600  .  enoxaparin (LOVENOX) injection  30 mg Subcutaneous Q24H  . feeding supplement (VITAL HIGH PROTEIN)  1,000 mL Per Tube Daily  . free water  200 mL Per Tube Q6H  . insulin aspart  0-15 Units Subcutaneous 6 times per day  . [START ON 06/20/2014] levothyroxine  150 mcg Per Tube QAC breakfast  . mupirocin ointment  1 application Nasal BID  . pantoprazole sodium  40 mg Per Tube Q24H  . sodium chloride  3 mL Intravenous Q12H  . thiamine  100 mg Per Tube Daily  . vancomycin  750 mg Intravenous Q24H   Continuous Infusions: . dextrose 75 mL/hr at 06/19/14 1138  . norepinephrine (LEVOPHED) Adult infusion     PRN Meds:.acetaminophen (TYLENOL) oral liquid 160 mg/5 mL, albuterol, fentaNYL, [DISCONTINUED] ondansetron **OR** ondansetron (ZOFRAN) IV    Objective: Weight change:   Intake/Output Summary (Last 24 hours) at 06/19/14 1550 Last data filed at 06/19/14 1400  Gross per 24 hour  Intake 6509.17 ml  Output   1645 ml  Net 4864.17 ml   Blood pressure 110/60, pulse 81, temperature 99 F (37.2 C), temperature source Oral, resp. rate 27, height  (1.727 m), weight  162 lb 0.6 oz (73.5 kg), SpO2 96 %. Temp:  [98.8 F (37.1 C)-99.3 F (37.4 C)] 99 F (37.2 C) (01/16 1200) Pulse Rate:  [59-110] 81 (01/16 1400) Resp:  [15-27] 27 (01/16 1400) BP: (73-135)/(36-92) 110/60 mmHg (01/16 1400) SpO2:  [85 %-99 %] 96 % (01/16 1400) FiO2 (%):  [30 %-100 %] 40 % (01/16 1350) Weight:  [162 lb 0.6 oz (73.5 kg)] 162 lb 0.6 oz (73.5 kg) (01/15 2158)  Physical Exam: General: Alert and awake, mouthing words with ETT HEENT: anicteric sclera, , EOMI CVS regular rate, normal r,  no murmur rubs or gallops Chest: clear to auscultation bilaterally, no wheezing, rales or rhonchi Abdomen: soft nontender, nondistended, normal bowel sounds, Neuro: nonfocal  CBC:  CBC Latest Ref Rng 06/19/2014 06/18/2014 06/18/2014  WBC 4.0 - 10.5 K/uL 14.2(H) 12.6(H) 13.4(H)  Hemoglobin 12.0 - 15.0 g/dL 11.3(L) 14.1 14.8    Hematocrit 36.0 - 46.0 % 39.7 49.8(H) 51.2(H)  Platelets 150 - 400 K/uL 167 185 240      BMET  Recent Labs  06/19/14 0400 06/19/14 0950  NA 150* 154*  K 3.2* 3.1*  CL 127* 130*  CO2 23 23  GLUCOSE 206* 187*  BUN 33* 31*  CREATININE 1.94* 1.84*  CALCIUM 8.3* 8.3*     Liver Panel   Recent Labs  06/18/14 0508 06/18/14 2211  PROT 6.6 6.2  ALBUMIN 3.5 3.1*  AST 35 70*  ALT 37* 56*  ALKPHOS 88 83  BILITOT 0.5 0.7       Sedimentation Rate No results for input(s): ESRSEDRATE in the last 72 hours. C-Reactive Protein No results for input(s): CRP in the last 72 hours.  Micro Results: Recent Results (from the past 720 hour(s))  Urine culture     Status: None   Collection Time: 06/16/14 11:38 AM  Result Value Ref Range Status   Specimen Description URINE, CATHETERIZED  Final   Special Requests NONE  Final   Colony Count NO GROWTH Performed at [facility]   Final   Culture NO GROWTH Performed at [facility]   Final   Report Status 06/17/2014 FINAL  Final  Culture, blood (routine x 2)     Status: None (Preliminary result)   Collection Time: 06/16/14 11:53 AM  Result Value Ref Range Status   Specimen Description BLOOD RIGHT HAND  Final   Special Requests BOTTLES DRAWN AEROBIC AND ANAEROBIC 5CC  Final   Culture   Final           BLOOD CULTURE RECEIVED NO GROWTH TO DATE CULTURE WILL BE HELD FOR 5 DAYS BEFORE ISSUING A FINAL NEGATIVE REPORT Performed at [facility]    Report Status PENDING  Incomplete  Culture, blood (routine x 2)     Status: None (Preliminary result)   Collection Time: 06/16/14 11:53 AM  Result Value Ref Range Status   Specimen Description BLOOD LEFT HAND  Final   Special Requests BOTTLES DRAWN AEROBIC AND ANAEROBIC 4CC  Final   Culture   Final           BLOOD CULTURE RECEIVED NO GROWTH TO DATE CULTURE WILL BE HELD FOR 5 DAYS BEFORE ISSUING A FINAL NEGATIVE REPORT Performed at [facility]    Report  Status PENDING  Incomplete  Gram stain     Status: None   Collection Time: 06/16/14  5:07 PM  Result Value Ref Range Status   Specimen Description CSF  Final   Special Requests NONE  Final   Gram Stain   Final    NO ORGANISMS SEEN NO WBC SEEN CYTOSPIN Gram Stain Report Called to,Read Back By and Verified With: ALDRIDGE,D. RN @1859  ON 1.13.16 BY MCCOY,N.    Report Status 06/16/2014 FINAL  Final  CSF culture     Status: None (Preliminary result)   Collection Time: 06/16/14  5:07 PM  Result Value Ref Range Status   Specimen Description CSF  Final   Special Requests NONE  Final   Gram Stain   Final    NO WBC SEEN NO ORGANISMS SEEN Performed at [facility]    Culture   Final    NO GROWTH 2 DAYS Performed at [facility]    Report Status PENDING  Incomplete  MRSA PCR Screening     Status: Abnormal   Collection Time: 06/16/14  5:19 PM  Result Value Ref Range Status   MRSA by PCR POSITIVE (A) NEGATIVE Final    Comment:        The GeneXpert MRSA Assay (FDA approved for NASAL specimens only), is one component of a comprehensive MRSA colonization surveillance program. It is not intended to diagnose MRSA infection nor to guide or monitor treatment for MRSA infections. RESULT CALLED TO, READ BACK BY AND VERIFIED WITH: C.DENNY,RN AT 2357 ON 06/16/14 BY SHEA.W     Studies/Results: Mr Brain Wo Contrast  06/18/2014   CLINICAL DATA:  Possible CVA. Encephalopathy. Unresponsive. Fever. Acute mental status decline over the past 3 days.  EXAM: MRI HEAD WITHOUT CONTRAST  TECHNIQUE: Multiplanar, multiecho pulse sequences of the brain and surrounding structures were obtained without intravenous contrast.  COMPARISON:  CT head 06/16/2014.  FINDINGS: No evidence for acute infarction, hemorrhage, mass lesion, hydrocephalus, or extra-axial fluid. Generalized atrophy. Mild subcortical and periventricular T2 and FLAIR hyperintensities, likely chronic microvascular ischemic change.  Flow voids are maintained throughout the carotid, basilar, and vertebral arteries. There are no areas of chronic hemorrhage. Pituitary, pineal, and cerebellar tonsils unremarkable. No upper cervical lesions. Visualized calvarium, skull base, and upper cervical osseous structures unremarkable. Scalp and extracranial soft tissues, orbits, sinuses, and mastoids show no acute process.  IMPRESSION: Mild atrophy and small vessel disease. No acute intracranial abnormality.   Electronically Signed   By: Davonna Belling M.D.   On: 06/18/2014 12:14   Dg Chest Port 1 View  06/19/2014   CLINICAL DATA:  Central line placement.  Initial encounter.  EXAM: PORTABLE CHEST - 1 VIEW  COMPARISON:  Chest radiograph performed 06/18/2014  FINDINGS: The patient's left IJ line is seen ending about the mid to distal SVC. The endotracheal tube is seen ending 2-3 cm above the carina. The enteric tube is noted extending below the diaphragm.  Mild left midlung and left basilar airspace opacity raises concern for mild pneumonia. Minimal right basilar opacity is seen. No pleural effusion or pneumothorax is seen; the left costophrenic angle is incompletely imaged on the study.  The cardiomediastinal silhouette remains normal in size. No acute osseous abnormalities are identified.  IMPRESSION: 1. Left IJ line seen ending about the mid to distal SVC. 2. Mild left midlung and left basilar airspace opacity raises concern for mild pneumonia, more defined than on the prior study. Minimal right basilar opacity also seen.   Electronically Signed   By: Roanna Raider M.D.   On: 06/19/2014 03:41   Dg Chest Port 1 View  06/18/2014   CLINICAL DATA:  Endotracheal tube placement. Recent CPR, with respiratory distress. Initial encounter.  EXAM:  PORTABLE CHEST - 1 VIEW  COMPARISON:  CT of the chest performed 06/17/2014, and chest radiograph performed 06/16/2014  FINDINGS: The patient's endotracheal tube is seen ending 3 cm above the carina.  There is mild  elevation of the left hemidiaphragm. Left basilar airspace opacification may reflect atelectasis or possibly pneumonia. Given recent CPR, aspiration cannot be excluded. The right lung appears clear. No definite pleural effusion or pneumothorax is seen.  The cardiomediastinal silhouette remains borderline normal in size. No acute osseous abnormalities are seen.  IMPRESSION: 1. Endotracheal tube seen ending 3 cm above the carina. 2. Mild elevation of the left hemidiaphragm. Left basilar airspace opacification may reflect atelectasis or possibly pneumonia. Given the recent history, aspiration cannot be excluded.   Electronically Signed   By: Roanna RaiderJeffery  Chang M.D.   On: 06/18/2014 22:16   Dg Abd Portable 1v  06/19/2014   CLINICAL DATA:  OG tube placement.  EXAM: PORTABLE ABDOMEN - 1 VIEW  COMPARISON:  CT earlier this day.  FINDINGS: The tip and side port of the enteric tube are below the diaphragm in the stomach. There is a nonobstructive bowel gas pattern. No evidence of free air.  IMPRESSION: Tip and side port of the enteric tube below the diaphragm in the stomach.   Electronically Signed   By: Rubye OaksMelanie  Ehinger M.D.   On: 06/19/2014 02:43      Assessment/Plan:  Principal Problem:   Fever Active Problems:   Bipolar 1 disorder   Essential hypertension   Hypothyroid   Acute kidney injury   Acute encephalopathy   Hypernatremia   Dehydration   Sepsis   Elevated troponin   Transaminitis   Macrocytosis without anemia   Atherosclerotic peripheral vascular disease    [name] Arhoebe [name] is a 74 y.o. female admittted from SNF found unresponsive, febrile worked up extensively for source of fever including CT chest abdomen, pelvis, CT head, MRI head, LP blood cultures, urine cultures, who had been vanco/cefepime.acyclovir, azithromycin, seen by my partner Dr. Orvan Falconerampbell. Pt had profound hypernatremia. My partner Dr. Orvan Falconerampbell actually had suspicion for possible Li toxicity (which she had in the past) vs  Neurleptic malignant syndrome. In the interim she has had what sounds to be an aspiration event with bradyarrrhythmia now intubated, and found to have copious secretions suctioned  #1 HCAP: --continue vanco cefepime, add flagyl --fu cultures --narrow  #2 FUO: this was brewing before her aspiration event. There is actually no evidence for any definite process that required antimicrobial rx priort to intubation   LOS: 3 days   Acey LavCornelius Van Dam 06/19/2014, 3:50 PM

## 2014-06-19 NOTE — Procedures (Signed)
Central Venous Catheter Insertion Procedure Note [name] [name] [id number] 07-Jul-1940  Procedure: Insertion of Central Venous Catheter Indications: Drug and/or fluid administration and Frequent blood sampling  Procedure Details Consent: Unable to obtain consent because of emergent medical necessity. Time Out: Verified patient identification, verified procedure, site/side was marked, verified correct patient position, special equipment/implants available, medications/allergies/relevent history reviewed, required imaging and test results available.  Performed  Maximum sterile technique was used including antiseptics, cap, gloves, gown, hand hygiene, mask and sheet. Skin prep: Chlorhexidine; local anesthetic administered A antimicrobial bonded/coated triple lumen catheter was placed in the left internal jugular vein using the Seldinger technique.  Evaluation Blood flow good Complications: No apparent complications Patient did tolerate procedure well. Chest X-ray ordered to verify placement.  CXR: normal.  [name] [name] 06/19/2014, 7:16 AM

## 2014-06-19 NOTE — Consult Note (Signed)
[facility]  [facility]   Code Blue CONSULT NOTE  Chief Complaint: Cardiac arrest/unresponsive   Level V Caveat: Unresponsive  History of present illness: I was contacted by the hospital for a CODE BLUE cardiac arrest upstairs and presented to the patient's bedside.    ROS: Unable to obtain, Level V caveat  Scheduled Meds: . antiseptic oral rinse  7 mL Mouth Rinse q12n4p  . aspirin  81 mg Oral Daily  . ceFEPime (MAXIPIME) IV  1 g Intravenous Q24H  . chlorhexidine  15 mL Mouth Rinse BID  . Chlorhexidine Gluconate Cloth  6 each Topical Q0600  . heparin  5,000 Units Subcutaneous 3 times per day  . LORazepam      . mupirocin ointment  1 application Nasal BID  . pantoprazole (PROTONIX) IV  40 mg Intravenous Q24H  . sodium chloride  3 mL Intravenous Q12H  . thiamine  100 mg Intravenous Daily  . vancomycin  750 mg Intravenous Q24H   Continuous Infusions: . dextrose 175 mL/hr at 06/18/14 1518  . propofol     PRN Meds:.acetaminophen **OR** acetaminophen, albuterol, hydrALAZINE, ondansetron **OR** ondansetron (ZOFRAN) IV Past Medical History  Diagnosis Date  . Hypertension   . Bipolar 1 disorder   . Hypothyroidism   . Memory difficulties   . HLD (hyperlipidemia)   . Diverticulitis large intestine   . Pneumonia    Past Surgical History  Procedure Laterality Date  . No past surgeries     History   Social History  . Marital Status: Single    Spouse Name: N/A    Number of Children: N/A  . Years of Education: N/A   Occupational History  . Not on file.   Social History Main Topics  . Smoking status: Never Smoker   . Smokeless tobacco: Never Used  . Alcohol Use: No  . Drug Use: No  . Sexual Activity: Not on file   Other Topics Concern  . Not on file   Social History Narrative   Allergies  Allergen Reactions  . Antihistamine Decongestant [Triprolidine-Pse] Diarrhea  . Codeine Other (See Comments)    Gi upset     Last  set of Vital Signs (not current) Filed Vitals:   06/18/14 2158  BP: 102/70  Pulse: 110  Temp: 99.3 F (37.4 C)  Resp: 15      Physical Exam Gen: unresponsive Cardiovascular: HR initially in the 20's Resp: Some minor spontaneous respirations. Breath sounds equal bilaterally with bagging, rhonchorous bilaterally.  Abd: nondistended  Neuro: GCS 6, unresponsive to pain  HEENT: No blood in posterior pharynx, gag reflex absent, Pupils 2mm bilaterally w/ minimal reactivity.  Neck: No crepitus  Musculoskeletal: No deformity  Skin: warm  Procedures  INTUBATION Performed by: CRNA at the bedside under my supervision.   CRITICAL CARE Performed by: Purvis SheffieldHARRISON, Alissia Lory, S Total critical care time: 30 min Critical care time was exclusive of separately billable procedures and treating other patients. Critical care was necessary to treat or prevent imminent or life-threatening deterioration. Critical care was time spent personally by me on the following activities: development of treatment plan with patient and/or surrogate as well as nursing, discussions with consultants, evaluation of patient's response to treatment, examination of patient, obtaining history from patient or surrogate, ordering and performing treatments and interventions, ordering and review of laboratory studies, ordering and review of radiographic studies, pulse oximetry and re-evaluation of patient's condition.  Cardiopulmonary Resuscitation (CPR) Procedure Note Directed/Performed by: Purvis SheffieldHARRISON, Ronita Hargreaves, SKathie Rhodes  I personally directed ancillary staff and/or performed CPR in an effort to regain return of spontaneous circulation and to maintain cardiac, neuro and systemic perfusion.     Date: 06/18/14  Rate: 125  Rhythm: sinus tachycardia  QRS Axis: right  Intervals: normal  ST/T Wave abnormalities: nonspecific ST/T changes  Conduction Disutrbances:right bundle branch block  Narrative Interpretation: T wave changes in V1-V3 likely  related to LVH and RBBB which is more prominent on this tracing  Old EKG Reviewed: changes noted   Medical Decision making  9:44 PM I was called to the floor for a code blue. Pt is a 19F here for fever, acute encephalopathy, renal insuff. Nursing reporting that tele informed them of a BP w/ sys in the 70's. When the nurse checked on the patient she found her unresponsive. CPR was initiated and a CODE BLUE was called. I responded to the code within several minutes. We continued CPR for about 30 more seconds and then checked for a pulse which was found. She had a heart rate in the 20s and was given 0.5 mg of atropine. Her heart rate then increased to the 120s. Blood pressure was found to be 111/66. 98% O2 sat when bagged by BVM.  I asked the CRNA that was present to intubate the patient as she was not responsive and had a GCS of 6. She did have some mild spontaneous respirations. Pupils were 2 mm bilaterally and minimally reactive. I asked  the mid-level provider who was working on the floor overnight to order a portable chest x-ray to confirm placement of the ET tube and some new lab work including CBC, CMP, and troponin. I got an EKG at the bedside which showed sinus tachycardia with a  right bundle branch block. I asked the nurses to give the patient  Ativan while we waited on a propofol drip. I discussed the case with critical care who will send a provider to see the patient. The patient is being transferred to the ICU currently.

## 2014-06-20 ENCOUNTER — Inpatient Hospital Stay (HOSPITAL_COMMUNITY): Payer: Medicare Other

## 2014-06-20 DIAGNOSIS — G9340 Encephalopathy, unspecified: Secondary | ICD-10-CM | POA: Insufficient documentation

## 2014-06-20 DIAGNOSIS — F319 Bipolar disorder, unspecified: Secondary | ICD-10-CM

## 2014-06-20 LAB — BASIC METABOLIC PANEL
ANION GAP: 7 (ref 5–15)
BUN: 35 mg/dL — AB (ref 6–23)
BUN: 33 mg/dL — ABNORMAL HIGH (ref 6–23)
CALCIUM: 8.5 mg/dL (ref 8.4–10.5)
CHLORIDE: 124 meq/L — AB (ref 96–112)
CO2: 23 mmol/L (ref 19–32)
CO2: 23 mmol/L (ref 19–32)
CREATININE: 1.54 mg/dL — AB (ref 0.50–1.10)
Calcium: 8.7 mg/dL (ref 8.4–10.5)
Chloride: >130 mEq/L (ref 96–112)
Creatinine, Ser: 1.48 mg/dL — ABNORMAL HIGH (ref 0.50–1.10)
GFR calc Af Amer: 39 mL/min — ABNORMAL LOW (ref >90)
GFR calc Af Amer: 37 mL/min — ABNORMAL LOW (ref >90)
GFR, EST NON AFRICAN AMERICAN: 34 mL/min — AB (ref >90)
GFR, EST NON AFRICAN AMERICAN: 32 mL/min — AB (ref >90)
GLUCOSE: 171 mg/dL — AB (ref 70–99)
Glucose, Bld: 145 mg/dL — ABNORMAL HIGH (ref 70–99)
POTASSIUM: 3.6 mmol/L (ref 3.5–5.1)
POTASSIUM: 4.1 mmol/L (ref 3.5–5.1)
Sodium: 154 mmol/L — ABNORMAL HIGH (ref 135–145)
Sodium: 157 mmol/L — ABNORMAL HIGH (ref 135–145)

## 2014-06-20 LAB — HIV ANTIBODY (ROUTINE TESTING W REFLEX)
HIV 1/HIV 2 AB: NONREACTIVE
HIV 1/O/2 Abs-Index Value: <1 (ref <1.00)

## 2014-06-20 LAB — CSF CULTURE
CULTURE: NO GROWTH
GRAM STAIN: NONE SEEN

## 2014-06-20 LAB — CBC
HCT: 33.4 % — ABNORMAL LOW (ref 36.0–46.0)
Hemoglobin: 10 g/dL — ABNORMAL LOW (ref 12.0–15.0)
MCH: 30.9 pg (ref 26.0–34.0)
MCHC: 29.9 g/dL — AB (ref 30.0–36.0)
MCV: 103.1 fL — ABNORMAL HIGH (ref 78.0–100.0)
Platelets: 114 10*3/uL — ABNORMAL LOW (ref 150–400)
RBC: 3.24 MIL/uL — AB (ref 3.87–5.11)
RDW: 14 % (ref 11.5–15.5)
WBC: 9.7 10*3/uL (ref 4.0–10.5)

## 2014-06-20 LAB — GLUCOSE, CAPILLARY
GLUCOSE-CAPILLARY: 122 mg/dL — AB (ref 70–99)
GLUCOSE-CAPILLARY: 151 mg/dL — AB (ref 70–99)
GLUCOSE-CAPILLARY: 137 mg/dL — AB (ref 70–99)
GLUCOSE-CAPILLARY: 143 mg/dL — AB (ref 70–99)
Glucose-Capillary: 155 mg/dL — ABNORMAL HIGH (ref 70–99)

## 2014-06-20 LAB — HEPATITIS PANEL, ACUTE
HCV Ab: NEGATIVE
HEP B C IGM: NONREACTIVE
HEP B S AG: NEGATIVE
Hep A IgM: NONREACTIVE

## 2014-06-20 LAB — HEPATIC FUNCTION PANEL
ALT: 31 U/L (ref 0–35)
AST: 27 U/L (ref 0–37)
Albumin: 2.5 g/dL — ABNORMAL LOW (ref 3.5–5.2)
Alkaline Phosphatase: 52 U/L (ref 39–117)
Total Bilirubin: 0.7 mg/dL (ref 0.3–1.2)
Total Protein: 4.9 g/dL — ABNORMAL LOW (ref 6.0–8.3)

## 2014-06-20 LAB — MAGNESIUM: MAGNESIUM: 2 mg/dL (ref 1.5–2.5)

## 2014-06-20 LAB — VANCOMYCIN, TROUGH: Vancomycin Tr: 17.7 ug/mL (ref 10.0–20.0)

## 2014-06-20 LAB — CSF CULTURE W GRAM STAIN

## 2014-06-20 LAB — PHOSPHORUS: PHOSPHORUS: 2.2 mg/dL — AB (ref 2.3–4.6)

## 2014-06-20 MED ORDER — DEXTROSE 5 % IV SOLN
INTRAVENOUS | Status: AC
  Administered 2014-06-21 – 2014-06-22 (×3): via INTRAVENOUS

## 2014-06-20 MED ORDER — POTASSIUM CHLORIDE 20 MEQ/15ML (10%) PO SOLN
40.0000 meq | Freq: Once | ORAL | Status: AC
  Administered 2014-06-20: 40 meq via ORAL
  Filled 2014-06-20: qty 30

## 2014-06-20 MED ORDER — VITAL HIGH PROTEIN PO LIQD
1000.0000 mL | Freq: Every day | ORAL | Status: DC
  Filled 2014-06-20 (×2): qty 1000

## 2014-06-20 MED ORDER — SIMVASTATIN 10 MG PO TABS: 20.0000 mg | ORAL_TABLET | Freq: Every day | ORAL | Status: DC

## 2014-06-20 MED ORDER — SIMVASTATIN 20 MG PO TABS
20.0000 mg | ORAL_TABLET | Freq: Every day | ORAL | Status: DC
  Administered 2014-06-20 – 2014-06-24 (×5): 20 mg
  Filled 2014-06-20 (×2): qty 2
  Filled 2014-06-20: qty 1
  Filled 2014-06-20 (×3): qty 2
  Filled 2014-06-20: qty 1

## 2014-06-20 MED ORDER — ENOXAPARIN SODIUM 40 MG/0.4ML ~~LOC~~ SOLN
40.0000 mg | SUBCUTANEOUS | Status: DC
  Administered 2014-06-20 – 2014-06-24 (×4): 40 mg via SUBCUTANEOUS
  Filled 2014-06-20 (×6): qty 0.4

## 2014-06-20 NOTE — Progress Notes (Signed)
CRITICAL VALUE ALERT  Critical value received:  Cl- >130 Date of notification:  06/20/2014  Time of notification:  1731  Critical value read back:yes  Nurse who received alert:  Lorrin JacksonAmy Arcenia Scarbro RN  MD notified (1st page):  Dr. Dema SeverinMungal  Time of first page:  1733  MD notified (2nd page):n/a Time of second page:n/a  Responding MD:  Dr. Dema SeverinMungal  Time MD responded:  [phone]

## 2014-06-20 NOTE — Progress Notes (Signed)
[facility]    Subjective: Febrile again overnight   Antibiotics:  Anti-infectives    Start     Dose/Rate Route Frequency Ordered Stop   06/19/14 1630  metroNIDAZOLE (FLAGYL) IVPB 500 mg     500 mg100 mL/hr over 60 Minutes Intravenous 3 times per day 06/19/14 1557     06/18/14 2200  ceFEPIme (MAXIPIME) 1 g in dextrose 5 % 50 mL IVPB     1 g100 mL/hr over 30 Minutes Intravenous Every 24 hours 06/18/14 0800     06/18/14 1200  vancomycin (VANCOCIN) IVPB 750 mg/150 ml premix     750 mg150 mL/hr over 60 Minutes Intravenous Every 24 hours 06/18/14 0800     06/18/14 1100  acyclovir (ZOVIRAX) 650 mg in dextrose 5 % 100 mL IVPB  Status:  Discontinued     10 mg/kg  64.9 kg (Adjusted)113 mL/hr over 60 Minutes Intravenous Daily 06/18/14 0854 06/18/14 1022   06/18/14 1000  azithromycin (ZITHROMAX) 500 mg in dextrose 5 % 250 mL IVPB  Status:  Discontinued     500 mg250 mL/hr over 60 Minutes Intravenous Every 24 hours 06/18/14 0854 06/18/14 1022   06/17/14 1200  vancomycin (VANCOCIN) IVPB 1000 mg/200 mL premix  Status:  Discontinued     1,000 mg200 mL/hr over 60 Minutes Intravenous Every 24 hours 06/16/14 1236 06/18/14 0800   06/16/14 2200  ceFEPIme (MAXIPIME) 1 g in dextrose 5 % 50 mL IVPB  Status:  Discontinued     1 g100 mL/hr over 30 Minutes Intravenous Every 12 hours 06/16/14 1236 06/18/14 0800   06/16/14 1200  ceFEPIme (MAXIPIME) 2 g in dextrose 5 % 50 mL IVPB     2 g100 mL/hr over 30 Minutes Intravenous STAT 06/16/14 1146 06/16/14 1252   06/16/14 1200  vancomycin (VANCOCIN) 1,500 mg in sodium chloride 0.9 % 500 mL IVPB     1,500 mg250 mL/hr over 120 Minutes Intravenous STAT 06/16/14 1148 06/16/14 1434      Medications: Scheduled Meds: . acetylcysteine  3 mL Nebulization TID  . albuterol  2.5 mg Nebulization Q6H  . antiseptic oral rinse  7 mL Mouth Rinse q12n4p  . aspirin  81 mg Per Tube Daily  . ceFEPime (MAXIPIME) IV  1 g Intravenous Q24H  .  chlorhexidine  15 mL Mouth Rinse BID  . Chlorhexidine Gluconate Cloth  6 each Topical Q0600  . enoxaparin (LOVENOX) injection  40 mg Subcutaneous Q24H  . [START ON 06/21/2014] feeding supplement (VITAL HIGH PROTEIN)  1,000 mL Per Tube Daily  . free water  200 mL Per Tube Q6H  . insulin aspart  0-15 Units Subcutaneous 6 times per day  . levothyroxine  150 mcg Per Tube QAC breakfast  . metronidazole  500 mg Intravenous 3 times per day  . mupirocin ointment  1 application Nasal BID  . pantoprazole sodium  40 mg Per Tube Q24H  . simvastatin  20 mg Per Tube QHS  . sodium chloride  3 mL Intravenous Q12H  . thiamine  100 mg Per Tube Daily  . vancomycin  750 mg Intravenous Q24H   Continuous Infusions: . dextrose 75 mL/hr at 06/20/14 0935   PRN Meds:.acetaminophen (TYLENOL) oral liquid 160 mg/5 mL, albuterol, fentaNYL, [DISCONTINUED] ondansetron **OR** ondansetron (ZOFRAN) IV    Objective: Weight change: 12 lb 9.1 oz (5.7 kg)  Intake/Output Summary (Last 24 hours) at 06/20/14 1639 Last data filed at 06/20/14 1600  Gross per 24 hour  Intake  4290 ml  Output   3525 ml  Net    765 ml   Blood pressure 105/56, pulse 85, temperature 102.7 F (39.3 C), temperature source Oral, resp. rate 16, height 5\' 8"  (1.727 m), weight 174 lb 9.7 oz (79.2 kg), SpO2 98 %. Temp:  [97.8 F (36.6 C)-102.7 F (39.3 C)] 102.7 F (39.3 C) (01/17 1615) Pulse Rate:  [52-99] 85 (01/17 1600) Resp:  [10-22] 16 (01/17 1600) BP: (82-121)/(44-72) 105/56 mmHg (01/17 1600) SpO2:  [97 %-100 %] 98 % (01/17 1600) FiO2 (%):  [40 %] 40 % (01/17 1600) Weight:  [174 lb 9.7 oz (79.2 kg)] 174 lb 9.7 oz (79.2 kg) (01/17 0400)  Physical Exam: General: Alert and awake, mouthing words with ETT HEENT: anicteric sclera, , EOMI CVS regular rate, normal r,  no murmur rubs or gallops Chest: clear to auscultation bilaterally, no wheezing, rales or rhonchi Abdomen: soft nontender, nondistended, normal bowel sounds, Neuro:  nonfocal  CBC:  CBC Latest Ref Rng 06/20/2014 06/19/2014 06/18/2014  WBC 4.0 - 10.5 K/uL 9.7 14.2(H) 12.6(H)  Hemoglobin 12.0 - 15.0 g/dL 10.0(L) 11.3(L) 14.1  Hematocrit 36.0 - 46.0 % 33.4(L) 39.7 49.8(H)  Platelets 150 - 400 K/uL 114(L) 167 185      BMET  Recent Labs  06/19/14 2150 06/20/14 0350  NA 154* 154*  K 3.7 3.6  CL 130* 124*  CO2 23 23  GLUCOSE 174* 145*  BUN 33* 35*  CREATININE 1.61* 1.48*  CALCIUM 8.5 8.5     Liver Panel   Recent Labs  06/18/14 2211 06/20/14 0500  PROT 6.2 4.9*  ALBUMIN 3.1* 2.5*  AST 70* 27  ALT 56* 31  ALKPHOS 83 52  BILITOT 0.7 0.7  BILIDIR  --  <0.1  IBILI  --  NOT CALCULATED       Sedimentation Rate No results for input(s): ESRSEDRATE in the last 72 hours. C-Reactive Protein No results for input(s): CRP in the last 72 hours.  Micro Results: Recent Results (from the past 720 hour(s))  Urine culture     Status: None   Collection Time: 06/16/14 11:38 AM  Result Value Ref Range Status   Specimen Description URINE, CATHETERIZED  Final   Special Requests NONE  Final   Colony Count NO GROWTH Performed at [facility]   Final   Culture NO GROWTH Performed at [facility]   Final   Report Status 06/17/2014 FINAL  Final  Culture, blood (routine x 2)     Status: None (Preliminary result)   Collection Time: 06/16/14 11:53 AM  Result Value Ref Range Status   Specimen Description BLOOD RIGHT HAND  Final   Special Requests BOTTLES DRAWN AEROBIC AND ANAEROBIC 5CC  Final   Culture   Final           BLOOD CULTURE RECEIVED NO GROWTH TO DATE CULTURE WILL BE HELD FOR 5 DAYS BEFORE ISSUING A FINAL NEGATIVE REPORT Performed at [facility]    Report Status PENDING  Incomplete  Culture, blood (routine x 2)     Status: None (Preliminary result)   Collection Time: 06/16/14 11:53 AM  Result Value Ref Range Status   Specimen Description BLOOD LEFT HAND  Final   Special Requests BOTTLES DRAWN AEROBIC AND  ANAEROBIC 4CC  Final   Culture   Final           BLOOD CULTURE RECEIVED NO GROWTH TO DATE CULTURE WILL BE HELD FOR 5 DAYS BEFORE ISSUING A FINAL NEGATIVE  REPORT Performed at [facility]    Report Status PENDING  Incomplete  Gram stain     Status: None   Collection Time: 06/16/14  5:07 PM  Result Value Ref Range Status   Specimen Description CSF  Final   Special Requests NONE  Final   Gram Stain   Final    NO ORGANISMS SEEN NO WBC SEEN CYTOSPIN Gram Stain Report Called to,Read Back By and Verified With: ALDRIDGE,D. RN  ON 1.13.16 BY MCCOY,N.    Report Status 06/16/2014 FINAL  Final  CSF culture     Status: None   Collection Time: 06/16/14  5:07 PM  Result Value Ref Range Status   Specimen Description CSF  Final   Special Requests NONE  Final   Gram Stain   Final    NO WBC SEEN NO ORGANISMS SEEN Performed at [facility]    Culture   Final    NO GROWTH 3 DAYS Performed at [facility]    Report Status 06/20/2014 FINAL  Final  MRSA PCR Screening     Status: Abnormal   Collection Time: 06/16/14  5:19 PM  Result Value Ref Range Status   MRSA by PCR POSITIVE (A) NEGATIVE Final    Comment:        The GeneXpert MRSA Assay (FDA approved for NASAL specimens only), is one component of a comprehensive MRSA colonization surveillance program. It is not intended to diagnose MRSA infection nor to guide or monitor treatment for MRSA infections. RESULT CALLED TO, READ BACK BY AND VERIFIED WITH: C.DENNY,RN AT 2357 ON 06/16/14 BY SHEA.W   Culture, respiratory (NON-Expectorated)     Status: None (Preliminary result)   Collection Time: 06/19/14 10:16 AM  Result Value Ref Range Status   Specimen Description ENDOTRACHEAL  Final   Special Requests NONE  Final   Gram Stain PENDING  Incomplete   Culture   Final    NO GROWTH 1 DAY Performed at [facility]    Report Status PENDING  Incomplete    Studies/Results: Dg Chest Port 1  View  06/20/2014   CLINICAL DATA:  Subsequent encounter for aspiration pneumonitis.  EXAM: PORTABLE CHEST - 1 VIEW  COMPARISON:  1 day prior  FINDINGS: Support apparatus: Endotracheal tube terminates 2.3 cm above carina. Left internal jugular line terminates at low SVC. Nasogastric tube extends beyond the inferior aspect of the film.  Cardiomediastinal silhouette: Patient rotated right. Cardiomegaly with tortuous thoracic aorta.  Pleura:  No pleural effusion or pneumothorax.  Lungs: No congestive failure. Improved left side aeration with mild left base atelectasis remaining. Moderate right hemidiaphragm elevation.  Other: None  IMPRESSION: Improved left side aeration with mild atelectasis remaining.  Cardiomegaly without congestive failure.   Electronically Signed   By: Jeronimo Greaves M.D.   On: 06/20/2014 07:18   Dg Chest Port 1 View  06/19/2014   CLINICAL DATA:  Central line placement.  Initial encounter.  EXAM: PORTABLE CHEST - 1 VIEW  COMPARISON:  Chest radiograph performed 06/18/2014  FINDINGS: The patient's left IJ line is seen ending about the mid to distal SVC. The endotracheal tube is seen ending 2-3 cm above the carina. The enteric tube is noted extending below the diaphragm.  Mild left midlung and left basilar airspace opacity raises concern for mild pneumonia. Minimal right basilar opacity is seen. No pleural effusion or pneumothorax is seen; the left costophrenic angle is incompletely imaged on the study.  The cardiomediastinal silhouette remains normal in  size. No acute osseous abnormalities are identified.  IMPRESSION: 1. Left IJ line seen ending about the mid to distal SVC. 2. Mild left midlung and left basilar airspace opacity raises concern for mild pneumonia, more defined than on the prior study. Minimal right basilar opacity also seen.   Electronically Signed   By: Roanna RaiderJeffery  Chang M.D.   On: 06/19/2014 03:41   Dg Chest Port 1 View  06/18/2014   CLINICAL DATA:  Endotracheal tube placement.  Recent CPR, with respiratory distress. Initial encounter.  EXAM: PORTABLE CHEST - 1 VIEW  COMPARISON:  CT of the chest performed 06/17/2014, and chest radiograph performed 06/16/2014  FINDINGS: The patient's endotracheal tube is seen ending 3 cm above the carina.  There is mild elevation of the left hemidiaphragm. Left basilar airspace opacification may reflect atelectasis or possibly pneumonia. Given recent CPR, aspiration cannot be excluded. The right lung appears clear. No definite pleural effusion or pneumothorax is seen.  The cardiomediastinal silhouette remains borderline normal in size. No acute osseous abnormalities are seen.  IMPRESSION: 1. Endotracheal tube seen ending 3 cm above the carina. 2. Mild elevation of the left hemidiaphragm. Left basilar airspace opacification may reflect atelectasis or possibly pneumonia. Given the recent history, aspiration cannot be excluded.   Electronically Signed   By: Roanna RaiderJeffery  Chang M.D.   On: 06/18/2014 22:16   Dg Abd Portable 1v  06/19/2014   CLINICAL DATA:  OG tube placement.  EXAM: PORTABLE ABDOMEN - 1 VIEW  COMPARISON:  CT earlier this day.  FINDINGS: The tip and side port of the enteric tube are below the diaphragm in the stomach. There is a nonobstructive bowel gas pattern. No evidence of free air.  IMPRESSION: Tip and side port of the enteric tube below the diaphragm in the stomach.   Electronically Signed   By: Rubye OaksMelanie  Ehinger M.D.   On: 06/19/2014 02:43      Assessment/Plan:  Principal Problem:   Fever Active Problems:   Bipolar 1 disorder   Essential hypertension   Hypothyroid   Acute kidney injury   Acute encephalopathy   Hypernatremia   Dehydration   Sepsis   Elevated troponin   Transaminitis   Macrocytosis without anemia   Atherosclerotic peripheral vascular disease   Altered mental state   Aspiration pneumonitis   FUO (fever of unknown origin)    [name] [name] is a 74 y.o. female admittted from SNF found unresponsive, febrile  worked up extensively for source of fever including CT chest abdomen, pelvis, CT head, MRI head, LP blood cultures, urine cultures, who had been vanco/cefepime.acyclovir, azithromycin, seen by my partner Dr. Orvan Falconerampbell. Pt had profound hypernatremia. My partner Dr. Orvan Falconerampbell actually had suspicion for possible Li toxicity (which she had in the past) vs Neurleptic malignant syndrome. In the interim she has had what sounds to be an aspiration event with bradyarrrhythmia now intubated, and found to have copious secretions suctioned  #1 HCAP: --continue vanco cefepime, add flagyl, tracheal cultures are NGTD   #2 FUO: this was brewing before her aspiration event. There is actually no evidence for any definite process that required antimicrobial rx priort to intubation. Dr Orvan Falconerampbell had suspicion for for NMS. Would avoid drugs that might cause this , worsen this and continue aggressive critical care  Dr. Orvan Falconerampbell back tomorrow.   LOS: 4 days   Acey LavCornelius Van Dam 06/20/2014, 4:39 PM

## 2014-06-20 NOTE — Progress Notes (Signed)
eLink Physician-Brief Progress Note Patient Name: [name] [name] DOB: [birth year] MRN: [id number]   Date of Service  06/20/2014  HPI/Events of Note  Pt. With fever (102.7) on vanc\cefpime  eICU Interventions  Recheck blood culture (last set was 06/16/13)     Intervention Category Intermediate Interventions: Other:  Lucienne Sawyers 06/20/2014, 4:30 PM

## 2014-06-20 NOTE — Progress Notes (Signed)
Name: [name] [name] MRN: [id number] DOB: [birth year]    ADMISSION DATE:  06/16/2014  REFERRING MD :  Dr. Sanjuana Letters  CHIEF COMPLAINT:  AMS / Fever   BRIEF PATIENT DESCRIPTION:  74 yo female from SNF with fever (Tm 102F) and altered mental status.  Transferred to ICU with symptomatic bradycardia, VDRF.  SIGNIFICANT EVENTS  1/13  Admit with fever / AMS 1/15  ID consulted 1/15  Bradycardic arrest likely from respiratory failure, VDRF, to ICU  STUDIES:  1/13  CT Head >>  Negative  1/13  LP >> RBC 39, WBC 1 1/14  CT chest >> atherosclerosis 1/14  CT abd/pelvis >> CBD 10 mm, diverticulosis 1/15  MRI brain >> mild atrophy and small vessel disease 1/15  Echo >> mild LVH, EF 65 to 63%, grade 1 diastolic dysfx  SUBJECTIVE:  More alert.  VITAL SIGNS: Temp:  [97.8 F (36.6 C)-99.5 F (37.5 C)] 99.5 F (37.5 C) (01/17 0800) Pulse Rate:  [52-90] 72 (01/17 0900) Resp:  [18-27] 18 (01/17 0900) BP: (88-135)/(44-92) 110/58 mmHg (01/17 0900) SpO2:  [95 %-100 %] 98 % (01/17 0900) FiO2 (%):  [40 %] 40 % (01/17 0815) Weight:  [174 lb 9.7 oz (79.2 kg)] 174 lb 9.7 oz (79.2 kg) (01/17 0400)  PHYSICAL EXAMINATION: General: no distress Neuro: RASS 0, follows commands, moves all extremities HEENT:  ET tube in place Cardiovascular: regular, no murmur Lungs: scattered rhonchi Abdomen: soft, non tender Musculoskeletal: no edema Skin: no rashes    CBC Latest Ref Rng 06/20/2014 06/19/2014 06/18/2014  WBC 4.0 - 10.5 K/uL 9.7 14.2(H) 12.6(H)  Hemoglobin 12.0 - 15.0 g/dL 10.0(L) 11.3(L) 14.1  Hematocrit 36.0 - 46.0 % 33.4(L) 39.7 49.8(H)  Platelets 150 - 400 K/uL 114(L) 167 185    BMP Latest Ref Rng 06/20/2014 06/19/2014 06/19/2014  Glucose 70 - 99 mg/dL 145(H) 174(H) 143(H)  BUN 6 - 23 mg/dL 35(H) 33(H) 31(H)  Creatinine 0.50 - 1.10 mg/dL 1.48(H) 1.61(H) 1.72(H)  Sodium 135 - 145 mmol/L 154(H) 154(H) 155(H)  Potassium 3.5 - 5.1 mmol/L 3.6 3.7 4.2  Chloride 96 - 112 mEq/L 124(H) 130(H) 130(H)    CO2 19 - 32 mmol/L 23 23 23   Calcium 8.4 - 10.5 mg/dL 8.5 8.5 8.5    Hepatic Function Latest Ref Rng 06/20/2014 06/18/2014 06/18/2014  Total Protein 6.0 - 8.3 g/dL 4.9(L) 6.2 6.6  Albumin 3.5 - 5.2 g/dL 2.5(L) 3.1(L) 3.5  AST 0 - 37 U/L 27 70(H) 35  ALT 0 - 35 U/L 31 56(H) 37(H)  Alk Phosphatase 39 - 117 U/L 52 83 88  Total Bilirubin 0.3 - 1.2 mg/dL 0.7 0.7 0.5  Bilirubin, Direct 0.0 - 0.3 mg/dL <0.1 - -    CBG (last 3)   Recent Labs  06/20/14 0013 06/20/14 0434 06/20/14 0751  GLUCAP 155* 122* 151*     ABG    Component Value Date/Time   PHART 7.398 06/19/2014 0432   PCO2ART 33.2* 06/19/2014 0432   PO2ART 97.0 06/19/2014 0432   HCO3 19.9* 06/19/2014 0432   TCO2 18.2 06/19/2014 0432   ACIDBASEDEF 3.6* 06/19/2014 0432   O2SAT 98.0 06/19/2014 0432    Mr Brain Wo Contrast  06/18/2014   CLINICAL DATA:  Possible CVA. Encephalopathy. Unresponsive. Fever. Acute mental status decline over the past 3 days.  EXAM: MRI HEAD WITHOUT CONTRAST  TECHNIQUE: Multiplanar, multiecho pulse sequences of the brain and surrounding structures were obtained without intravenous contrast.  COMPARISON:  CT head 06/16/2014.  FINDINGS: No evidence for acute  infarction, hemorrhage, mass lesion, hydrocephalus, or extra-axial fluid. Generalized atrophy. Mild subcortical and periventricular T2 and FLAIR hyperintensities, likely chronic microvascular ischemic change. Flow voids are maintained throughout the carotid, basilar, and vertebral arteries. There are no areas of chronic hemorrhage. Pituitary, pineal, and cerebellar tonsils unremarkable. No upper cervical lesions. Visualized calvarium, skull base, and upper cervical osseous structures unremarkable. Scalp and extracranial soft tissues, orbits, sinuses, and mastoids show no acute process.  IMPRESSION: Mild atrophy and small vessel disease. No acute intracranial abnormality.   Electronically Signed   By: Rolla Flatten M.D.   On: 06/18/2014 12:14   Dg Chest Port  1 View  06/20/2014   CLINICAL DATA:  Subsequent encounter for aspiration pneumonitis.  EXAM: PORTABLE CHEST - 1 VIEW  COMPARISON:  1 day prior  FINDINGS: Support apparatus: Endotracheal tube terminates 2.3 cm above carina. Left internal jugular line terminates at low SVC. Nasogastric tube extends beyond the inferior aspect of the film.  Cardiomediastinal silhouette: Patient rotated right. Cardiomegaly with tortuous thoracic aorta.  Pleura:  No pleural effusion or pneumothorax.  Lungs: No congestive failure. Improved left side aeration with mild left base atelectasis remaining. Moderate right hemidiaphragm elevation.  Other: None  IMPRESSION: Improved left side aeration with mild atelectasis remaining.  Cardiomegaly without congestive failure.   Electronically Signed   By: Abigail Miyamoto M.D.   On: 06/20/2014 07:18   Dg Chest Port 1 View  06/19/2014   CLINICAL DATA:  Central line placement.  Initial encounter.  EXAM: PORTABLE CHEST - 1 VIEW  COMPARISON:  Chest radiograph performed 06/18/2014  FINDINGS: The patient's left IJ line is seen ending about the mid to distal SVC. The endotracheal tube is seen ending 2-3 cm above the carina. The enteric tube is noted extending below the diaphragm.  Mild left midlung and left basilar airspace opacity raises concern for mild pneumonia. Minimal right basilar opacity is seen. No pleural effusion or pneumothorax is seen; the left costophrenic angle is incompletely imaged on the study.  The cardiomediastinal silhouette remains normal in size. No acute osseous abnormalities are identified.  IMPRESSION: 1. Left IJ line seen ending about the mid to distal SVC. 2. Mild left midlung and left basilar airspace opacity raises concern for mild pneumonia, more defined than on the prior study. Minimal right basilar opacity also seen.   Electronically Signed   By: Garald Balding M.D.   On: 06/19/2014 03:41   Dg Chest Port 1 View  06/18/2014   CLINICAL DATA:  Endotracheal tube placement.  Recent CPR, with respiratory distress. Initial encounter.  EXAM: PORTABLE CHEST - 1 VIEW  COMPARISON:  CT of the chest performed 06/17/2014, and chest radiograph performed 06/16/2014  FINDINGS: The patient's endotracheal tube is seen ending 3 cm above the carina.  There is mild elevation of the left hemidiaphragm. Left basilar airspace opacification may reflect atelectasis or possibly pneumonia. Given recent CPR, aspiration cannot be excluded. The right lung appears clear. No definite pleural effusion or pneumothorax is seen.  The cardiomediastinal silhouette remains borderline normal in size. No acute osseous abnormalities are seen.  IMPRESSION: 1. Endotracheal tube seen ending 3 cm above the carina. 2. Mild elevation of the left hemidiaphragm. Left basilar airspace opacification may reflect atelectasis or possibly pneumonia. Given the recent history, aspiration cannot be excluded.   Electronically Signed   By: Garald Balding M.D.   On: 06/18/2014 22:16   Dg Abd Portable 1v  06/19/2014   CLINICAL DATA:  OG tube placement.  EXAM: PORTABLE  ABDOMEN - 1 VIEW  COMPARISON:  CT earlier this day.  FINDINGS: The tip and side port of the enteric tube are below the diaphragm in the stomach. There is a nonobstructive bowel gas pattern. No evidence of free air.  IMPRESSION: Tip and side port of the enteric tube below the diaphragm in the stomach.   Electronically Signed   By: Jeb Levering M.D.   On: 06/19/2014 02:43        ASSESSMENT / PLAN:  PULMONARY ETT 1/15 >>  A: Acute respiratory failure 2nd to recurrent aspiration PNA, bradycardia. P: Pressure support wean as tolerated >> not ready for extubation yet F/u CXR Continue schedule BD's, mucomyst, chest vibration for now  CARDIAC Lt IJ CVL 1/15 >> A: Symptomatic bradycardia >> likely from respiratory failure. Elevated troponin >> likely demand ischemia. Hx of HTN. Chest pain 1/17 >> likely from chest wall after CPR 1/15. P: Monitor  hemodynamics Continue ASA Resume zocor 1/17 Hold outpt norvasc for now  RENAL A: Hypernatremia >> improving. AKI 2nd to volume depletion. P: Continue IV fluid to D5W at 75 ml/hr Continue free water at 200 ml qh6h F/u BMET  GASTROENTEROLOGY A: Dysphagia. Nutriton. P: Added tube feeds 1/16 Protonix for SUP Will need speech assessment after extubation  HEMATOLOGY A: Mild anemia. Thrombocytopenia. P: F/u CBC Lovenox for DVT prevention  INFECTION A: Sepsis 2nd to recurrent aspiration PNA. P: Day 5 vancomycin, cefepime Day 2 flagyl Abx per ID  MRSA 1/13 >> POSITIVE Blood 1/13 >> CSF 1/13 >> Sputum 1/16 >>  ENDOCRINE A: Hx of hypothyroidism. Hyperglycemia. P: Continue levothyroxine SSI  NEUROLOGY A: Acute encephalopathy 2nd to sepsis, renal failure, hypernatremia >> improved 1/17. Hx of Bipolar 1 disorder. P: RASS goal 0, PAD 1 protocol Hold outpt cogentin, tegretol, lithium, restoril, tramadol Will need psych evaluation once more stable to assist with bipolar regimen  Summary: She likely had recurrent aspiration PNA leading to respiratory and then cardiac arrest.  Updated pt's daughter-in-law Almyra Free at bedside (she is former ICU nurse and now works as Immunologist at [employer]).  CC time 35 minute.  Chesley Mires, MD [facility] Pul[facility]Critical [facility] 06/20/2014, 9:19 AM Pager:  [phone] After 3pm call: [phone]

## 2014-06-20 NOTE — Progress Notes (Signed)
ANTIBIOTIC CONSULT NOTE - follow up  Pharmacy Consult for cefepime, vancomycin Indication: HCAP  Allergies  Allergen Reactions  . Antihistamine Decongestant [Triprolidine-Pse] Diarrhea  . Codeine Other (See Comments)    Gi upset     Patient Measurements: Height: 5\' 8"  (172.7 cm) Weight: 174 lb 9.7 oz (79.2 kg) IBW/kg (Calculated) : 63.9   Vital Signs: Temp: 99.5 F (37.5 C) (01/17 0800) Temp Source: Axillary (01/17 0800) BP: 120/65 mmHg (01/17 1000) Pulse Rate: 79 (01/17 1000) Intake/Output from previous day: 01/16 0701 - 01/17 0700 In: 4160 [I.V.:2130; NG/GT:1530; IV Piggyback:500] Out: 2625 [Urine:2625] Intake/Output from this shift: Total I/O In: 860 [I.V.:490; NG/GT:370] Out: 650 [Urine:650]  Labs:  Recent Labs  06/18/14 2211 06/19/14 0400  06/19/14 1550 06/19/14 2150 06/20/14 0350 06/20/14 0500  WBC 12.6* 14.2*  --   --   --   --  9.7  HGB 14.1 11.3*  --   --   --   --  10.0*  PLT 185 167  --   --   --   --  114*  CREATININE 2.26* 1.94*  < > 1.72* 1.61* 1.48*  --   < > = values in this interval not displayed. Estimated Creatinine Clearance: 36.9 mL/min (by C-G formula based on Cr of 1.48).  Recent Labs  06/20/14 1000  VANCOTROUGH 17.7      Medical History: Past Medical History  Diagnosis Date  . Hypertension   . Bipolar 1 disorder   . Hypothyroidism   . Memory difficulties   . HLD (hyperlipidemia)   . Diverticulitis large intestine   . Pneumonia    Microbiology: 1/13 blood x 2: NGTD 1/13 CSF: NGF  1/13 urine: NGF 1/13 flu panel: neg 1/13 MRSA PCR screen: positive 1/16 Sputum: NGtd   Anti-infectives: 1/13 >>cefepime>> 1/13 >>vancomycin>> 1/16 >>metronidazole >>  Serum levels and dosage adjustments: 1/15: Vancomycin reduced from 1g q24h to 750mg  q24h and cefepime from 1g q12h to 1 gram q24h based upon renal function 1/17: VT 17.7 on 750mg  q24h - continue present dosage.  Assessment: 74 y/o F admitted through ED from rehab  facility 1/13 with fever and AMS.  CXR showed unchanged mild subsegmental atelectasis or scar RLL.  Orders received to begin cefepime and vancomycin with pharmacy dosing assistance for r/o HCAP.   Azotemia noted.  Per [facility] records, baseline SCr ~ 1.2.  Today, 06/20/14: D#5 cefepime 1 gram IV q24h D#5 vancomycin 750 mg IV q24h D#2 metronidazole 500 mg IV q8h Fever resolved Leukocytosis resolved Serum creatinine slowly improving Vancomycin trough therapeutic   Goal of Therapy:  Appropriate antibiotic dosing for indication and renal function; eradication of infection. Vancomycin trough 15-20  Plan: 1. Continue vancomycin 750mg  IV q24h 2. Continue cefepime 1gm q24h 3. Continue metronidazole as ordered by ID (500mg  IV q8h) 4. Follow serum creatinine, cultures, clinical course.  Elie Goodyandy Zyia Kaneko, PharmD, BCPS Pager: [phone] 06/20/2014  12:25 PM

## 2014-06-20 NOTE — Progress Notes (Signed)
INITIAL NUTRITION ASSESSMENT  DOCUMENTATION CODES Per approved criteria  -Not Applicable   INTERVENTION: Continue Vital HP @ 40 ml/hr via OGT and increase by 10 ml every 4 hours to goal rate of 60 ml/hr.     Tube feeding regimen provides 1440 kcal (90% of needs), 126 grams of protein (101% of needs), and 1204 ml of H2O.   RD to continue to monitor  NUTRITION DIAGNOSIS: Inadequate oral intake related to inability to eat as evidenced by NPO status.   Goal: Pt to meet >/= 90% of their estimated nutrition needs   Monitor:  TF regimen & tolerance, respiratory status, weight, labs, I/O's  Reason for Assessment: Consult for TF management   Admitting Dx: Fever  ASSESSMENT: 74 y.o. female who lives in a skilled nursing facility and has a history of hypertension, bipolar disorder, hypothyroidism, who was the in this hospital back in November and was treated for pneumonia. In the emergency department she was found to have a rectal temperature of 102F.   Pt in room with no family present.  Patient is currently intubated on ventilator support d/t an aspiration event with bradyarrhythmia MV: 8.3 L/min Temp (24hrs), Avg:98.7 F (37.1 C), Min:97.8 F (36.6 C), Max:99.5 F (37.5 C)  Propofol: none  Pt currently receiving VHP at 68ml/hr, providing 960 kcal, 84 g protein and 803 ml of H20.  Nutrition focused physical exam shows no sign of depletion of muscle mass or body fat.  Labs reviewed: Elevated Na, BUN & Creatinine Phos low Mg WNL  Height: Ht Readings from Last 1 Encounters:  06/18/14  (1.727 m)    Weight: Wt Readings from Last 1 Encounters:  06/20/14 174 lb 9.7 oz (79.2 kg)    Ideal Body Weight: 140 lb  % Ideal Body Weight: 124%  Wt Readings from Last 10 Encounters:  06/20/14 174 lb 9.7 oz (79.2 kg)  04/26/14 171 lb 4.8 oz (77.7 kg)    Usual Body Weight: unknown  % Usual Body Weight: NA  BMI:  Body mass index is 26.55 kg/(m^2).  Estimated  Nutritional Needs: Kcal: 1601 Protein: 115-125g Fluid: 1.6L/day  Skin: Intact, +1 RLE, +1 LLE edema  Diet Order: Diet NPO time specified  EDUCATION NEEDS: -No education needs identified at this time   Intake/Output Summary (Last 24 hours) at 06/20/14 0910 Last data filed at 06/20/14 0900  Gross per 24 hour  Intake   4140 ml  Output   2825 ml  Net   1315 ml    Last BM: PTA  Labs:   Recent Labs Lab 06/18/14 0508  06/19/14 0400  06/19/14 1550 06/19/14 2150 06/20/14 0350 06/20/14 0500  NA 168*  < > 150*  < > 155* 154* 154*  --   K 3.8  < > 3.2*  < > 4.2 3.7 3.6  --   CL >130*  < > 127*  < > 130* 130* 124*  --   CO2 25  < > 23  < > --   BUN 30*  < > 33*  < > 31* 33* 35*  --   CREATININE 1.98*  < > 1.94*  < > 1.72* 1.61* 1.48*  --   CALCIUM 10.1  < > 8.3*  < > 8.5 8.5 8.5  --   MG 2.6*  --  2.2  --   --   --   --  2.0  PHOS 3.3  --   --   --   --   --   --  2.2*  GLUCOSE 162*  < > 206*  < > 143* 174* 145*  --   < > = values in this interval not displayed.  CBG (last 3)   Recent Labs  06/20/14 0013 06/20/14 0434 06/20/14 0751  GLUCAP 155* 122* 151*    Scheduled Meds: . acetylcysteine  3 mL Nebulization TID  . albuterol  2.5 mg Nebulization Q6H  . antiseptic oral rinse  7 mL Mouth Rinse q12n4p  . aspirin  81 mg Per Tube Daily  . ceFEPime (MAXIPIME) IV  1 g Intravenous Q24H  . chlorhexidine  15 mL Mouth Rinse BID  . Chlorhexidine Gluconate Cloth  6 each Topical Q0600  . enoxaparin (LOVENOX) injection  30 mg Subcutaneous Q24H  . feeding supplement (VITAL HIGH PROTEIN)  1,000 mL Per Tube Daily  . free water  200 mL Per Tube Q6H  . insulin aspart  0-15 Units Subcutaneous 6 times per day  . levothyroxine  150 mcg Per Tube QAC breakfast  . metronidazole  500 mg Intravenous 3 times per day  . mupirocin ointment  1 application Nasal BID  . pantoprazole sodium  40 mg Per Tube Q24H  . sodium chloride  3 mL Intravenous Q12H  . thiamine  100 mg Per Tube  Daily  . vancomycin  750 mg Intravenous Q24H    Continuous Infusions: . norepinephrine (LEVOPHED) Adult infusion      Past Medical History  Diagnosis Date  . Hypertension   . Bipolar 1 disorder   . Hypothyroidism   . Memory difficulties   . HLD (hyperlipidemia)   . Diverticulitis large intestine   . Pneumonia     Past Surgical History  Procedure Laterality Date  . No past surgeries      [name] FrancoLindsey [name], MS, RD, LDN Pager: [phone] After Hours Pager: [phone]

## 2014-06-21 ENCOUNTER — Inpatient Hospital Stay (HOSPITAL_COMMUNITY): Payer: Medicare Other

## 2014-06-21 DIAGNOSIS — R509 Fever, unspecified: Secondary | ICD-10-CM

## 2014-06-21 LAB — CARBAMAZEPINE, FREE AND TOTAL
CARBAMAZEPINE METABOLITE -: 1.3 ug/mL (ref 0.2–2.0)
CARBAMAZEPINE, TOTAL: 1.6 ug/mL — AB (ref 4.0–12.0)
Carbamazepine Metabolite/: 0.7 ug/mL
Carbamazepine Metabolite: 0.6 ug/mL (ref 0.1–1.0)
Carbamazepine, Bound: 1.6 ug/mL

## 2014-06-21 LAB — BASIC METABOLIC PANEL
Anion gap: 6 (ref 5–15)
BUN: 37 mg/dL — ABNORMAL HIGH (ref 6–23)
CO2: 25 mmol/L (ref 19–32)
Calcium: 8.9 mg/dL (ref 8.4–10.5)
Chloride: 128 mEq/L — ABNORMAL HIGH (ref 96–112)
Creatinine, Ser: 1.37 mg/dL — ABNORMAL HIGH (ref 0.50–1.10)
GFR calc Af Amer: 43 mL/min — ABNORMAL LOW (ref >90)
GFR, EST NON AFRICAN AMERICAN: 37 mL/min — AB (ref >90)
GLUCOSE: 149 mg/dL — AB (ref 70–99)
POTASSIUM: 3.7 mmol/L (ref 3.5–5.1)
Sodium: 159 mmol/L — ABNORMAL HIGH (ref 135–145)

## 2014-06-21 LAB — CBC
HCT: 33.4 % — ABNORMAL LOW (ref 36.0–46.0)
HEMOGLOBIN: 10 g/dL — AB (ref 12.0–15.0)
MCH: 31.1 pg (ref 26.0–34.0)
MCHC: 29.9 g/dL — AB (ref 30.0–36.0)
MCV: 103.7 fL — ABNORMAL HIGH (ref 78.0–100.0)
Platelets: 100 10*3/uL — ABNORMAL LOW (ref 150–400)
RBC: 3.22 MIL/uL — ABNORMAL LOW (ref 3.87–5.11)
RDW: 14.4 % (ref 11.5–15.5)
WBC: 9.5 10*3/uL (ref 4.0–10.5)

## 2014-06-21 LAB — GLUCOSE, CAPILLARY
GLUCOSE-CAPILLARY: 119 mg/dL — AB (ref 70–99)
GLUCOSE-CAPILLARY: 119 mg/dL — AB (ref 70–99)
GLUCOSE-CAPILLARY: 131 mg/dL — AB (ref 70–99)
GLUCOSE-CAPILLARY: 181 mg/dL — AB (ref 70–99)
Glucose-Capillary: 152 mg/dL — ABNORMAL HIGH (ref 70–99)
Glucose-Capillary: 246 mg/dL — ABNORMAL HIGH (ref 70–99)
Glucose-Capillary: 239 mg/dL — ABNORMAL HIGH (ref 70–99)

## 2014-06-21 LAB — CULTURE, RESPIRATORY W GRAM STAIN: Gram Stain: NONE SEEN

## 2014-06-21 LAB — HIV ANTIBODY (ROUTINE TESTING W REFLEX): HIV 1/HIV 2 AB: NONREACTIVE

## 2014-06-21 LAB — CULTURE, RESPIRATORY

## 2014-06-21 MED ORDER — FREE WATER
200.0000 mL | Freq: Four times a day (QID) | Status: DC
  Administered 2014-06-21 – 2014-06-23 (×6): 200 mL

## 2014-06-21 MED ORDER — FENTANYL CITRATE 0.05 MG/ML IJ SOLN
50.0000 ug | INTRAMUSCULAR | Status: DC | PRN
  Administered 2014-06-21 – 2014-06-22 (×3): 50 ug via INTRAVENOUS
  Filled 2014-06-21 (×3): qty 2

## 2014-06-21 MED ORDER — VITAL HIGH PROTEIN PO LIQD
1000.0000 mL | ORAL | Status: DC
  Administered 2014-06-21: 1000 mL
  Filled 2014-06-21 (×3): qty 1000

## 2014-06-21 MED ORDER — DEXAMETHASONE SODIUM PHOSPHATE 4 MG/ML IJ SOLN
6.0000 mg | Freq: Two times a day (BID) | INTRAMUSCULAR | Status: AC
  Administered 2014-06-21 (×2): 6 mg via INTRAVENOUS
  Filled 2014-06-21 (×2): qty 2

## 2014-06-21 MED ORDER — FENTANYL CITRATE 0.05 MG/ML IJ SOLN: 12.5000 ug | INTRAMUSCULAR | Status: DC | PRN

## 2014-06-21 MED ORDER — FENTANYL CITRATE 0.05 MG/ML IJ SOLN: 50.0000 ug | INTRAMUSCULAR | Status: DC | PRN

## 2014-06-21 NOTE — Progress Notes (Signed)
[facility] P[facility]M  No cuff leak Hold extubation Decadron overnight  Heber CarolinaBrent McQuaid, MD [place] [facility] Pager: [phone] Cell: [phone] If no response, call [phone]

## 2014-06-21 NOTE — Progress Notes (Signed)
CSW continuing to follow.  [facility].   CSW reviewed chart and spoke with RN, pt currently on vent.   CSW to continue to follow pt progress and assist with disposition as appropriate.  Loletta SpecterSuzanna Kidd, MSW, LCSW Clinical Social Work [phone]

## 2014-06-21 NOTE — Progress Notes (Signed)
Chest Vest completed ( ) at approximately 0800- tolerated well- uneventful.

## 2014-06-21 NOTE — Progress Notes (Signed)
Patient ID: [name] [name], female   DOB: 1940/11/01, 74 y.o.   MRN: [id number]         [facility]    Date of Admission:  06/16/2014           Day 6 vancomycin        Day 6 cefepime        Day 2 metronidazole  Principal Problem:   Fever Active Problems:   Acute kidney injury   Acute encephalopathy   Hypernatremia   Dehydration   Sepsis   Elevated troponin   Transaminitis   Bipolar 1 disorder   Essential hypertension   Hypothyroid   Macrocytosis without anemia   Atherosclerotic peripheral vascular disease   Altered mental state   Aspiration pneumonitis   FUO (fever of unknown origin)   Encephalopathy   . acetylcysteine  3 mL Nebulization TID  . albuterol  2.5 mg Nebulization Q6H  . antiseptic oral rinse  7 mL Mouth Rinse q12n4p  . aspirin  81 mg Per Tube Daily  . ceFEPime (MAXIPIME) IV  1 g Intravenous Q24H  . chlorhexidine  15 mL Mouth Rinse BID  . Chlorhexidine Gluconate Cloth  6 each Topical Q0600  . dexamethasone  6 mg Intravenous Q12H  . enoxaparin (LOVENOX) injection  40 mg Subcutaneous Q24H  . free water  200 mL Per Tube Q6H  . insulin aspart  0-15 Units Subcutaneous 6 times per day  . levothyroxine  150 mcg Per Tube QAC breakfast  . metronidazole  500 mg Intravenous 3 times per day  . pantoprazole sodium  40 mg Per Tube Q24H  . simvastatin  20 mg Per Tube QHS  . sodium chloride  3 mL Intravenous Q12H  . thiamine  100 mg Per Tube Daily  . vancomycin  750 mg Intravenous Q24H    OBJECTIVE: Blood pressure 120/59, pulse 80, temperature 100.7 F (38.2 C), temperature source Axillary, resp. rate 15, height  (1.727 m), weight 170 lb 10.2 oz (77.4 kg), SpO2 99 %.  General: She is more alert and attempting to speak but she remains elevated Skin: No rash Lungs: Few right base crackles anteriorly Cor: Regular S1 and S2 with an early 1/6 systolic murmur Abdomen: Soft and not obviously tender  Lab Results Lab Results  Component  Value Date   WBC 9.5 06/21/2014   HGB 10.0* 06/21/2014   HCT 33.4* 06/21/2014   MCV 103.7* 06/21/2014   PLT 100* 06/21/2014    Lab Results  Component Value Date   CREATININE 1.37* 06/21/2014   BUN 37* 06/21/2014   NA 159* 06/21/2014   K 3.7 06/21/2014   CL 128* 06/21/2014   CO2 25 06/21/2014    Lab Results  Component Value Date   ALT 31 06/20/2014   AST 27 06/20/2014   ALKPHOS 52 06/20/2014   BILITOT 0.7 06/20/2014     Microbiology: Recent Results (from the past 240 hour(s))  Urine culture     Status: None   Collection Time: 06/16/14 11:38 AM  Result Value Ref Range Status   Specimen Description URINE, CATHETERIZED  Final   Special Requests NONE  Final   Colony Count NO GROWTH Performed at [facility]   Final   Culture NO GROWTH Performed at [facility]   Final   Report Status 06/17/2014 FINAL  Final  Culture, blood (routine x 2)     Status: None (Preliminary result)   Collection Time: 06/16/14 11:53 AM  Result Value Ref Range Status   Specimen Description BLOOD RIGHT HAND  Final   Special Requests BOTTLES DRAWN AEROBIC AND ANAEROBIC 5CC  Final   Culture   Final           BLOOD CULTURE RECEIVED NO GROWTH TO DATE CULTURE WILL BE HELD FOR 5 DAYS BEFORE ISSUING A FINAL NEGATIVE REPORT Performed at [facility]    Report Status PENDING  Incomplete  Culture, blood (routine x 2)     Status: None (Preliminary result)   Collection Time: 06/16/14 11:53 AM  Result Value Ref Range Status   Specimen Description BLOOD LEFT HAND  Final   Special Requests BOTTLES DRAWN AEROBIC AND ANAEROBIC 4CC  Final   Culture   Final           BLOOD CULTURE RECEIVED NO GROWTH TO DATE CULTURE WILL BE HELD FOR 5 DAYS BEFORE ISSUING A FINAL NEGATIVE REPORT Performed at [facility]    Report Status PENDING  Incomplete  Gram stain     Status: None   Collection Time: 06/16/14  5:07 PM  Result Value Ref Range Status   Specimen Description CSF  Final    Special Requests NONE  Final   Gram Stain   Final    NO ORGANISMS SEEN NO WBC SEEN CYTOSPIN Gram Stain Report Called to,Read Back By and Verified With: ALDRIDGE,D. RN  ON 1.13.16 BY MCCOY,N.    Report Status 06/16/2014 FINAL  Final  CSF culture     Status: None   Collection Time: 06/16/14  5:07 PM  Result Value Ref Range Status   Specimen Description CSF  Final   Special Requests NONE  Final   Gram Stain   Final    NO WBC SEEN NO ORGANISMS SEEN Performed at [facility]    Culture   Final    NO GROWTH 3 DAYS Performed at [facility]    Report Status 06/20/2014 FINAL  Final  MRSA PCR Screening     Status: Abnormal   Collection Time: 06/16/14  5:19 PM  Result Value Ref Range Status   MRSA by PCR POSITIVE (A) NEGATIVE Final    Comment:        The GeneXpert MRSA Assay (FDA approved for NASAL specimens only), is one component of a comprehensive MRSA colonization surveillance program. It is not intended to diagnose MRSA infection nor to guide or monitor treatment for MRSA infections. RESULT CALLED TO, READ BACK BY AND VERIFIED WITH: C.DENNY,RN AT 2357 ON 06/16/14 BY SHEA.W   Culture, respiratory (NON-Expectorated)     Status: None   Collection Time: 06/19/14 10:16 AM  Result Value Ref Range Status   Specimen Description ENDOTRACHEAL  Final   Special Requests NONE  Final   Gram Stain   Final    NO WBC SEEN NO SQUAMOUS EPITHELIAL CELLS SEEN NO ORGANISMS SEEN Performed at [facility]    Culture   Final    FEW CANDIDA ALBICANS Performed at [facility]    Report Status 06/21/2014 FINAL  Final  Culture, blood (routine x 2)     Status: None (Preliminary result)   Collection Time: 06/20/14  4:55 PM  Result Value Ref Range Status   Specimen Description BLOOD RIGHT ANTECUBITAL  Final   Special Requests BOTTLES DRAWN AEROBIC AND ANAEROBIC 10CC EACH  Final   Culture   Final           BLOOD CULTURE RECEIVED NO GROWTH TO DATE  CULTURE  WILL BE HELD FOR 5 DAYS BEFORE ISSUING A FINAL NEGATIVE REPORT [facility]    Report Status PENDING  Incomplete  Culture, blood (routine x 2)     Status: None (Preliminary result)   Collection Time: 06/20/14  5:07 PM  Result Value Ref Range Status   Specimen Description BLOOD RIGHT ANTECUBITAL  Final   Special Requests BOTTLES DRAWN AEROBIC AND ANAEROBIC 10 CC EACH  Final   Culture   Final           BLOOD CULTURE RECEIVED NO GROWTH TO DATE CULTURE WILL BE HELD FOR 5 DAYS BEFORE ISSUING A FINAL NEGATIVE REPORT [facility]formed at [facility]    Report Status PENDING  Incomplete    Assessment: There is still no clear explanation for her febrile illness. She appears to be improving neurologically.  Plan: 1. Continue current antibiotics for now  Cliffton AstersJohn Galina Haddox, MD [facility] [phone] pager   [phone] cell 06/21/2014, 3:47 PM

## 2014-06-21 NOTE — Progress Notes (Signed)
RT assessed PT for ETT cuff leak pre extubation- resulted in negative cuff leak. RN and MD aware. RT did not extubate PT- RN and MD aware.

## 2014-06-21 NOTE — Progress Notes (Signed)
RT with RN assistance changed ETT tube holder- uneventful.

## 2014-06-21 NOTE — Progress Notes (Signed)
Name: [name] [name] MRN: [id number] DOB: [birth year]    ADMISSION DATE:  06/16/2014  REFERRING MD :  Dr. Sanjuana Letters  CHIEF COMPLAINT:  AMS / Fever   BRIEF PATIENT DESCRIPTION:  74 yo female from SNF with fever (Tm 102F) and altered mental status.  Transferred to ICU with symptomatic bradycardia, VDRF.  SIGNIFICANT EVENTS  1/13  Admit with fever / AMS 1/15  ID consulted 1/15  Bradycardic arrest likely from respiratory failure, VDRF, to ICU 1/18 more awake alert  STUDIES:  1/13  CT Head >>  Negative  1/13  LP >> RBC 39, WBC 1 1/14  CT chest >> atherosclerosis 1/14  CT abd/pelvis >> CBD 10 mm, diverticulosis 1/15  MRI brain >> mild atrophy and small vessel disease 1/15  Echo >> mild LVH, EF 65 to 53%, grade 1 diastolic dysfx   SUBJECTIVE:  More awake and alert today, following commands  VITAL SIGNS: Temp:  [99.5 F (37.5 C)-102.7 F (39.3 C)] 101.6 F (38.7 C) (01/18 0800) Pulse Rate:  [77-99] 87 (01/18 0900) Resp:  [10-25] 19 (01/18 0900) BP: (82-121)/(44-71) 114/59 mmHg (01/18 0900) SpO2:  [95 %-99 %] 98 % (01/18 0900) FiO2 (%):  [35 %-40 %] 35 % (01/18 0834) Weight:  [77.4 kg (170 lb 10.2 oz)] 77.4 kg (170 lb 10.2 oz) (01/18 0619)  PHYSICAL EXAMINATION:  Gen: no acute distress on vent HEENT: NCAT, ETT in place PULM: CTA B CV: RRR, no mgr AB: BS+, soft, nontender Ext: warm, trace edema Neuro: Awake on vent, follows commands, lifts head from pillow on command   CBC Latest Ref Rng 06/21/2014 06/20/2014 06/19/2014  WBC 4.0 - 10.5 K/uL 9.5 9.7 14.2(H)  Hemoglobin 12.0 - 15.0 g/dL 10.0(L) 10.0(L) 11.3(L)  Hematocrit 36.0 - 46.0 % 33.4(L) 33.4(L) 39.7  Platelets 150 - 400 K/uL 100(L) 114(L) 167    BMP Latest Ref Rng 06/21/2014 06/20/2014 06/20/2014  Glucose 70 - 99 mg/dL 149(H) 171(H) 145(H)  BUN 6 - 23 mg/dL 37(H) 33(H) 35(H)  Creatinine 0.50 - 1.10 mg/dL 1.37(H) 1.54(H) 1.48(H)  Sodium 135 - 145 mmol/L 159(H) 157(H) 154(H)  Potassium 3.5 - 5.1 mmol/L 3.7 4.1 3.6    Chloride 96 - 112 mEq/L 128(H) >130(HH) 124(H)  CO2 19 - 32 mmol/L 25 23 23   Calcium 8.4 - 10.5 mg/dL 8.9 8.7 8.5    Hepatic Function Latest Ref Rng 06/20/2014 06/18/2014 06/18/2014  Total Protein 6.0 - 8.3 g/dL 4.9(L) 6.2 6.6  Albumin 3.5 - 5.2 g/dL 2.5(L) 3.1(L) 3.5  AST 0 - 37 U/L 27 70(H) 35  ALT 0 - 35 U/L 31 56(H) 37(H)  Alk Phosphatase 39 - 117 U/L 52 83 88  Total Bilirubin 0.3 - 1.2 mg/dL 0.7 0.7 0.5  Bilirubin, Direct 0.0 - 0.3 mg/dL <0.1 - -    CBG (last 3)   Recent Labs  06/21/14 0026 06/21/14 0519 06/21/14 0758  GLUCAP 119* 152* 131*     ABG    Component Value Date/Time   PHART 7.398 06/19/2014 0432   PCO2ART 33.2* 06/19/2014 0432   PO2ART 97.0 06/19/2014 0432   HCO3 19.9* 06/19/2014 0432   TCO2 18.2 06/19/2014 0432   ACIDBASEDEF 3.6* 06/19/2014 0432   O2SAT 98.0 06/19/2014 0432    Dg Chest Port 1 View  06/21/2014   CLINICAL DATA:  Aspiration pneumonitis.  EXAM: PORTABLE CHEST - 1 VIEW  COMPARISON:  06/20/2014.  FINDINGS: Endotracheal tube is in similar position, tip 18 mm above the carina. A left IJ catheter and orogastric  tube remain in good position.  Stable heart size and mediastinal contours given differences in technique. Lung volumes remain low and there is increased and retrocardiac opacity with air bronchograms. There is also coarsening of lung markings at the medial right base that is more prominent. No edema, effusion, or pneumothorax.  IMPRESSION: 1. Increased bibasilar opacities, atelectasis based on CT 06/17/2014. 2. Similar positioning of tubes and central line, including endotracheal tube tip 18 mm above the carina.   Electronically Signed   By: Jorje Guild M.D.   On: 06/21/2014 06:27   Dg Chest Port 1 View  06/20/2014   CLINICAL DATA:  Subsequent encounter for aspiration pneumonitis.  EXAM: PORTABLE CHEST - 1 VIEW  COMPARISON:  1 day prior  FINDINGS: Support apparatus: Endotracheal tube terminates 2.3 cm above carina. Left internal jugular  line terminates at low SVC. Nasogastric tube extends beyond the inferior aspect of the film.  Cardiomediastinal silhouette: Patient rotated right. Cardiomegaly with tortuous thoracic aorta.  Pleura:  No pleural effusion or pneumothorax.  Lungs: No congestive failure. Improved left side aeration with mild left base atelectasis remaining. Moderate right hemidiaphragm elevation.  Other: None  IMPRESSION: Improved left side aeration with mild atelectasis remaining.  Cardiomegaly without congestive failure.   Electronically Signed   By: Abigail Miyamoto M.D.   On: 06/20/2014 07:18     ASSESSMENT / PLAN:  PULMONARY ETT 1/15 >>  A: Acute respiratory failure 2nd to recurrent aspiration PNA > resolved P: Extubate 1/18 NPO four hours then SLP eval O2 as needed for O2 saturation > 92%  CARDIAC Lt IJ CVL 1/15 >> A: Symptomatic bradycardia from respiratory failure, resolved Demand ischemia in setting of respiratory failure Hx of HTN P: Tele Continue ASA Continue zocor Hold outpt norvasc for now  RENAL A: Hypernatremia with polyuria>> persistant, Li related? AKI 2nd to volume depletion > improving P: Increase IV fluid to D5W at 125 ml/hr Monitor BMET and UOP Replace electrolytes as needed   GASTROENTEROLOGY A: Dysphagia P: SLP eval post extubation Protonix for SUP Will need speech assessment after extubation  HEMATOLOGY A: Mild anemia Thrombocytopenia P: F/u CBC Lovenox for DVT prevention  INFECTION A: Sepsis 2nd to recurrent aspiration PNA > resolved P: Day 6 vancomycin, cefepime  Day 3 flagyl Abx per ID  Blood 1/13 >> CSF 1/13 >> Sputum 1/16 >>  ENDOCRINE A: Hx of hypothyroidism Hyperglycemia P: Continue levothyroxine SSI  NEUROLOGY A: Acute encephalopathy 2nd to sepsis, renal failure, hypernatremia >> improved 1/17 Hx of Bipolar 1 disorder. P: Hold outpt cogentin, tegretol, lithium, restoril, tramadol Will need psych evaluation once more stable to  assist with bipolar regimen  Summary: She likely had recurrent aspiration PNA leading to respiratory and then cardiac arrest, now resolved. Plan extubation today  Updated pt's son Rodman Key by phone 1/18.  CC time 40 minutes  Roselie Awkward, MD [place] [facility] Pager: [phone] Cell: [phone] If no response, call [phone]

## 2014-06-22 LAB — CBC WITH DIFFERENTIAL/PLATELET
BASOS ABS: 0 10*3/uL (ref 0.0–0.1)
BASOS PCT: 0 % (ref 0–1)
Eosinophils Absolute: 0 10*3/uL (ref 0.0–0.7)
Eosinophils Relative: 0 % (ref 0–5)
HCT: 33.6 % — ABNORMAL LOW (ref 36.0–46.0)
Hemoglobin: 9.8 g/dL — ABNORMAL LOW (ref 12.0–15.0)
LYMPHS PCT: 4 % — AB (ref 12–46)
Lymphs Abs: 0.3 10*3/uL — ABNORMAL LOW (ref 0.7–4.0)
MCH: 30.5 pg (ref 26.0–34.0)
MCHC: 29.2 g/dL — ABNORMAL LOW (ref 30.0–36.0)
MCV: 104.7 fL — AB (ref 78.0–100.0)
MONOS PCT: 3 % (ref 3–12)
Monocytes Absolute: 0.3 10*3/uL (ref 0.1–1.0)
Neutro Abs: 8.4 10*3/uL — ABNORMAL HIGH (ref 1.7–7.7)
Neutrophils Relative %: 93 % — ABNORMAL HIGH (ref 43–77)
Platelets: 110 10*3/uL — ABNORMAL LOW (ref 150–400)
RBC: 3.21 MIL/uL — ABNORMAL LOW (ref 3.87–5.11)
RDW: 14.3 % (ref 11.5–15.5)
WBC: 9 10*3/uL (ref 4.0–10.5)

## 2014-06-22 LAB — GLUCOSE, CAPILLARY
GLUCOSE-CAPILLARY: 144 mg/dL — AB (ref 70–99)
GLUCOSE-CAPILLARY: 221 mg/dL — AB (ref 70–99)
GLUCOSE-CAPILLARY: 253 mg/dL — AB (ref 70–99)
Glucose-Capillary: 219 mg/dL — ABNORMAL HIGH (ref 70–99)
Glucose-Capillary: 130 mg/dL — ABNORMAL HIGH (ref 70–99)
Glucose-Capillary: 121 mg/dL — ABNORMAL HIGH (ref 70–99)

## 2014-06-22 LAB — BASIC METABOLIC PANEL
Anion gap: 8 (ref 5–15)
BUN: 40 mg/dL — AB (ref 6–23)
CO2: 23 mmol/L (ref 19–32)
CREATININE: 1.31 mg/dL — AB (ref 0.50–1.10)
Calcium: 9 mg/dL (ref 8.4–10.5)
Chloride: 121 mEq/L — ABNORMAL HIGH (ref 96–112)
GFR calc non Af Amer: 39 mL/min — ABNORMAL LOW (ref >90)
GFR, EST AFRICAN AMERICAN: 45 mL/min — AB (ref >90)
GLUCOSE: 268 mg/dL — AB (ref 70–99)
POTASSIUM: 4 mmol/L (ref 3.5–5.1)
Sodium: 152 mmol/L — ABNORMAL HIGH (ref 135–145)

## 2014-06-22 LAB — CULTURE, BLOOD (ROUTINE X 2)
Culture: NO GROWTH
Culture: NO GROWTH

## 2014-06-22 MED ORDER — CHLORHEXIDINE GLUCONATE 0.12 % MT SOLN
15.0000 mL | Freq: Two times a day (BID) | OROMUCOSAL | Status: DC
Start: 2014-06-22 — End: 2014-06-25
  Administered 2014-06-22 – 2014-06-25 (×6): 15 mL via OROMUCOSAL
  Filled 2014-06-22 (×8): qty 15

## 2014-06-22 MED ORDER — CETYLPYRIDINIUM CHLORIDE 0.05 % MT LIQD
7.0000 mL | Freq: Two times a day (BID) | OROMUCOSAL | Status: DC
  Administered 2014-06-22 – 2014-06-24 (×5): 7 mL via OROMUCOSAL

## 2014-06-22 MED ORDER — ETOMIDATE 2 MG/ML IV SOLN
INTRAVENOUS | Status: AC
  Filled 2014-06-22: qty 20

## 2014-06-22 MED ORDER — FENTANYL CITRATE 0.05 MG/ML IJ SOLN: 12.5000 ug | INTRAMUSCULAR | Status: DC | PRN

## 2014-06-22 MED ORDER — MIDAZOLAM HCL 2 MG/2ML IJ SOLN
INTRAMUSCULAR | Status: AC
  Filled 2014-06-22: qty 4

## 2014-06-22 MED ORDER — SUCCINYLCHOLINE CHLORIDE 20 MG/ML IJ SOLN
INTRAMUSCULAR | Status: AC
  Filled 2014-06-22: qty 1

## 2014-06-22 MED ORDER — DEXTROSE 5 % IV SOLN
INTRAVENOUS | Status: AC
  Administered 2014-06-22 – 2014-06-24 (×3): via INTRAVENOUS

## 2014-06-22 MED ORDER — LIDOCAINE HCL (CARDIAC) 20 MG/ML IV SOLN
INTRAVENOUS | Status: AC
  Filled 2014-06-22: qty 5

## 2014-06-22 MED ORDER — FENTANYL CITRATE 0.05 MG/ML IJ SOLN
INTRAMUSCULAR | Status: AC
  Filled 2014-06-22: qty 4

## 2014-06-22 MED ORDER — ROCURONIUM BROMIDE 50 MG/5ML IV SOLN
INTRAVENOUS | Status: AC
  Filled 2014-06-22: qty 2

## 2014-06-22 MED ORDER — RESOURCE THICKENUP CLEAR PO POWD
ORAL | Status: DC | PRN
  Filled 2014-06-22: qty 125

## 2014-06-22 NOTE — Progress Notes (Signed)
PT Cancellation Note  Patient Details Name: [name] [name] MRN: [id number] DOB: 12/17/1940   Cancelled Treatment:    Reason Eval/Treat Not Completed: Patient not medically ready; pt still on vent, for possible extubation today, will defer PT eval at this time and re-attepmpt as schedule permits;   [facility] Center[facility]ILLIAMS,Caydence Enck 06/22/2014, 8:48 AM

## 2014-06-22 NOTE — Procedures (Signed)
Extubation Procedure Note  Patient Details:   Name: [name] [name] DOB: Oct 22, 1940 MRN: [id number]   Airway Documentation:  AIRWAYS (Active)     Airway 7.5 mm (Active)  Secured at (cm) 21 cm 06/22/2014  8:00 AM  Measured From Lips 06/22/2014  8:00 AM  Secured Location Right 06/22/2014  3:53 AM  Secured By [employer] Tube Holder 06/22/2014  8:00 AM  Tube Holder Repositioned Yes 06/22/2014  8:00 AM  Cuff Pressure (cm H2O) 28 cm H2O 06/21/2014  8:14 PM  Site Condition Dry 06/22/2014  3:53 AM    Evaluation  O2 sats: stable throughout Complications: No apparent complications Patient did tolerate procedure well. Bilateral Breath Sounds: Clear, Diminished Suctioning: Airway Yes  Dairl PonderWalters, Jejuan Scala Nannette 06/22/2014, 9:18 AM   RT placed PT on 3 lpm [place]- PT does not appear to be in any respiratory distress at this time.

## 2014-06-22 NOTE — Progress Notes (Signed)
Name: [name] [name] MRN: [id number] DOB: Nov 15, 1940    ADMISSION DATE:  06/16/2014  REFERRING MD :  Dr. Sanjuana Letters  CHIEF COMPLAINT:  AMS / Fever   BRIEF PATIENT DESCRIPTION:  74 yo female from SNF with fever (Tm 102F) and altered mental status.  Transferred to ICU with symptomatic bradycardia, VDRF.  SIGNIFICANT EVENTS  1/13  Admit with fever / AMS 1/15  ID consulted 1/15  Bradycardic arrest likely from respiratory failure, VDRF, to ICU 1/18 more awake alert, no ETT cuff leak > decadron  STUDIES:  1/13  CT Head >>  Negative  1/13  LP >> RBC 39, WBC 1 1/14  CT chest >> atherosclerosis 1/14  CT abd/pelvis >> CBD 10 mm, diverticulosis 1/15  MRI brain >> mild atrophy and small vessel disease 1/15  Echo >> mild LVH, EF 65 to 08%, grade 1 diastolic dysfx   SUBJECTIVE:  Tolerated PSV yesterday for 8-10 hours, no cuff leak > given decadron  VITAL SIGNS: Temp:  [97.5 F (36.4 C)-101.6 F (38.7 C)] 97.5 F (36.4 C) (01/19 0400) Pulse Rate:  [56-90] 79 (01/19 0700) Resp:  [13-19] 15 (01/19 0700) BP: (100-130)/(45-70) 116/69 mmHg (01/19 0700) SpO2:  [95 %-100 %] 99 % (01/19 0700) FiO2 (%):  [35 %] 35 % (01/19 0353)  PHYSICAL EXAMINATION:  Gen: no acute distress on vent HEENT: NCAT, ETT in place PULM: CTA B CV: RRR, no mgr AB: BS+, soft, nontender Ext: warm, trace edema Neuro: Awake on vent, follows commands, lifts head from pillow on command   CBC Latest Ref Rng 06/22/2014 06/21/2014 06/20/2014  WBC 4.0 - 10.5 K/uL 9.0 9.5 9.7  Hemoglobin 12.0 - 15.0 g/dL 9.8(L) 10.0(L) 10.0(L)  Hematocrit 36.0 - 46.0 % 33.6(L) 33.4(L) 33.4(L)  Platelets 150 - 400 K/uL 110(L) 100(L) 114(L)    BMP Latest Ref Rng 06/22/2014 06/21/2014 06/20/2014  Glucose 70 - 99 mg/dL 268(H) 149(H) 171(H)  BUN 6 - 23 mg/dL 40(H) 37(H) 33(H)  Creatinine 0.50 - 1.10 mg/dL 1.31(H) 1.37(H) 1.54(H)  Sodium 135 - 145 mmol/L 152(H) 159(H) 157(H)  Potassium 3.5 - 5.1 mmol/L 4.0 3.7 4.1  Chloride 96 - 112 mEq/L  121(H) 128(H) >130(HH)  CO2 19 - 32 mmol/L 23 25 23   Calcium 8.4 - 10.5 mg/dL 9.0 8.9 8.7    Hepatic Function Latest Ref Rng 06/20/2014 06/18/2014 06/18/2014  Total Protein 6.0 - 8.3 g/dL 4.9(L) 6.2 6.6  Albumin 3.5 - 5.2 g/dL 2.5(L) 3.1(L) 3.5  AST 0 - 37 U/L 27 70(H) 35  ALT 0 - 35 U/L 31 56(H) 37(H)  Alk Phosphatase 39 - 117 U/L 52 83 88  Total Bilirubin 0.3 - 1.2 mg/dL 0.7 0.7 0.5  Bilirubin, Direct 0.0 - 0.3 mg/dL <0.1 - -    CBG (last 3)   Recent Labs  06/21/14 1926 06/22/14 0011 06/22/14 0415  GLUCAP 239* 221* 253*     ABG    Component Value Date/Time   PHART 7.398 06/19/2014 0432   PCO2ART 33.2* 06/19/2014 0432   PO2ART 97.0 06/19/2014 0432   HCO3 19.9* 06/19/2014 0432   TCO2 18.2 06/19/2014 0432   ACIDBASEDEF 3.6* 06/19/2014 0432   O2SAT 98.0 06/19/2014 0432    Dg Chest Port 1 View  06/21/2014   CLINICAL DATA:  Aspiration pneumonitis.  EXAM: PORTABLE CHEST - 1 VIEW  COMPARISON:  06/20/2014.  FINDINGS: Endotracheal tube is in similar position, tip 18 mm above the carina. A left IJ catheter and orogastric tube remain in good position.  Stable  heart size and mediastinal contours given differences in technique. Lung volumes remain low and there is increased and retrocardiac opacity with air bronchograms. There is also coarsening of lung markings at the medial right base that is more prominent. No edema, effusion, or pneumothorax.  IMPRESSION: 1. Increased bibasilar opacities, atelectasis based on CT 06/17/2014. 2. Similar positioning of tubes and central line, including endotracheal tube tip 18 mm above the carina.   Electronically Signed   By: Jorje Guild M.D.   On: 06/21/2014 06:27     ASSESSMENT / PLAN:  PULMONARY ETT 1/15 >>  A: Acute respiratory failure 2nd to recurrent aspiration PNA > resolved No cuff leak 1/18 > given decadron P: Extubate 1/19 > have airway box at bedside NPO post extubation O2 as needed for O2 saturation > 92%  CARDIAC Lt IJ CVL  1/15 >> A: Symptomatic bradycardia from respiratory failure, resolved Demand ischemia in setting of respiratory failure Hx of HTN P: Tele Continue ASA Continue zocor Hold outpt norvasc for now  RENAL A: Hypernatremia with polyuria>> persistant, Li related, nephrogenic DI? AKI 2nd to volume depletion > improving P: Continue IV fluid to D5W at 125 ml/hr Hold Lithium as long as possible, free water restriction test not feasible right now (she is benefiting from receiving free water) Monitor BMET and UOP Replace electrolytes as needed   GASTROENTEROLOGY A: Dysphagia P: SLP eval post extubation Protonix for SUP  HEMATOLOGY A: Mild anemia Thrombocytopenia P: F/u CBC Lovenox for DVT prevention  INFECTION A: Sepsis 2nd to recurrent aspiration PNA > resolved P: Day 7 vancomycin, cefepime  Day 4 flagyl Abx per ID  Blood 1/13 >> CSF 1/13 >> Sputum 1/16 >>  ENDOCRINE A: Hx of hypothyroidism Hyperglycemia P: Continue levothyroxine SSI  NEUROLOGY A: Acute encephalopathy 2nd to sepsis, renal failure, hypernatremia >> improved 1/19 Hx of Bipolar 1 disorder. P: Hold outpt cogentin, tegretol, lithium, restoril, tramadol Will need psych evaluation once more stable to assist with bipolar regimen  Summary: She likely had recurrent aspiration PNA leading to respiratory and then cardiac arrest, now resolved.  Updated pt's son Rodman Key by phone 1/19, discussed risks and benefits of extubation in setting of no cuff leak (risks of ongoing mechanical ventilation> pneumonia, delirium, deconditioning) vs risk of stridor/respiratory failure. As she tolerated PSV all day yesterday will proceed with extubation, airway box at bedside.   CC time 35 minutes  Roselie Awkward, MD [facility] Pager: [phone] Cell: [phone] If no response, call [phone]

## 2014-06-22 NOTE — Evaluation (Signed)
Clinical/Bedside Swallow Evaluation Patient Details  Name: [name] [name] MRN: [id number] Date of Birth: Aug 07, 1940  Today's Date: 06/22/2014 Time: 1415-1440 SLP Time Calculation (min) (ACUTE ONLY): 25 min  Past Medical History:  Past Medical History  Diagnosis Date  . Hypertension   . Bipolar 1 disorder   . Hypothyroidism   . Memory difficulties   . HLD (hyperlipidemia)   . Diverticulitis large intestine   . Pneumonia    Past Surgical History:  Past Surgical History  Procedure Laterality Date  . No past surgeries     HPI:  74 yo female [facility] with fever and AMS. Pt found to have recurrent pna- CXR showed increased bilateral opacities - ATX.  During hospital stay, pt became lethargic and had agonal respirations - code blue called and pt received CPR and was intubated on 1/15-1/19.  Swallow evaluation ordered s/p extubation.  Pt had a clinical swallow evaluation during previous admission and was placed on dys3/nectar diet.     Assessment / Plan / Recommendation Clinical Impression  Pt presents with symptoms of oropharyngeal dysphagia likely consistent with findings during November 2015 clinical evaluation.   SLP tested only nectar and puree given known h/o overt coughing with liquids and lack of dentition.    Lingual posture noted to be anterior and deviate to right upon protrusion.  Pt demonstrates difficulties closing lips around cup resulting in anterior labial spillage of liquid (right more than left but also bilaterally).  Providing liquid via tsp prevented labial spillage.  Delayed swallow suspected with intermittent multiple swallows - ? oral residuals prematurely spilling into pharynx.  No s/s of aspiration with nectar or small bolus of applesauce.     SLP phoned SNF SLP (with pt permission) and SNF SLP relayed pt was on soft or puree with nectar liquids.  Violent coughing episodes noted with thin liquids at SNF per SNF SLP.  Pt was dc'd from SNF SLP caseload in December  2015.    Recommend continue nectar thickened clears and MBS be completed given this is the 2nd admission within six months and ongoing concerns for aspiration.  Pt and RN educated to recommendations.     Aspiration Risk  Moderate    Diet Recommendation Nectar-thick liquid   Liquid Administration via: Cup;Spoon;No straw Medication Administration: Whole meds with puree Supervision: Full supervision/cueing for compensatory strategies Compensations: Slow rate;Small sips/bites (allow time for intermittent dry swallow) Postural Changes and/or Swallow Maneuvers: Seated upright 90 degrees;Upright 30-60 min after meal    Other  Recommendations Recommended Consults: MBS Oral Care Recommendations: Oral care BID Other Recommendations: Order thickener from pharmacy;Prohibited food (jello, ice cream, thin soups)   Follow Up Recommendations  Skilled Nursing facility    Frequency and Duration min 2x/week  2 weeks   Pertinent Vitals/Pain Low grade fever      Swallow Study Prior Functional Status   pt resides at SNF    General Date of Onset: 06/22/14 HPI: 74 yo [facility] and AMS. Pt found to have recurrent pna- CXR showed increased bilateral opacities - ATX.  During hospital stay, pt became lethargic and had agonal respirations - code blue called and pt received CPR and was intubated on 1/15-1/19.  Swallow evaluation ordered s/p extubation.  Pt had a clinical swallow evaluation during previous admission and was placed on dys3/nectar diet.   Type of Study: Bedside swallow evaluation Diet Prior to this Study: Nectar-thick liquids (clears) Temperature Spikes Noted: Yes Respiratory Status: Nasal cannula History of  Recent Intubation: Yes Length of Intubations (days): 5 days Date extubated: 06/22/14 Behavior/Cognition: Alert;Cooperative;Requires cueing;Impulsive Oral Cavity - Dentition: Adequate natural dentition Self-Feeding Abilities: Needs assist (pt with difficulty holding  cup - ? due to arthritis issues and/or generalized weakness) Patient Positioning: Upright in bed Baseline Vocal Quality: Low vocal intensity;Hoarse (slightly hoarse) Volitional Cough: Weak Volitional Swallow: Able to elicit    Oral/Motor/Sensory Function Overall Oral Motor/Sensory Function:  (lingual deviation to right upon protrusion, sluggish palatal elevation)   Ice Chips Ice chips: Not tested   Thin Liquid Thin Liquid: Not tested Other Comments: due to known h/o severe coughing episodes/aspiration issues on thin drinks    Nectar Thick Nectar Thick Liquid: Impaired Presentation: Cup;Spoon;Straw Oral Phase Impairments: Reduced lingual movement/coordination;Reduced labial seal;Impaired anterior to posterior transit Oral phase functional implications: Right anterior spillage;Left anterior spillage;Prolonged oral transit Pharyngeal Phase Impairments: Suspected delayed Swallow;Multiple swallows   Honey Thick Honey Thick Liquid: Not tested   Puree Puree: Impaired Presentation: Spoon Oral Phase Impairments: Reduced labial seal;Reduced lingual movement/coordination Oral Phase Functional Implications: Prolonged oral transit Pharyngeal Phase Impairments: Suspected delayed Swallow;Multiple swallows   Solid   GO    Solid: Not tested Other Comments: due to pt's lack of dentition and aspiration concerns with deconditioning/coming off ventilator today        Donavan Burnet, MS [facility] SLP [phone]

## 2014-06-22 NOTE — Progress Notes (Signed)
Patient ID: [name] [name], female   DOB: 1941/01/24, 74 y.o.   MRN: [id number]         [facility]    Date of Admission:  06/16/2014           Day 7 vancomycin        Day 7 cefepime        Day 3 metronidazole  Principal Problem:   Fever Active Problems:   Acute kidney injury   Acute encephalopathy   Hypernatremia   Dehydration   Sepsis   Elevated troponin   Transaminitis   Bipolar 1 disorder   Essential hypertension   Hypothyroid   Macrocytosis without anemia   Atherosclerotic peripheral vascular disease   Altered mental state   Aspiration pneumonitis   FUO (fever of unknown origin)   Encephalopathy   . acetylcysteine  3 mL Nebulization TID  . albuterol  2.5 mg Nebulization Q6H  . antiseptic oral rinse  7 mL Mouth Rinse q12n4p  . aspirin  81 mg Per Tube Daily  . ceFEPime (MAXIPIME) IV  1 g Intravenous Q24H  . chlorhexidine  15 mL Mouth Rinse BID  . dexamethasone  6 mg Intravenous Q12H  . enoxaparin (LOVENOX) injection  40 mg Subcutaneous Q24H  . free water  200 mL Per Tube Q6H  . insulin aspart  0-15 Units Subcutaneous 6 times per day  . levothyroxine  150 mcg Per Tube QAC breakfast  . metronidazole  500 mg Intravenous 3 times per day  . pantoprazole sodium  40 mg Per Tube Q24H  . simvastatin  20 mg Per Tube QHS  . sodium chloride  3 mL Intravenous Q12H  . thiamine  100 mg Per Tube Daily  . vancomycin  750 mg Intravenous Q24H    OBJECTIVE: Blood pressure 102/63, pulse 78, temperature 98.5 F (36.9 C), temperature source Oral, resp. rate 17, height 5\' 8"  (1.727 m), weight 170 lb 10.2 oz (77.4 kg), SpO2 93 %.  General: She is more alert and now extubated Skin: No rash Lungs: Clear anteriorly Cor: Regular S1 and S2 with an early 1/6 systolic murmur Abdomen: Soft and not obviously tender  Lab Results Lab Results  Component Value Date   WBC 9.0 06/22/2014   HGB 9.8* 06/22/2014   HCT 33.6* 06/22/2014   MCV 104.7* 06/22/2014   PLT 110* 06/22/2014    Lab Results  Component Value Date   CREATININE 1.31* 06/22/2014   BUN 40* 06/22/2014   NA 152* 06/22/2014   K 4.0 06/22/2014   CL 121* 06/22/2014   CO2 23 06/22/2014    Lab Results  Component Value Date   ALT 31 06/20/2014   AST 27 06/20/2014   ALKPHOS 52 06/20/2014   BILITOT 0.7 06/20/2014     Microbiology: Recent Results (from the past 240 hour(s))  Urine culture     Status: None   Collection Time: 06/16/14 11:38 AM  Result Value Ref Range Status   Specimen Description URINE, CATHETERIZED  Final   Special Requests NONE  Final   Colony Count NO GROWTH Performed at [facility]   Final   Culture NO GROWTH Performed at [facility]   Final   Report Status 06/17/2014 FINAL  Final  Culture, blood (routine x 2)     Status: None   Collection Time: 06/16/14 11:53 AM  Result Value Ref Range Status   Specimen Description BLOOD RIGHT HAND  Final   Special Requests BOTTLES DRAWN AEROBIC  AND ANAEROBIC 5CC  Final   Culture   Final    NO GROWTH 5 DAYS Performed at [facility]    Report Status 06/22/2014 FINAL  Final  Culture, blood (routine x 2)     Status: None   Collection Time: 06/16/14 11:53 AM  Result Value Ref Range Status   Specimen Description BLOOD LEFT HAND  Final   Special Requests BOTTLES DRAWN AEROBIC AND ANAEROBIC 4CC  Final   Culture   Final    NO GROWTH 5 DAYS Performed at [facility]    Report Status 06/22/2014 FINAL  Final  Gram stain     Status: None   Collection Time: 06/16/14  5:07 PM  Result Value Ref Range Status   Specimen Description CSF  Final   Special Requests NONE  Final   Gram Stain   Final    NO ORGANISMS SEEN NO WBC SEEN CYTOSPIN Gram Stain Report Called to,Read Back By and Verified With: ALDRIDGE,D. RN  ON 1.13.16 BY MCCOY,N.    Report Status 06/16/2014 FINAL  Final  CSF culture     Status: None   Collection Time: 06/16/14  5:07 PM  Result Value Ref Range Status    Specimen Description CSF  Final   Special Requests NONE  Final   Gram Stain   Final    NO WBC SEEN NO ORGANISMS SEEN Performed at [facility]    Culture   Final    NO GROWTH 3 DAYS Performed at [facility]    Report Status 06/20/2014 FINAL  Final  MRSA PCR Screening     Status: Abnormal   Collection Time: 06/16/14  5:19 PM  Result Value Ref Range Status   MRSA by PCR POSITIVE (A) NEGATIVE Final    Comment:        The GeneXpert MRSA Assay (FDA approved for NASAL specimens only), is one component of a comprehensive MRSA colonization surveillance program. It is not intended to diagnose MRSA infection nor to guide or monitor treatment for MRSA infections. RESULT CALLED TO, READ BACK BY AND VERIFIED WITH: C.DENNY,RN AT 2357 ON 06/16/14 BY SHEA.W   Culture, respiratory (NON-Expectorated)     Status: None   Collection Time: 06/19/14 10:16 AM  Result Value Ref Range Status   Specimen Description ENDOTRACHEAL  Final   Special Requests NONE  Final   Gram Stain   Final    NO WBC SEEN NO SQUAMOUS EPITHELIAL CELLS SEEN NO ORGANISMS SEEN Performed at [facility]    Culture   Final    FEW CANDIDA ALBICANS Performed at [facility]    Report Status 06/21/2014 FINAL  Final  Culture, blood (routine x 2)     Status: None (Preliminary result)   Collection Time: 06/20/14  4:55 PM  Result Value Ref Range Status   Specimen Description BLOOD RIGHT ANTECUBITAL  Final   Special Requests BOTTLES DRAWN AEROBIC AND ANAEROBIC 10CC EACH  Final   Culture   Final           BLOOD CULTURE RECEIVED NO GROWTH TO DATE CULTURE WILL BE HELD FOR 5 DAYS BEFORE ISSUING A FINAL NEGATIVE REPORT Performed at [facility]    Report Status PENDING  Incomplete  Culture, blood (routine x 2)     Status: None (Preliminary result)   Collection Time: 06/20/14  5:07 PM  Result Value Ref Range Status   Specimen Description BLOOD RIGHT ANTECUBITAL  Final   Special Requests  BOTTLES DRAWN AEROBIC AND ANAEROBIC 10 CC EACH  Final   Culture   Final           BLOOD CULTURE RECEIVED NO GROWTH TO DATE CULTURE WILL BE HELD FOR 5 DAYS BEFORE ISSUING A FINAL NEGATIVE REPORT Performed at [facility]    Report Status PENDING  Incomplete    Assessment: She is improving slowly on empiric therapy for recent febrile illness and possible superimposed aspiration pneumonia. She has defervesced seen. This may be related to the dexamethasone that was started yesterday, antibiotic therapy and/or time. I will continue current antibiotic therapy for now.  Plan: 1. Continue current antibiotics for now  Cliffton Asters, MD [facility] [phone] pager   [phone] cell 06/22/2014, 5:04 PM

## 2014-06-22 NOTE — Progress Notes (Signed)
While feeding patient nectar thickened clear liquid diet, Patient started coughing and desaturated. Upon prompting to cough by staff patient coughed up a moderate size mucous plug. Once cleared patient stopped coughing and oxygen saturation returned to normal. Will continue to monitor.

## 2014-06-23 ENCOUNTER — Inpatient Hospital Stay (HOSPITAL_COMMUNITY): Payer: Medicare Other

## 2014-06-23 LAB — BASIC METABOLIC PANEL
ANION GAP: 6 (ref 5–15)
BUN: 36 mg/dL — ABNORMAL HIGH (ref 6–23)
CO2: 27 mmol/L (ref 19–32)
Calcium: 8.9 mg/dL (ref 8.4–10.5)
Chloride: 119 mEq/L — ABNORMAL HIGH (ref 96–112)
Creatinine, Ser: 1.14 mg/dL — ABNORMAL HIGH (ref 0.50–1.10)
GFR calc Af Amer: 54 mL/min — ABNORMAL LOW (ref >90)
GFR, EST NON AFRICAN AMERICAN: 46 mL/min — AB (ref >90)
Glucose, Bld: 121 mg/dL — ABNORMAL HIGH (ref 70–99)
POTASSIUM: 3.5 mmol/L (ref 3.5–5.1)
SODIUM: 152 mmol/L — AB (ref 135–145)

## 2014-06-23 LAB — CBC WITH DIFFERENTIAL/PLATELET
Basophils Absolute: 0 10*3/uL (ref 0.0–0.1)
Basophils Relative: 0 % (ref 0–1)
Eosinophils Absolute: 0.3 10*3/uL (ref 0.0–0.7)
Eosinophils Relative: 4 % (ref 0–5)
HEMATOCRIT: 32.3 % — AB (ref 36.0–46.0)
HEMOGLOBIN: 9.6 g/dL — AB (ref 12.0–15.0)
LYMPHS ABS: 1.1 10*3/uL (ref 0.7–4.0)
Lymphocytes Relative: 13 % (ref 12–46)
MCH: 30.8 pg (ref 26.0–34.0)
MCHC: 29.7 g/dL — ABNORMAL LOW (ref 30.0–36.0)
MCV: 103.5 fL — ABNORMAL HIGH (ref 78.0–100.0)
MONO ABS: 0.4 10*3/uL (ref 0.1–1.0)
MONOS PCT: 6 % (ref 3–12)
NEUTROS ABS: 6.2 10*3/uL (ref 1.7–7.7)
Neutrophils Relative %: 77 % (ref 43–77)
Platelets: 139 10*3/uL — ABNORMAL LOW (ref 150–400)
RBC: 3.12 MIL/uL — AB (ref 3.87–5.11)
RDW: 14.1 % (ref 11.5–15.5)
WBC: 8 10*3/uL (ref 4.0–10.5)

## 2014-06-23 LAB — GLUCOSE, CAPILLARY
GLUCOSE-CAPILLARY: 107 mg/dL — AB (ref 70–99)
GLUCOSE-CAPILLARY: 127 mg/dL — AB (ref 70–99)
Glucose-Capillary: 121 mg/dL — ABNORMAL HIGH (ref 70–99)
Glucose-Capillary: 121 mg/dL — ABNORMAL HIGH (ref 70–99)
Glucose-Capillary: 149 mg/dL — ABNORMAL HIGH (ref 70–99)

## 2014-06-23 MED ORDER — LITHIUM CARBONATE ER 300 MG PO TBCR
300.0000 mg | EXTENDED_RELEASE_TABLET | Freq: Two times a day (BID) | ORAL | Status: DC
  Administered 2014-06-23 – 2014-06-25 (×5): 300 mg via ORAL
  Filled 2014-06-23 (×8): qty 1

## 2014-06-23 MED ORDER — BOOST / RESOURCE BREEZE PO LIQD
1.0000 | Freq: Three times a day (TID) | ORAL | Status: DC
  Administered 2014-06-23 – 2014-06-25 (×6): 1 via ORAL

## 2014-06-23 NOTE — Progress Notes (Signed)
CSW continuing to follow.  CSW reviewed chart and noted that pt extubated yesterday and has modified barium swallow evaluation pending.  CSW contacted pt son, Molli HazardMatthew via telephone. CSW introduced self and explained role. Pt son confirmed that he wanted pt to return to [name] when pt stabilizes. CSW provided support as pt son discussed that he is hopeful that pt will be able to participate in physical therapy while in hospital once more stable. Pt son discussed that pt was transitioned to long term care at [name] and pt son is hopeful that pt will be able to [name] returns. CSW discussed with pt son that as long as pt tolerates therapy and there is an order from the hospital for physical therapy then [name] should be able to provide rehab if pt has Medicare days remaining. Per pt son, pt only used two weeks of Medicare days prior to facility transitioning pt to long term. Pt son discussed that he was hopeful that pt could be evaluated by psychiatrist in the hospital. Pt son shared that pt had a psychiatrist in the community that would come to her home to see her, but pt son does not think that pt had been seen by a psychiatrist while at [name] and wants to ensure that pt is followed. CSW discussed with pt son that MD order psych consult and psych MD plans to evaluate pt today per chart. Pt son expressed that he was grateful that pt would be seen by psychiatrist.   CSW contacted [name] and spoke with admissions coordinator. CSW confirmed with [name] that if pt participates with therapy in the hospital when stable and there is an order for therapy then [facility].   Pt will benefit from physical therapy and occupational therapy consults when pt stable to participate.   CSW to continue to follow to provide support and assist with pt disposition planning.   Loletta SpecterSuzanna Durelle Zepeda, MSW, LCSW Clinical  Social Work [phone]

## 2014-06-23 NOTE — Consult Note (Signed)
[facility] Face-to-Face Psychiatry Consult   Reason for Consult:  Bipolar medication management Referring Physician:  Dr. Kendrick FriesMCQuaid  Patient Identification: [name] Arhoebe Neidert MRN:  [id number] Principal Diagnosis: Fever Diagnosis:   Patient Active Problem List   Diagnosis Date Noted  . Encephalopathy [G93.40]   . Altered mental state [R41.82]   . Aspiration pneumonitis [J69.0]   . FUO (fever of unknown origin) [R50.9]   . Macrocytosis without anemia [D75.89] 06/18/2014  . Atherosclerotic peripheral vascular disease [I70.209] 06/18/2014  . Sepsis [A41.9] 06/17/2014  . Elevated troponin [R79.89] 06/17/2014  . Transaminitis [R74.0] 06/17/2014  . Acute encephalopathy [G93.40] 06/16/2014  . Fever [R50.9] 06/16/2014  . Hypernatremia [E87.0] 06/16/2014  . Dehydration [E86.0] 06/16/2014  . Community acquired pneumonia [J18.9] 04/26/2014  . Bipolar 1 disorder [F31.9] 04/26/2014  . Essential hypertension [I10] 04/26/2014  . Conjunctivitis [H10.9] 04/26/2014  . HLD (hyperlipidemia) [E78.5] 04/26/2014  . Hypothyroid [E03.9] 04/26/2014  . Acute kidney injury [N17.9] 04/26/2014  . Cough [R05]   . Weakness [R53.1]     Total Time spent with patient: 45 minutes  Subjective:   [name] [name] is a 74 y.o. female patient admitted with and increased tempe.  HPI:  [name] Arhoebe Cunanan is a 74 y.o. female seen and chart reviewed for psychiatric consultation and evaluation. Patient has been suffering with bipolar disorder, and her medications are on hold due to AMS and need of intubation and now she was extubated and no SOB. Patient reportedly has few episodes of depression and mania in the past with spending sprees and irritability and was treated with carbamazepine and lithium along with cogentin. She received ativan and restoril in the past. She is calm and cooperative and able to contribute for this evaluation. She denied current symptoms of depression, mania and anxiety. She denied suicide and homicide ideation,  intention or plans. She is willing to take her medication lithium TID to Lithobid BID for bipolar disorder, which may need medication adjustment and monitoring of lithium level. I don't see need of second mood stablizer or Cogentin at this time.   Medical history: Patient lives in a skilled nursing facility and has a history of hypertension, bipolar disorder, hypothyroidism, who was the in this hospital back in November and was treated for pneumonia. She also had acute renal failure at that time. These problems improved with treatment. She was then discharged to a skilled nursing facility. She is accompanied by her son today. She was sent over from there because of decreased level of consciousness. In the emergency department she was found to have a rectal temperature of 102F. The son saw her last about 4 days ago when she was doing well. According to the notes received from the nursing staff it appears that since yesterday patient has not been acting herself. She was more unresponsive today. There is no history of decreased oral intake the last few days. There is also no clear history of a fever at the skilled nursing facility. Patient is more responsive now than she was a few hours ago, but still unable to give any history whatsoever.  HPI Elements:   Location:  Bipolar disorder. Quality:  poor. Severity:  moderate. Timing:  acute hospitalization. Duration:  few days. Context:  pneumonia.  Past Medical History:  Past Medical History  Diagnosis Date  . Hypertension   . Bipolar 1 disorder   . Hypothyroidism   . Memory difficulties   . HLD (hyperlipidemia)   . Diverticulitis large intestine   . Pneumonia  Past Surgical History  Procedure Laterality Date  . No past surgeries     Family History:  Family History  Problem Relation Age of Onset  . Heart disease Father    Social History:  History  Alcohol Use No     History  Drug Use No    History   Social History  . Marital  Status: Single    Spouse Name: N/A    Number of Children: N/A  . Years of Education: N/A   Social History Main Topics  . Smoking status: Never Smoker   . Smokeless tobacco: Never Used  . Alcohol Use: No  . Drug Use: No  . Sexual Activity: None   Other Topics Concern  . None   Social History Narrative   Additional Social History:    Allergies:   Allergies  Allergen Reactions  . Antihistamine Decongestant [Triprolidine-Pse] Diarrhea  . Codeine Other (See Comments)    Gi upset     Vitals: Blood pressure 104/74, pulse 83, temperature 98.4 F (36.9 C), temperature source Oral, resp. rate 16, height  (1.727 m), weight 76.1 kg (167 lb 12.3 oz), SpO2 93 %.  Risk to Self: Is patient at risk for suicide?: No Risk to Others:   Prior Inpatient Therapy:   Prior Outpatient Therapy:    Current Facility-Administered Medications  Medication Dose Route Frequency Provider Last Rate Last Dose  . acetaminophen (TYLENOL) solution 650 mg  650 mg Per Tube Q6H PRN Coralyn Helling, MD   650 mg at 06/22/14 2028  . acetylcysteine (MUCOMYST) 20 % nebulizer / oral solution 3 mL  3 mL Nebulization TID Jeanella Craze, NP   3 mL at 06/23/14 0730  . albuterol (PROVENTIL) (2.5 MG/3ML) 0.083% nebulizer solution 2.5 mg  2.5 mg Nebulization Q2H PRN Osvaldo Shipper, MD      . albuterol (PROVENTIL) (2.5 MG/3ML) 0.083% nebulizer solution 2.5 mg  2.5 mg Nebulization Q6H Coralyn Helling, MD   2.5 mg at 06/23/14 0730  . antiseptic oral rinse (CPC / CETYLPYRIDINIUM CHLORIDE 0.05%) solution 7 mL  7 mL Mouth Rinse q12n4p Lupita Leash, MD   7 mL at 06/22/14 1600  . aspirin chewable tablet 81 mg  81 mg Per Tube Daily Coralyn Helling, MD   81 mg at 06/23/14 0932  . ceFEPIme (MAXIPIME) 1 g in dextrose 5 % 50 mL IVPB  1 g Intravenous Q24H Maurice March, RPH   1 g at 06/22/14 2227  . chlorhexidine (PERIDEX) 0.12 % solution 15 mL  15 mL Mouth Rinse BID Lupita Leash, MD   15 mL at 06/23/14 0900  . dextrose 5 % solution    Intravenous Continuous Jeanella Craze, NP 75 mL/hr at 06/23/14 0019    . enoxaparin (LOVENOX) injection 40 mg  40 mg Subcutaneous Q24H Randall K Absher, RPH   40 mg at 06/22/14 1327  . insulin aspart (novoLOG) injection 0-15 Units  0-15 Units Subcutaneous 6 times per day Coralyn Helling, MD   2 Units at 06/23/14 0016  . levothyroxine (SYNTHROID, LEVOTHROID) tablet 150 mcg  150 mcg Per Tube QAC breakfast Coralyn Helling, MD   150 mcg at 06/23/14 0900  . metroNIDAZOLE (FLAGYL) IVPB 500 mg  500 mg Intravenous 3 times per day Randall Hiss, MD   500 mg at 06/23/14 0504  . ondansetron (ZOFRAN) injection 4 mg  4 mg Intravenous Q6H PRN Osvaldo Shipper, MD      . pantoprazole sodium (PROTONIX) 40 mg/20  mL oral suspension 40 mg  40 mg Per Tube Q24H Coralyn Helling, MD   40 mg at 06/22/14 1323  . RESOURCE THICKENUP CLEAR   Oral PRN Jeanella Craze, NP      . simvastatin (ZOCOR) tablet 20 mg  20 mg Per Tube QHS Coralyn Helling, MD   20 mg at 06/22/14 2229  . sodium chloride 0.9 % injection 3 mL  3 mL Intravenous Q12H Osvaldo Shipper, MD   3 mL at 06/22/14 2229  . thiamine (VITAMIN B-1) tablet 100 mg  100 mg Per Tube Daily Coralyn Helling, MD   100 mg at 06/23/14 0932  . vancomycin (VANCOCIN) IVPB 750 mg/150 ml premix  750 mg Intravenous Q24H Maurice March, RPH   750 mg at 06/22/14 1322    Musculoskeletal: Strength & Muscle Tone: decreased Gait & Station: unable to stand Patient leans: N/A  Psychiatric Specialty Exam: Physical Exam as per history and physical  ROS depression, anxiety, confusion and mild paranoia  Blood pressure 104/74, pulse 83, temperature 98.4 F (36.9 C), temperature source Oral, resp. rate 16, height  (1.727 m), weight 76.1 kg (167 lb 12.3 oz), SpO2 93 %.Body mass index is 25.52 kg/(m^2).  General Appearance: Guarded  Eye Contact::  Good  Speech:  Clear and Coherent and Slow  Volume:  Decreased  Mood:  Anxious and Depressed  Affect:  Congruent and Depressed  Thought Process:  Coherent  and Goal Directed  Orientation:  Full (Time, Place, and Person)  Thought Content:  Paranoid Ideation  Suicidal Thoughts:  No  Homicidal Thoughts:  No  Memory:  Immediate;   Fair Recent;   Fair  Judgement:  Intact  Insight:  Fair  Psychomotor Activity:  Decreased  Concentration:  Fair  Recall:  [employer] of Knowledge:Fair  Language: Good  Akathisia:  NA  Handed:  Right  AIMS (if indicated):     Assets:  Communication Skills Desire for Improvement Financial Resources/Insurance Housing Intimacy Leisure Time Resilience Social Support Talents/Skills Transportation  ADL's:  Impaired  Cognition: Impaired,  Mild  Sleep:      Medical Decision Making: Established Problem, Stable/Improving (1), Review of Psycho-Social Stressors (1), Decision to obtain old records (1), Review and summation of old records (2), New Problem, with no additional work-up planned (3), Review or order medicine tests (1), Review of Medication Regimen & Side Effects (2) and Review of New Medication or Change in Dosage (2)  Problem Points: Established problem, stable/improving (1), New problem, with no additional work-up planned (3) and Review of psycho-social stressors (1)  Data Points: Review or order clinical lab tests (1) Review of medication regiment & side effects (2)  Treatment Plan Summary: Daily contact with patient to assess and evaluate symptoms and progress in treatment and Medication management  Plan: Will start Lithobid 300 mg PO BID for bipolar disorder with patient consent No evidence of imminent risk to self or others at present.   Patient does not meet criteria for psychiatric inpatient admission.   Disposition: she will return to ALF when medically stable and follow up with psychiatrist at [facility] as a out patient.   Destinae Neubecker,JANARDHAHA R. 06/23/2014 9:45 AM

## 2014-06-23 NOTE — Procedures (Addendum)
Objective Swallowing Evaluation: Modified Barium Swallowing Study  Patient Details  Name: [name] [name] MRN: [id number] Date of Birth: 1940/12/13  Today's Date: 06/23/2014 Time: SLP Start Time (ACUTE ONLY): 1230-SLP Stop Time (ACUTE ONLY): 1305 SLP Time Calculation (min) (ACUTE ONLY): 35 min  Past Medical History:  Past Medical History  Diagnosis Date  . Hypertension   . Bipolar 1 disorder   . Hypothyroidism   . Memory difficulties   . HLD (hyperlipidemia)   . Diverticulitis large intestine   . Pneumonia    Past Surgical History:  Past Surgical History  Procedure Laterality Date  . No past surgeries     HPI:  HPI: 74 yo female adm to [facility] with fever and AMS. Pt found to have recurrent pna- CXR showed increased bilateral opacities - ATX.  During hospital stay, pt became lethargic and had agonal respirations - code blue called and pt received CPR and was intubated on 1/15-1/19.  Swallow evaluation ordered s/p extubation.  Pt had a clinical swallow evaluation during previous admission and was placed on dys3/nectar diet.    No Data Recorded  Assessment / Plan / Recommendation CHL IP CLINICAL IMPRESSIONS 06/23/2014  Dysphagia Diagnosis Moderate oral phase dysphagia;Moderate pharyngeal phase dysphagia  Clinical impression Pt with moderate oropharyngeal dysphagia *suspect esophageal component as well. Poor oral control due to weakness, lingual thrusting results in delayed transiting, anterior spillage of liquids and residuals after swallow across consistencies. Pharyngeal swallow characterized by mildly delayed swallow with laryngeal penetration/trace silent aspiration of thin liquids as barium spilled into open larynx prior to swallow. Pt also appears with decreased clearance of esophagus distal to proximal region without pt awareness/sensation. (proximal esophagus appeared dilated) Water via tsp consumption facilitated clearance but with appearance of backflow. Radiologist not present  to confirm findings. Although pt did not aspirate with nectar, puree or cracker she is chronic aspiration risk due to suspected multifactorial dysphagia, gross weakness and multiple co-morbidities. Pt reports she prefers nectar thickened liquid as she coughs less with nectar vs thin.MD may desire to continue liquids only (? Full vs clears)- nectar thickened with accepted aspiration risks and strategies to mitigate risk.   Using live video, provided pt with compensation strategies.        CHL IP TREATMENT RECOMMENDATION 06/23/2014  Treatment Plan Recommendations F/U MBS in --- days (Comment)     CHL IP DIET RECOMMENDATION 06/23/2014  Diet Recommendations Nectar-thick liquid  Liquid Administration via Cup;Spoon;No straw  Medication Administration Whole meds with puree  Compensations Slow rate;Small sips/bites  Postural Changes and/or Swallow Maneuvers Seated upright 90 degrees;Upright 30-60 min after meal     CHL IP OTHER RECOMMENDATIONS 06/23/2014  Recommended Consults (None)  Oral Care Recommendations Oral care BID  Other Recommendations Order thickener from pharmacy;Prohibited food (jello, ice cream, thin soups)     CHL IP FOLLOW UP RECOMMENDATIONS 06/23/2014  Follow up Recommendations Skilled Nursing facility     [facility] IP FREQUENCY AND DURATION 06/23/2014  Speech Therapy Frequency (ACUTE ONLY) min 2x/week  Treatment Duration 2 weeks     Pertinent Vitals/Pain Afebrile, decreased    SLP Swallow Goals No flowsheet data found.  No flowsheet data found.    CHL IP REASON FOR REFERRAL 06/23/2014  Reason for Referral Objectively evaluate swallowing function     CHL IP ORAL PHASE 06/23/2014  Lips (None)  Tongue (None)  Mucous membranes (None)  Nutritional status (None)  Other (None)  Oxygen therapy (None)  Oral Phase Impaired  Oral - Pudding Teaspoon (None)  Oral -  Pudding Cup (None)  Oral - Honey Teaspoon (None)  Oral - Honey Cup (None)  Oral - Honey Syringe (None)  Oral -  Nectar Teaspoon Right anterior bolus loss;Reduced posterior propulsion;Piecemeal swallowing;Lingual/palatal residue;Delayed oral transit;Lingual pumping;Incomplete tongue to palate contact  Oral - Nectar Cup Weak lingual manipulation;Delayed oral transit;Right anterior bolus loss;Reduced posterior propulsion;Piecemeal swallowing;Lingual/palatal residue;Lingual pumping;Incomplete tongue to palate contact  Oral - Nectar Straw Right anterior bolus loss;Reduced posterior propulsion;Piecemeal swallowing;Lingual/palatal residue;Delayed oral transit;Lingual pumping;Incomplete tongue to palate contact  Oral - Nectar Syringe (None)  Oral - Ice Chips (None)  Oral - Thin Teaspoon Reduced posterior propulsion;Right anterior bolus loss;Piecemeal swallowing;Lingual/palatal residue;Delayed oral transit;Lingual pumping;Incomplete tongue to palate contact  Oral - Thin Cup Weak lingual manipulation;Delayed oral transit;Reduced posterior propulsion;Right anterior bolus loss;Piecemeal swallowing;Lingual/palatal residue;Lingual pumping;Incomplete tongue to palate contact  Oral - Thin Straw Weak lingual manipulation;Delayed oral transit;Right anterior bolus loss;Reduced posterior propulsion;Piecemeal swallowing;Lingual/palatal residue;Lingual pumping;Incomplete tongue to palate contact  Oral - Thin Syringe (None)  Oral - Puree Weak lingual manipulation;Delayed oral transit;Piecemeal swallowing;Lingual/palatal residue;Lingual pumping;Incomplete tongue to palate contact;Reduced posterior propulsion  Oral - Mechanical Soft Weak lingual manipulation;Delayed oral transit;Piecemeal swallowing;Lingual/palatal residue;Lingual pumping;Incomplete tongue to palate contact;Reduced posterior propulsion;Impaired mastication  Oral - Regular (None)  Oral - Multi-consistency (None)  Oral - Pill Weak lingual manipulation;Delayed oral transit;Piecemeal swallowing;Lingual/palatal residue;Lingual pumping;Reduced posterior propulsion  Oral Phase  - Comment cued dry swallows decrease residuals      CHL IP PHARYNGEAL PHASE 06/23/2014  Pharyngeal Phase Impaired  Pharyngeal - Pudding Teaspoon (None)  Penetration/Aspiration details (pudding teaspoon) (None)  Pharyngeal - Pudding Cup (None)  Penetration/Aspiration details (pudding cup) (None)  Pharyngeal - Honey Teaspoon (None)  Penetration/Aspiration details (honey teaspoon) (None)  Pharyngeal - Honey Cup (None)  Penetration/Aspiration details (honey cup) (None)  Pharyngeal - Honey Syringe (None)  Penetration/Aspiration details (honey syringe) (None)  Pharyngeal - Nectar Teaspoon Reduced pharyngeal peristalsis  Penetration/Aspiration details (nectar teaspoon) (None)  Pharyngeal - Nectar Cup Reduced pharyngeal peristalsis;Penetration/Aspiration before swallow  Penetration/Aspiration details (nectar cup) Material enters airway, remains ABOVE vocal cords then ejected out  Pharyngeal - Nectar Straw Reduced pharyngeal peristalsis;Penetration/Aspiration before swallow  Penetration/Aspiration details (nectar straw) Material enters airway, remains ABOVE vocal cords then ejected out  Pharyngeal - Nectar Syringe (None)  Penetration/Aspiration details (nectar syringe) (None)  Pharyngeal - Ice Chips (None)  Penetration/Aspiration details (ice chips) (None)  Pharyngeal - Thin Teaspoon Reduced pharyngeal peristalsis;Pharyngeal residue - valleculae  Penetration/Aspiration details (thin teaspoon) (None)  Pharyngeal - Thin Cup Reduced pharyngeal peristalsis;Penetration/Aspiration before swallow  Penetration/Aspiration details (thin cup) Material enters airway, passes BELOW cords without attempt by patient to eject out (silent aspiration)  Pharyngeal - Thin Straw Reduced pharyngeal peristalsis;Trace aspiration;Penetration/Aspiration before swallow  Penetration/Aspiration details (thin straw) Material enters airway, passes BELOW cords without attempt by patient to eject out (silent aspiration)   Pharyngeal - Thin Syringe (None)  Penetration/Aspiration details (thin syringe') (None)  Pharyngeal - Puree Reduced pharyngeal peristalsis;Pharyngeal residue - valleculae  Penetration/Aspiration details (puree) (None)  Pharyngeal - Mechanical Soft (None)  Penetration/Aspiration details (mechanical soft) (None)  Pharyngeal - Regular Reduced pharyngeal peristalsis  Penetration/Aspiration details (regular) (None)  Pharyngeal - Multi-consistency (None)  Penetration/Aspiration details (multi-consistency) (None)  Pharyngeal - Pill NT  Penetration/Aspiration details (pill) (None)  Pharyngeal Comment cued dry swallows decrease residuals     CHL IP CERVICAL ESOPHAGEAL PHASE 06/23/2014  Cervical Esophageal Phase Impaired  Pudding Teaspoon (None)  Pudding Cup (None)  Honey Teaspoon (None)  Honey Cup (None)  Honey Syringe (None)  Nectar Teaspoon (None)  Nectar  Cup (None)  Nectar Straw (None)  Nectar Syringe (None)  Thin Teaspoon (None)  Thin Cup (None)  Thin Straw (None)  Thin Syringe (None)  Cervical Esophageal Comment appearance of    No flowsheet data found.         Donavan Burnet, MS [facility] SLP [phone]

## 2014-06-23 NOTE — Progress Notes (Signed)
Reviewed chart and noted that pt expectoration of mucous plug yesterday after intake of nectar thickened juice.  SLP recommends MBS today!  Please order if you agree to allow SLP to completed late am as radiology schedule allows.  Donavan Burnetamara Phyillis Dascoli, MS [facility] SLP [phone]

## 2014-06-23 NOTE — Progress Notes (Signed)
Clinical Social Work Department CLINICAL SOCIAL WORK PSYCHIATRY SERVICE LINE ASSESSMENT 06/23/2014  Patient:  [name] [name]  Account:  [id number]  Admit Date:  06/16/2014  Clinical Social Worker:  Unk Lightning, LCSW  Date/Time:  06/23/2014 10:00 AM Referred by:  Physician  Date referred:  06/23/2014 Reason for Referral  Psychosocial assessment   Presenting Symptoms/Problems (In the person's/family's own words):   Psych consulted for medication management.   Abuse/Neglect/Trauma History (check all that apply)  Denies history   Abuse/Neglect/Trauma Comments:   Psychiatric History (check all that apply)  Outpatient treatment   Psychiatric medications:  Patient's psychotropic medication was discontinued during hospitalization. Patient was prescribed Cogentin, Tegretol, and Ativan.   Current Mental Health Hospitalizations/Previous Mental Health History:   Patient reports she was diagnosed with bipolar disorder several years ago. Patient used to receive ACT services through [facility] but is now a long term resident at [facility] and reports MD and psychiatrist through SNF prescribe medications currently.   Current provider:   [name]   Place and Date:   [place], [place]   Current Medications:   Scheduled Meds:      . acetylcysteine  3 mL Nebulization TID  . albuterol  2.5 mg Nebulization Q6H  . antiseptic oral rinse  7 mL Mouth Rinse q12n4p  . aspirin  81 mg Per Tube Daily  . ceFEPime (MAXIPIME) IV  1 g Intravenous Q24H  . chlorhexidine  15 mL Mouth Rinse BID  . enoxaparin (LOVENOX) injection  40 mg Subcutaneous Q24H  . insulin aspart  0-15 Units Subcutaneous 6 times per day  . levothyroxine  150 mcg Per Tube QAC breakfast  . metronidazole  500 mg Intravenous 3 times per day  . pantoprazole sodium  40 mg Per Tube Q24H  . simvastatin  20 mg Per Tube QHS  . sodium chloride  3 mL Intravenous Q12H  . thiamine  100 mg Per Tube Daily  . vancomycin  750 mg Intravenous  Q24H        Continuous Infusions:      . dextrose 75 mL/hr at 06/23/14 0019          PRN Meds:.acetaminophen (TYLENOL) oral liquid 160 mg/5 mL, albuterol, [DISCONTINUED] ondansetron **OR** ondansetron (ZOFRAN) IV, RESOURCE THICKENUP CLEAR       Previous Impatient Admission/Date/Reason:   None reported   Emotional Health / Current Symptoms    Suicide/Self Harm  None reported   Suicide attempt in the past:   Patient denies any SI or HI.   Other harmful behavior:   None reported   Psychotic/Dissociative Symptoms  Visual Hallucinations   Other Psychotic/Dissociative Symptoms:   Patient reports chronic VH but  reports no AH. Patient reports that her hallucinations are often of animals and describes it as "seeing God's playground."    Attention/Behavioral Symptoms  Within Normal Limits   Other Attention / Behavioral Symptoms:   Patient engaged during assessment.    Cognitive Impairment  Within Normal Limits   Other Cognitive Impairment:   Patient alert and oriented during assessment.    Mood and Adjustment  Mood Congruent    Stress, Anxiety, Trauma, Any Recent Loss/Stressor  None reported   Anxiety (frequency):   N/A   Phobia (specify):   N/A   Compulsive behavior (specify):   N/A   Obsessive behavior (specify):   N/A   Other:   N/A   Substance Abuse/Use  None   SBIRT completed (please refer for detailed history):  N  Self-reported substance use:  Patient denies any substance use.   Urinary Drug Screen Completed:  N Alcohol level:   N/A    Environmental/Housing/Living Arrangement  SKILLED NURSING FACILITY   Who is in the home:   [name]   Emergency contact:  Ria CommentMatthew-son   Financial  Medicare  Medicaid   Patient's Strengths and Goals (patient's own words):   Patient reports supportive family. Patient has stable housing.   Clinical Social Worker's Interpretive Summary:   CSW received referral in order to complete psychosocial  assessment. CSW and psych MD rounded on patient. CSW introduced myself and explained role.    Patient reports she is currently living in SNF and that she has a son and dtr that are involved with her care. Patient reports she was living at home but recently [facility]. Patient reports several years ago she was diagnosed with bipolar disorder because she was experiencing VH, racing thoughts, and having impulsive behavior (such as spending money when she did not have it). Patient reports she was receiving ACT services through [facility] prior to moving into SNF but that now SNF is responsible for prescribing medications. Patient able to identify which medications she was prescribed and discussed medication recommendations that were provided by psych MD.    Psych MD to review medication list and make recommendations and adjustments as needed. CSW will continue to follow to assist as needed.   Disposition:  Recommend Psych CSW continuing to support while in hospital  Blackwells MillsHolly Aldean [place], [place] [phone]

## 2014-06-23 NOTE — Progress Notes (Signed)
NUTRITION FOLLOW-UP       INTERVENTION: Diet advancement per MD Provide Resource Breeze po TID, each supplement provides 250 kcal and 9 grams of protein, thickened to nectar consistency RD to continue to monitor  NUTRITION DIAGNOSIS: Inadequate oral intake related to inability to eat as evidenced by NPO status, now evidenced by CL diet  Goal: Pt to meet >/= 90% of their estimated nutrition needs, unmet   Monitor:  PO intake, weight, labs, I/O's  ASSESSMENT: 74 y.o. female who lives in a skilled nursing facility and has a history of hypertension, bipolar disorder, hypothyroidism, who was the in this hospital back in November and was treated for pneumonia. In the emergency department she was found to have a rectal temperature of 102F.   Pt extubated on 1/19. Pt placed on CL diet with nectar thick liquids.   Per SLP, MBS showed pt with moderate dysphagia, pt is st chronic risk for aspiration. Was on a Dys 3/nectar diet at SNF PTA.  Pt's weight is -7 lb since initial assessment.  Spoke with pt after MBS, feels hungry. States that she is willing to try [employer] with clear liquids. RD to order.  Labs reviewed: Elevated Na, BUN & Creatinine Phos low Mg WNL  Height: Ht Readings from Last 1 Encounters:  06/18/14  (1.727 m)    Weight: Wt Readings from Last 1 Encounters:  06/23/14 167 lb 12.3 oz (76.1 kg)  06/20/14 174 lb  BMI:  Body mass index is 25.52 kg/(m^2).  Re-estimated Nutritional Needs: Kcal: 1800-2000 Protein: 90-100g Fluid: 1.8L/day  Skin: Intact, +1 RLE, +1 LLE edema, +1 generalized edema  Diet Order: Diet clear liquid  EDUCATION NEEDS: -No education needs identified at this time   Intake/Output Summary (Last 24 hours) at 06/23/14 0947 Last data filed at 06/23/14 0900  Gross per 24 hour  Intake 3075.5 ml  Output   1500 ml  Net 1575.5 ml    Last BM: PTA  Labs:   Recent Labs Lab 06/18/14 0508  06/19/14 0400  06/20/14 0500   06/21/14 0550 06/22/14 0420 06/23/14 0500  NA 168*  < > 150*  < >  --   < > 159* 152* 152*  K 3.8  < > 3.2*  < >  --   < > 3.7 4.0 3.5  CL >130*  < > 127*  < >  --   < > 128* 121* 119*  CO2 25  < > 23  < >  --   < > BUN 30*  < > 33*  < >  --   < > 37* 40* 36*  CREATININE 1.98*  < > 1.94*  < >  --   < > 1.37* 1.31* 1.14*  CALCIUM 10.1  < > 8.3*  < >  --   < > 8.9 9.0 8.9  MG 2.6*  --  2.2  --  2.0  --   --   --   --   PHOS 3.3  --   --   --  2.2*  --   --   --   --   GLUCOSE 162*  < > 206*  < >  --   < > 149* 268* 121*  < > = values in this interval not displayed.  CBG (last 3)   Recent Labs  06/22/14 1936 06/23/14 0003 06/23/14 0431  GLUCAP 144* 121* 107*    Scheduled Meds: . acetylcysteine  3 mL  Nebulization TID  . albuterol  2.5 mg Nebulization Q6H  . antiseptic oral rinse  7 mL Mouth Rinse q12n4p  . aspirin  81 mg Per Tube Daily  . ceFEPime (MAXIPIME) IV  1 g Intravenous Q24H  . chlorhexidine  15 mL Mouth Rinse BID  . enoxaparin (LOVENOX) injection  40 mg Subcutaneous Q24H  . insulin aspart  0-15 Units Subcutaneous 6 times per day  . levothyroxine  150 mcg Per Tube QAC breakfast  . metronidazole  500 mg Intravenous 3 times per day  . pantoprazole sodium  40 mg Per Tube Q24H  . simvastatin  20 mg Per Tube QHS  . sodium chloride  3 mL Intravenous Q12H  . thiamine  100 mg Per Tube Daily  . vancomycin  750 mg Intravenous Q24H    Continuous Infusions: . dextrose 75 mL/hr at 06/23/14 0019    [name] FrancoLindsey Jessia Kief, MS, RD, LDN Pager: [phone] After Hours Pager: [phone]

## 2014-06-23 NOTE — Progress Notes (Signed)
MBS completed, full report to follow.  Pt with moderate oropharyngeal dysphagia *suspect esophageal component as well.  Poor oral control due to weakness results in delayed transiting, anterior spillage of liquids and residuals after swallow across consistencies.  Pharyngeal swallow characterized by mildly delayed swallow with laryngeal penetration/trace silent aspiration of thin liquids as barium spilled into open larynx prior to swallow.  Pt also appears with decreased clearance of esophagus distal to proximal region without pt awareness/sensation.  (proximal esophagus appeared dilated) Water via tsp consumption facilitated clearance but with appearance of backflow.  Radiologist not present to confirm findings.    Although pt did not aspirate with nectar, puree or cracker she is chronic aspiration risk due to suspected multifactorial dysphagia, gross weakness and multiple co-morbidities.    Pt reports she prefers nectar thickened liquid as she coughs less with nectar vs thin.   MD may desire to continue liquids only (? Full vs clears)- nectar thickened with accepted aspiration risks and strategies to mitigate risk.     Will follow up to aid in dysphagia management.   [name] Burnetamara Marquet Faircloth, MS [facility] SLP [phone]

## 2014-06-23 NOTE — Progress Notes (Signed)
ANTIBIOTIC CONSULT NOTE - follow up  Pharmacy Consult for cefepime, vancomycin Indication: HCAP  Allergies  Allergen Reactions  . Antihistamine Decongestant [Triprolidine-Pse] Diarrhea  . Codeine Other (See Comments)    Gi upset     Patient Measurements: Height: 5\' 8"  (172.7 cm) Weight: 167 lb 12.3 oz (76.1 kg) IBW/kg (Calculated) : 63.9  Vital Signs: Temp: 98.4 F (36.9 C) (01/20 0800) Temp Source: Oral (01/20 0800) BP: 138/73 mmHg (01/20 1200) Pulse Rate: 72 (01/20 1200) Intake/Output from previous day: 01/19 0701 - 01/20 0700 In: 3205.5 [I.V.:1885.5; NG/GT:30; IV Piggyback:500] Out: 1300 [Urine:1300]  Labs:  Recent Labs  06/21/14 0550 06/22/14 0420 06/23/14 0500  WBC 9.5 9.0 8.0  HGB 10.0* 9.8* 9.6*  PLT 100* 110* 139*  CREATININE 1.37* 1.31* 1.14*   Estimated Creatinine Clearance: 43.7 mL/min (by C-G formula based on Cr of 1.14). No results for input(s): VANCOTROUGH, VANCOPEAK, VANCORANDOM, GENTTROUGH, GENTPEAK, GENTRANDOM, TOBRATROUGH, TOBRAPEAK, TOBRARND, AMIKACINPEAK, AMIKACINTROU, AMIKACIN in the last 72 hours.    Microbiology: 1/13 blood x 2: NGF 1/13 CSF: NGF  1/13 urine: NGF 1/13 flu panel: neg 1/13 MRSA PCR screen: positive 1/16 Sputum: Few Candida 1/17 blood x2: ngtd   Anti-infectives: 1/13 >> cefepime >> 1/13 >> vancomycin >> 1/16 >> metronidazole >>  Serum levels and dosage adjustments: 1/15: Vancomycin reduced from 1g q24h to 750mg  q24h and cefepime from 1g q12h to 1 gram q24h based upon renal function 1/17: VT 17.7 on 750mg  q24h - continue present dosage.  Assessment: 74 y/o F admitted through ED from rehab facility 1/13 with fever and AMS.  CXR showed unchanged mild subsegmental atelectasis or scar RLL.  Orders received to begin cefepime and vancomycin with pharmacy dosing assistance for r/o HCAP.   Azotemia noted.  Per [facility] records, baseline SCr ~ 1.2.  Today, 06/23/2014:  D#8 Cefepime and Vancomycin  D#5 metronidazole  Tmax:  100.3  WBCs: remains WNL  Renal:  SCr improving 1.14, CrCl 41 ml/min  Goal of Therapy:  Appropriate antibiotic dosing for indication and renal function; eradication of infection. Vancomycin trough 15-20  Plan: 1. Continue vancomycin 750mg  IV q24h 2. Continue cefepime 1gm q24h 3. Continue metronidazole as ordered by ID (500mg  IV q8h) 4. Follow serum creatinine, cultures, clinical course. 5.  Repeat vancomycin trough level if therapy continues.  Lynann Beaverhristine Dachelle Molzahn PharmD, BCPS Pager [phone] 06/23/2014 12:41 PM

## 2014-06-23 NOTE — Progress Notes (Signed)
 Name: [name] [name] MRN: [id number] DOB: 02/13/1941    ADMISSION DATE:  06/16/2014  REFERRING MD :  Dr. Ranga  CHIEF COMPLAINT:  AMS / Fever   BRIEF PATIENT DESCRIPTION:  74 yo female from SNF with fever (Tm 102F) and altered mental status.  Transferred to ICU with symptomatic bradycardia, VDRF.  SIGNIFICANT EVENTS  1/13  Admit with fever / AMS 1/15  ID consulted 1/15  Bradycardic arrest likely from respiratory failure, VDRF, to ICU 1/18  more awake alert, no ETT cuff leak > decadron 1/19  Extubated without difficulty to [place] O2  STUDIES:  1/13  CT Head >>  Negative  1/13  LP >> RBC 39, WBC 1 1/14  CT chest >> atherosclerosis 1/14  CT abd/pelvis >> CBD 10 mm, diverticulosis 1/15  MRI brain >> mild atrophy and small vessel disease 1/15  Echo >> mild LVH, EF 65 to 70%, grade 1 diastolic dysfx   SUBJECTIVE:  No distress, patient alert awake.  Denies acute complaints.  Asking for coffee.   VITAL SIGNS: Temp:  [98 F (36.7 C)-98.9 F (37.2 C)] 98.7 F (37.1 C) (01/20 0454) Pulse Rate:  [69-86] 76 (01/20 0800) Resp:  [14-22] 14 (01/20 0800) BP: (97-134)/(54-74) 110/62 mmHg (01/20 0800) SpO2:  [90 %-98 %] 94 % (01/20 0800) Weight:  [167 lb 12.3 oz (76.1 kg)] 167 lb 12.3 oz (76.1 kg) (01/20 0454)  PHYSICAL EXAMINATION:  Gen: elderly female in NAD HEENT: MM pink/moist, no lesions/exudate, no jvd PULM: CTA B CV: RRR, no mgr AB: BS+, soft, non-tender Ext: warm, trace edema Neuro: Awake/alert, speech clear, follows commands, MAE/generalized weakness   CBC Latest Ref Rng 06/23/2014 06/22/2014 06/21/2014  WBC 4.0 - 10.5 K/uL 8.0 9.0 9.5  Hemoglobin 12.0 - 15.0 g/dL 9.6(L) 9.8(L) 10.0(L)  Hematocrit 36.0 - 46.0 % 32.3(L) 33.6(L) 33.4(L)  Platelets 150 - 400 K/uL 139(L) 110(L) 100(L)    BMP Latest Ref Rng 06/23/2014 06/22/2014 06/21/2014  Glucose 70 - 99 mg/dL 121(H) 268(H) 149(H)  BUN 6 - 23 mg/dL 36(H) 40(H) 37(H)  Creatinine 0.50 - 1.10 mg/dL 1.14(H) 1.31(H) 1.37(H)    Sodium 135 - 145 mmol/L 152(H) 152(H) 159(H)  Potassium 3.5 - 5.1 mmol/L 3.5 4.0 3.7  Chloride 96 - 112 mEq/L 119(H) 121(H) 128(H)  CO2 19 - 32 mmol/L 27 23 25  Calcium 8.4 - 10.5 mg/dL 8.9 9.0 8.9    Hepatic Function Latest Ref Rng 06/20/2014 06/18/2014 06/18/2014  Total Protein 6.0 - 8.3 g/dL 4.9(L) 6.2 6.6  Albumin 3.5 - 5.2 g/dL 2.5(L) 3.1(L) 3.5  AST 0 - 37 U/L 27 70(H) 35  ALT 0 - 35 U/L 31 56(H) 37(H)  Alk Phosphatase 39 - 117 U/L 52 83 88  Total Bilirubin 0.3 - 1.2 mg/dL 0.7 0.7 0.5  Bilirubin, Direct 0.0 - 0.3 mg/dL <0.1 - -    CBG (last 3)   Recent Labs  06/22/14 1936 06/23/14 0003 06/23/14 0431  GLUCAP 144* 121* 107*    No results found.   ASSESSMENT / PLAN:  PULMONARY ETT 1/15 >> 1/19 A: Acute respiratory failure 2nd to recurrent aspiration PNA > resolved No cuff leak 1/18 > given decadron P: Wean O2 as needed for saturations >92% Pulmonary hygiene:  Mobilze, IS Albuterol Q6 D/C time added to mucomyst   CARDIAC Lt IJ CVL 1/15 >> A: Symptomatic bradycardia from respiratory failure, resolved Demand ischemia in setting of respiratory failure Hx of HTN P: Tele monitoring Continue ASA Continue zocor Hold outpt norvasc for now    RENAL A: Hypernatremia with polyuria>> persistant, Li related, nephrogenic DI? Hyperchloremia  AKI 2nd to volume depletion > improving P: Continue IV fluid to D5W at 75 ml/hr Hold Lithium as long as possible, free water restriction test not feasible right now (she is benefiting from receiving free water) Monitor BMET and UOP Replace electrolytes as needed   GASTROENTEROLOGY A: Dysphagia P: Appreciate SLP Pending MBS Protonix for SUP  HEMATOLOGY A: Mild anemia Thrombocytopenia P: F/u CBC Lovenox for DVT prevention  INFECTION A: Sepsis 2nd to recurrent aspiration PNA > resolved P: Day 8 vancomycin, cefepime  Day 5 flagyl Abx per ID, appreciate input  Blood 1/13 >> neg CSF 1/13 >> neg Sputum 1/16 >>  neg  ENDOCRINE A: Hx of hypothyroidism Hyperglycemia P: Continue levothyroxine SSI  NEUROLOGY A: Acute encephalopathy 2nd to sepsis, renal failure, hypernatremia >> improved 1/19 Hx of Bipolar 1 disorder. P: Hold outpt cogentin, tegretol, lithium, restoril, tramadol Psych evaluation called 1/20 to assist with bipolar regimen  Summary: She likely had recurrent aspiration PNA leading to respiratory and then cardiac arrest, now resolved.  Tolerated extubation 1/19 without difficulty.  PSY consulted for medication assistance / evaluation.  SLP MBS pending.  Tolerating nectar thick liquids.  Transfer primary SVC to TRH in am 1/21 0700.    Brandi Ollis, NP-C [facility] Pgr: [phone]   Attending:  I have seen and examined the patient with nurse practitioner/resident and agree with the note above.   On exam her lungs are clear She tolerated extubation well Mental status continues to improve  Abx per ID Continue D5  Move to floor, TRH service  Brent , MD [place] [facility] Pager: [phone] Cell: [phone] If no response, call [phone]   

## 2014-06-23 NOTE — Progress Notes (Signed)
Patient ID: [name] [name], female   DOB: Dec 17, 1940, 74 y.o.   MRN: [id number]         [facility]    Date of Admission:  06/16/2014           Day 8 vancomycin        Day 8 cefepime        Day 4 metronidazole  Principal Problem:   Fever Active Problems:   Acute kidney injury   Acute encephalopathy   Hypernatremia   Dehydration   Sepsis   Elevated troponin   Transaminitis   Bipolar 1 disorder   Essential hypertension   Hypothyroid   Macrocytosis without anemia   Atherosclerotic peripheral vascular disease   Altered mental state   Aspiration pneumonitis   FUO (fever of unknown origin)   Encephalopathy   . acetylcysteine  3 mL Nebulization TID  . albuterol  2.5 mg Nebulization Q6H  . antiseptic oral rinse  7 mL Mouth Rinse q12n4p  . aspirin  81 mg Per Tube Daily  . ceFEPime (MAXIPIME) IV  1 g Intravenous Q24H  . chlorhexidine  15 mL Mouth Rinse BID  . enoxaparin (LOVENOX) injection  40 mg Subcutaneous Q24H  . feeding supplement (RESOURCE BREEZE)  1 Container Oral TID BM  . insulin aspart  0-15 Units Subcutaneous 6 times per day  . levothyroxine  150 mcg Per Tube QAC breakfast  . lithium carbonate  300 mg Oral Q12H  . metronidazole  500 mg Intravenous 3 times per day  . pantoprazole sodium  40 mg Per Tube Q24H  . simvastatin  20 mg Per Tube QHS  . sodium chloride  3 mL Intravenous Q12H  . thiamine  100 mg Per Tube Daily  . vancomycin  750 mg Intravenous Q24H   SUBJECTIVE: She is asking when she can have some food. Her episode of choking yesterday when taking liquids and coughed up a large mucous plug.  OBJECTIVE: Blood pressure 138/73, pulse 72, temperature 98.4 F (36.9 C), temperature source Oral, resp. rate 16, height 5\' 8"  (1.727 m), weight 167 lb 12.3 oz (76.1 kg), SpO2 93 %.  General: She is much more alert and talkative Skin: No rash Lungs: Clear anteriorly Cor: Regular S1 and S2 with an early 1/6 systolic murmur Abdomen:  Soft and not obviously tender  Lab Results Lab Results  Component Value Date   WBC 8.0 06/23/2014   HGB 9.6* 06/23/2014   HCT 32.3* 06/23/2014   MCV 103.5* 06/23/2014   PLT 139* 06/23/2014    Lab Results  Component Value Date   CREATININE 1.14* 06/23/2014   BUN 36* 06/23/2014   NA 152* 06/23/2014   K 3.5 06/23/2014   CL 119* 06/23/2014   CO2 27 06/23/2014    Lab Results  Component Value Date   ALT 31 06/20/2014   AST 27 06/20/2014   ALKPHOS 52 06/20/2014   BILITOT 0.7 06/20/2014     Microbiology: Recent Results (from the past 240 hour(s))  Urine culture     Status: None   Collection Time: 06/16/14 11:38 AM  Result Value Ref Range Status   Specimen Description URINE, CATHETERIZED  Final   Special Requests NONE  Final   Colony Count NO GROWTH [facility]   Final   Culture NO GROWTH Performed at [facility]   Final   Report Status 06/17/2014 FINAL  Final  Culture, blood (routine x 2)     Status: None  Collection Time: 06/16/14 11:53 AM  Result Value Ref Range Status   Specimen Description BLOOD RIGHT HAND  Final   Special Requests BOTTLES DRAWN AEROBIC AND ANAEROBIC 5CC  Final   Culture   Final    NO GROWTH 5 DAYS Performed at [facility]    Report Status 06/22/2014 FINAL  Final  Culture, blood (routine x 2)     Status: None   Collection Time: 06/16/14 11:53 AM  Result Value Ref Range Status   Specimen Description BLOOD LEFT HAND  Final   Special Requests BOTTLES DRAWN AEROBIC AND ANAEROBIC 4CC  Final   Culture   Final    NO GROWTH 5 DAYS Performed at [facility]    Report Status 06/22/2014 FINAL  Final  Gram stain     Status: None   Collection Time: 06/16/14  5:07 PM  Result Value Ref Range Status   Specimen Description CSF  Final   Special Requests NONE  Final   Gram Stain   Final    NO ORGANISMS SEEN NO WBC SEEN CYTOSPIN Gram Stain Report Called to,Read Back By and Verified With: ALDRIDGE,D. RN   ON 1.13.16 BY MCCOY,N.    Report Status 06/16/2014 FINAL  Final  CSF culture     Status: None   Collection Time: 06/16/14  5:07 PM  Result Value Ref Range Status   Specimen Description CSF  Final   Special Requests NONE  Final   Gram Stain   Final    NO WBC SEEN NO ORGANISMS SEEN Performed at [facility]    Culture   Final    NO GROWTH 3 DAYS Performed at [facility]    Report Status 06/20/2014 FINAL  Final  MRSA PCR Screening     Status: Abnormal   Collection Time: 06/16/14  5:19 PM  Result Value Ref Range Status   MRSA by PCR POSITIVE (A) NEGATIVE Final    Comment:        The GeneXpert MRSA Assay (FDA approved for NASAL specimens only), is one component of a comprehensive MRSA colonization surveillance program. It is not intended to diagnose MRSA infection nor to guide or monitor treatment for MRSA infections. RESULT CALLED TO, READ BACK BY AND VERIFIED WITH: C.DENNY,RN AT 2357 ON 06/16/14 BY SHEA.W   Culture, respiratory (NON-Expectorated)     Status: None   Collection Time: 06/19/14 10:16 AM  Result Value Ref Range Status   Specimen Description ENDOTRACHEAL  Final   Special Requests NONE  Final   Gram Stain   Final    NO WBC SEEN NO SQUAMOUS EPITHELIAL CELLS SEEN NO ORGANISMS SEEN Performed at [facility]    Culture   Final    FEW CANDIDA ALBICANS Performed at [facility]    Report Status 06/21/2014 FINAL  Final  Culture, blood (routine x 2)     Status: None (Preliminary result)   Collection Time: 06/20/14  4:55 PM  Result Value Ref Range Status   Specimen Description BLOOD RIGHT ANTECUBITAL  Final   Special Requests BOTTLES DRAWN AEROBIC AND ANAEROBIC 10CC EACH  Final   Culture   Final           BLOOD CULTURE RECEIVED NO GROWTH TO DATE CULTURE WILL BE HELD FOR 5 DAYS BEFORE ISSUING A FINAL NEGATIVE REPORT Performed at [facility]    Report Status PENDING  Incomplete  Culture, blood (routine x 2)      Status: None (Preliminary  result)   Collection Time: 06/20/14  5:07 PM  Result Value Ref Range Status   Specimen Description BLOOD RIGHT ANTECUBITAL  Final   Special Requests BOTTLES DRAWN AEROBIC AND ANAEROBIC 10 CC EACH  Final   Culture   Final           BLOOD CULTURE RECEIVED NO GROWTH TO DATE CULTURE WILL BE HELD FOR 5 DAYS BEFORE ISSUING A FINAL NEGATIVE REPORT Performed at [facility]    Report Status PENDING  Incomplete    Assessment: She has defervesced and is improving. I will continue current antibiotics for 24 more hours and stop.  Plan: 1. Continue current antibiotics for 1 more day. I have entered stop orders. 2. I will sign off now but please call if I can be of further assistance while she is here  Cliffton Asters, MD [facility] [phone] pager   [phone] cell 06/23/2014, 2:37 PM

## 2014-06-23 NOTE — Evaluation (Signed)
Physical Therapy Evaluation Patient Details Name: [name] [name] MRN: [id number] DOB: June 21, 1940 Today's Date: 06/23/2014   History of Present Illness   [name] [name] is a 74 y.o. female adm with acute encephalopathyi has ben n a skilled nursing facility since last adm;  history of hypertension, bipolar disorder, hypothyroidism, who was the in this hospital back in November and was treated for pneumonia  Clinical Impression  Pt admitted with above diagnosis. Pt currently with functional limitations due to the deficits listed below (see PT Problem List). Pt will benefit from skilled PT to increase their independence and safety with mobility to allow discharge to the venue listed below.  Recommend pt return to SNF, will follow     Follow Up Recommendations SNF;Supervision/Assistance - 24 hour    Equipment Recommendations  None recommended by PT    Recommendations for Other Services       Precautions / Restrictions Precautions Precautions: Fall      Mobility  Bed Mobility Overal bed mobility: Needs Assistance Bed Mobility: Supine to Sit;Sit to Supine     Supine to sit: Mod assist Sit to supine: +2 for physical assistance;Max assist   General bed mobility comments: +2 for trunk and LEs, pt has much difficulty assisting self even with multi-modal  cues and hand over hand  Transfers Overall transfer level: Needs assistance Equipment used: 2 person hand held assist Transfers: Sit to/from Stand Sit to Stand: Max assist;+2 physical assistance;Mod assist         General transfer comment: 2 trials, pt requiring more assist on second trial; cues for technique, LE position and wt shift  Ambulation/Gait             General Gait Details: unable  Stairs            Wheelchair Mobility    Modified Rankin (Stroke Patients Only)       Balance Overall balance assessment: Needs assistance Sitting-balance support: No upper extremity supported Sitting balance-Leahy  Scale: Poor Sitting balance - Comments: pt unable to support self with  UEs, LOB posteriorly, diminished balance reactions;  Postural control: Posterior lean Standing balance support: Bilateral upper extremity supported Standing balance-Leahy Scale: Zero                               Pertinent Vitals/Pain Pain Assessment: No/denies pain Pt asking for apple juice and RN aware (going for swallow study after PT)  VSS during session    Home Living Family/patient expects to be discharged to:: Skilled nursing facility                      Prior Function Level of Independence: Needs assistance   Gait / Transfers Assistance Needed: pt was household ambulator and living with dtr prior to hospital adm just before this one (recent decline prior to that per last PT notes)           Hand Dominance        Extremity/Trunk Assessment   Upper Extremity Assessment: Defer to OT evaluation;Generalized weakness           Lower Extremity Assessment: Generalized weakness         Communication      Cognition Arousal/Alertness: Awake/alert Behavior During Therapy: WFL for tasks assessed/performed Overall Cognitive Status: Within Functional Limits for tasks assessed  General Comments      Exercises        Assessment/Plan    PT Assessment Patient needs continued PT services  PT Diagnosis Generalized weakness   PT Problem List Decreased strength;Decreased activity tolerance;Decreased balance;Decreased mobility  PT Treatment Interventions DME instruction;Gait training;Functional mobility training;Therapeutic activities;Therapeutic exercise;Patient/family education;Balance training   PT Goals (Current goals can be found in the Care Plan section) Acute Rehab PT Goals Patient Stated Goal: none stated Time For Goal Achievement: 07/07/14 Potential to Achieve Goals: Good    Frequency Min 3X/week   Barriers to discharge         Co-evaluation               End of Session Equipment Utilized During Treatment: Gait belt Activity Tolerance: Patient limited by fatigue Patient left: in bed (back to bed per RN request--> going for swallow study) Nurse Communication: Mobility status         Time: [id number] PT Time Calculation (min) (ACUTE ONLY): 24 min   Charges:   PT Evaluation $Initial PT Evaluation Tier I: 1 Procedure PT Treatments $Therapeutic Activity: 23-37 mins   PT G Codes:        [name] [name] 06/23/2014, 1:35 PM

## 2014-06-24 ENCOUNTER — Inpatient Hospital Stay (HOSPITAL_COMMUNITY): Payer: Medicare Other

## 2014-06-24 LAB — CBC
HCT: 31.5 % — ABNORMAL LOW (ref 36.0–46.0)
HEMOGLOBIN: 9.5 g/dL — AB (ref 12.0–15.0)
MCH: 30.9 pg (ref 26.0–34.0)
MCHC: 30.2 g/dL (ref 30.0–36.0)
MCV: 102.6 fL — AB (ref 78.0–100.0)
PLATELETS: 150 10*3/uL (ref 150–400)
RBC: 3.07 MIL/uL — ABNORMAL LOW (ref 3.87–5.11)
RDW: 13.7 % (ref 11.5–15.5)
WBC: 7.2 10*3/uL (ref 4.0–10.5)

## 2014-06-24 LAB — GLUCOSE, CAPILLARY
GLUCOSE-CAPILLARY: 115 mg/dL — AB (ref 70–99)
GLUCOSE-CAPILLARY: 166 mg/dL — AB (ref 70–99)
Glucose-Capillary: 105 mg/dL — ABNORMAL HIGH (ref 70–99)
Glucose-Capillary: 138 mg/dL — ABNORMAL HIGH (ref 70–99)
Glucose-Capillary: 199 mg/dL — ABNORMAL HIGH (ref 70–99)
Glucose-Capillary: 96 mg/dL (ref 70–99)

## 2014-06-24 LAB — BASIC METABOLIC PANEL
ANION GAP: 3 — AB (ref 5–15)
BUN: 26 mg/dL — ABNORMAL HIGH (ref 6–23)
CALCIUM: 8.1 mg/dL — AB (ref 8.4–10.5)
CHLORIDE: 123 meq/L — AB (ref 96–112)
CO2: 26 mmol/L (ref 19–32)
Creatinine, Ser: 1.03 mg/dL (ref 0.50–1.10)
GFR calc Af Amer: 61 mL/min — ABNORMAL LOW (ref >90)
GFR calc non Af Amer: 52 mL/min — ABNORMAL LOW (ref >90)
GLUCOSE: 93 mg/dL (ref 70–99)
Potassium: 3.3 mmol/L — ABNORMAL LOW (ref 3.5–5.1)
SODIUM: 152 mmol/L — AB (ref 135–145)

## 2014-06-24 LAB — VANCOMYCIN, TROUGH: Vancomycin Tr: 6.5 ug/mL — ABNORMAL LOW (ref 10.0–20.0)

## 2014-06-24 MED ORDER — POTASSIUM CHLORIDE CRYS ER 20 MEQ PO TBCR
40.0000 meq | EXTENDED_RELEASE_TABLET | Freq: Once | ORAL | Status: AC
  Administered 2014-06-24: 40 meq via ORAL
  Filled 2014-06-24: qty 2

## 2014-06-24 MED ORDER — DEXTROSE 5 % IV SOLN
INTRAVENOUS | Status: DC
  Administered 2014-06-25: 02:00:00 via INTRAVENOUS

## 2014-06-24 MED ORDER — ALBUTEROL SULFATE (2.5 MG/3ML) 0.083% IN NEBU
2.5000 mg | INHALATION_SOLUTION | Freq: Three times a day (TID) | RESPIRATORY_TRACT | Status: DC
  Administered 2014-06-24 – 2014-06-25 (×4): 2.5 mg via RESPIRATORY_TRACT
  Filled 2014-06-24 (×4): qty 3

## 2014-06-24 NOTE — Progress Notes (Signed)
Patient ID: [name] [name], female   DOB: [birth year], 74 y.o.   MRN: [id number] [facility] PROGRESS NOTE  [name] [name] YQM:[id number] DOB: [birth year] DOA: 06/16/2014 PCP: Astrid Divine, MD  Brief narrative:    74 y.o. female with history of hypertension, bipolar disorder, hypothyroidism, from SNF who presented to [facility] ED 06/16/2014 with altered mental status. On admission, she was found to be febrile with T max 102 F. On admission she was started on broad spectrum antibiotics. Her hospital course has been complicated with bradycardia and subsequent cardiac arrest, intubation and she was managed by CCM. She was extubated 06/22/2014. [facility] assumed care 06/24/2014.  Assessment/Plan:    Principal Problem: Bradycardia and cardiac arrest / ventilatory dependant respiratory failure / aspiration pneumonia / acute respiratory failure with hypoxia / demand ischemia - has suffered bradycardia and cardiac arrest but successfully revived - s/p intubation on 06/18/2014, extubated 06/22/2014. - pt on broad spectrum abx, vanco, cefepime, flagyl which were started on admission but will be stopped today per ID recommendations.  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 Her baseline creatinine 1.3 from 4 years ago  Anemia of chronic disease - secondary to CKD - hemoglobin is stable   Hypokalemia - due to sepsis, acute infection - supplemented   Dysphagia - Appreciate SLP evaluation   Thrombocytopenia - unclear etiology, possibly sepsis - she was on Lovenox for DVT prophylaxis - platelet count now WNL  Sepsis secondary to recurrent aspiration PNA - Day 9 vancomycin, cefepime  - Day 6 flagyl - ID following; blood cultures negative, CSF and sputum cultures negative    Hypothyroidism - Continue levothyroxine  Acute encephalopathy - secondary to sepsis, renal failure, hypernatremia  - mental status seems to be improving   Hx of Bipolar 1 disorder. - Hold outpt cogentin, tegretol, restoril, tramadol; resumed lithium  - Psych evaluation called 1/20 to assist with bipolar regimen   DVT Prophylaxis   Lovenox subQ ordered.  Code Status: Full.  Family Communication:  plan of care discussed with the patient Disposition Plan: transfer to telemetry; SNF on discharge.   IV access:  Peripheral IV Central line  Procedures and diagnostic studies in past 72 hours:   Dg Chest Port 1 View 06/24/2014  Worsening LEFT lower lobe aeration.     Dg Chest Port 1 View 06/21/2014   1. Increased bibasilar opacities, atelectasis based on CT 06/17/2014. 2. Similar positioning of tubes and central line, including endotracheal tube tip 18 mm above the carina.   Electronically Signed   By: Tiburcio Pea M.D.   On: 06/21/2014 06:27   Dg Swallowing Func-speech Pathology 06/23/2014   Other studies since admission 1/13 CT Head >> Negative  1/13 LP >> RBC 39, WBC 1 1/14 CT chest >> atherosclerosis 1/14 CT abd/pelvis >> CBD 10 mm, diverticulosis 1/15 MRI brain >> mild atrophy and small vessel disease 1/15 Echo >> mild LVH, EF 65 to 70%, grade 1 diastolic dysfx  Medical Consultants:  [facility] assumed care 06/24/2014 ID  Other Consultants:  Physical therapy Nutrition SLP  IAnti-Infectives:   Day 9 vancomycin, cefepime  Day 6 flagyl   Manson Passey, MD  [facility] Pager [phone]  If 7PM-7AM, please contact night-coverage [email or web address] Password [email or web address] 06/24/2014, 9:29 AM   LOS: 8 days  HPI/Subjective: No acute overnight events.  Objective: Filed Vitals:   06/24/14 0600 06/24/14 0700 06/24/14 0800 06/24/14 0900  BP: 123/64 120/64 120/63 132/65  Pulse: 73 61 57 76  Temp:      TempSrc:      Resp: Height:      Weight:       SpO2: 94% 92% 94% 100%    Intake/Output Summary (Last 24 hours) at 06/24/14 0929 Last data filed at 06/24/14 0600  Gross per 24 hour  Intake   3965 ml  Output   1875 ml  Net   2090 ml    Exam:   General:  Pt is alert, not in acute distress  Cardiovascular: Regular rate and rhythm, S1/S2 (+)  Respiratory: no wheezing, no crackles, no rhonchi  Abdomen: Soft, non tender, non distended, bowel sounds present  Extremities: No edema, pulses DP and PT palpable bilaterally  Neuro: Grossly nonfocal  Data Reviewed: Basic Metabolic Panel:  Recent Labs Lab 06/18/14 0508  06/19/14 0400  06/20/14 0500 06/20/14 1645 06/21/14 0550 06/22/14 0420 06/23/14 0500 06/24/14 0425  NA 168*  < > 150*  < >  --  157* 159* 152* 152* 152*  K 3.8  < > 3.2*  < >  --  4.1 3.7 4.0 3.5 3.3*  CL >130*  < > 127*  < >  --  >130* 128* 121* 119* 123*  CO2 25  < > 23  < >  --  GLUCOSE 162*  < > 206*  < >  --  171* 149* 268* 121* 93  BUN 30*  < > 33*  < >  --  33* 37* 40* 36* 26*  CREATININE 1.98*  < > 1.94*  < >  --  1.54* 1.37* 1.31* 1.14* 1.03  CALCIUM 10.1  < > 8.3*  < >  --  8.7 8.9 9.0 8.9 8.1*  MG 2.6*  --  2.2  --  2.0  --   --   --   --   --   PHOS 3.3  --   --   --  2.2*  --   --   --   --   --   < > = values in this interval not displayed. Liver Function Tests:  Recent Labs Lab 06/18/14 0508 06/18/14 2211 06/20/14 0500  AST 35 70* 27  ALT 37* 56* 31  ALKPHOS 88 83 52  BILITOT 0.5 0.7 0.7  PROT 6.6 6.2 4.9*  ALBUMIN 3.5 3.1* 2.5*   No results for input(s): LIPASE, AMYLASE in the last 168 hours. No results for input(s): AMMONIA in the last 168 hours. CBC:  Recent Labs Lab 06/20/14 0500 06/21/14 0550 06/22/14 0420 06/23/14 0500 06/24/14 0425  WBC 9.7 9.5 9.0 8.0 7.2  NEUTROABS  --   --  8.4* 6.2  --   HGB 10.0* 10.0* 9.8* 9.6* 9.5*  HCT 33.4* 33.4* 33.6* 32.3* 31.5*  MCV 103.1* 103.7* 104.7* 103.5* 102.6*  PLT 114* 100* 110* 139* 150   Cardiac  Enzymes:  Recent Labs Lab 06/18/14 1357 06/18/14 2211  CKTOTAL 91  --   CKMB 2.4  --   TROPONINI  --  0.09*   BNP: Invalid input(s): POCBNP CBG:  Recent Labs Lab 06/23/14 0431 06/23/14 0858 06/23/14 1642 06/23/14 2009 06/23/14 2352  GLUCAP 107* 121* 127* 149* 138*    Urine culture     Status: None  Collection Time: 06/16/14 11:38 AM  Result Value Ref Range Status   Specimen Description URINE, CATHETERIZED  Final   Special Requests NONE  Final   Culture NO GROWTH  Final   Report Status 06/17/2014 FINAL  Final  Culture, blood (routine x 2)     Status: None   Collection Time: 06/16/14 11:53 AM  Result Value Ref Range Status   Specimen Description BLOOD RIGHT HAND  Final   Culture   Final    NO GROWTH 5 DAYS   Report Status 06/22/2014 FINAL  Final  Culture, blood (routine x 2)     Status: None   Collection Time: 06/16/14 11:53 AM  Result Value Ref Range Status   Specimen Description BLOOD LEFT HAND  Final   Culture   Final    NO GROWTH 5 DAYS   Report Status 06/22/2014 FINAL  Final  Gram stain     Status: None   Collection Time: 06/16/14  5:07 PM  Result Value Ref Range Status   Specimen Description CSF  Final   Special Requests NONE  Final   Gram Stain   Final    NO ORGANISMS SEEN NO WBC SEEN   Report Status 06/16/2014 FINAL  Final  CSF culture     Status: None   Collection Time: 06/16/14  5:07 PM  Result Value Ref Range Status   Specimen Description CSF  Final    NO GROWTH 3 DAYS   Report Status 06/20/2014 FINAL  Final  MRSA PCR Screening     Status: Abnormal   Collection Time: 06/16/14  5:19 PM  Result Value Ref Range Status   MRSA by PCR POSITIVE (A) NEGATIVE Final  Culture, respiratory (NON-Expectorated)     Status: None   Collection Time: 06/19/14 10:16 AM  Result Value Ref Range Status   Specimen Description ENDOTRACHEAL  Final   Special Requests NONE  Final   Gram Stain   Final   Culture   Final    FEW CANDIDA ALBICANS   Report Status  06/21/2014 FINAL  Final  Culture, blood (routine x 2)     Status: None (Preliminary result)   Collection Time: 06/20/14  4:55 PM  Result Value Ref Range Status   Specimen Description BLOOD RIGHT ANTECUBITAL  Final   Culture   Final           BLOOD CULTURE RECEIVED NO GROWTH TO DATE    Report Status PENDING  Incomplete  Culture, blood (routine x 2)     Status: None (Preliminary result)   Collection Time: 06/20/14  5:07 PM  Result Value Ref Range Status   Specimen Description BLOOD RIGHT ANTECUBITAL  Final   Culture   Final           BLOOD CULTURE RECEIVED NO GROWTH TO DATE    Report Status PENDING  Incomplete     Scheduled Meds: . albuterol  2.5 mg Nebulization TID  . aspirin  81 mg Per Tube Daily  . ceFEPime (MAXIPIME)   1 g Intravenous Q24H  . chlorhexidine  15 mL Mouth Rinse BID  . enoxaparin injection  40 mg Subcutaneous Q24H  . RESOURCE BREEZE  1 Container Oral TID BM  . insulin aspart  0-15 Units Subcutaneous 6 times per day  . levothyroxine  150 mcg Per Tube QAC breakfast  . lithium carbonate  300 mg Oral Q12H  . metronidazole  500 mg Intravenous 3 times per day  . pantoprazole sodium  40 mg  Per Tube Q24H  . simvastatin  20 mg Per Tube QHS  . thiamine  100 mg Per Tube Daily  . vancomycin  750 mg Intravenous Q24H   Continuous Infusions: . dextrose 75 mL/hr at 06/24/14 0700

## 2014-06-24 NOTE — Progress Notes (Signed)
ANTIBIOTIC CONSULT NOTE - follow up  Pharmacy Consult for cefepime, vancomycin Indication: HCAP  Allergies  Allergen Reactions  . Antihistamine Decongestant [Triprolidine-Pse] Diarrhea  . Codeine Other (See Comments)    Gi upset     Patient Measurements: Height: 5\' 8"  (172.7 cm) Weight: 167 lb 12.3 oz (76.1 kg) IBW/kg (Calculated) : 63.9  Vital Signs: Temp: 98.3 F (36.8 C) (01/21 0800) Temp Source: Oral (01/21 0800) BP: 138/67 mmHg (01/21 1000) Pulse Rate: 60 (01/21 1000) Intake/Output from previous day: 01/20 0701 - 01/21 0700 In: 4635 [P.O.:2190; I.V.:1945; IV Piggyback:500] Out: 2625 [Urine:2625]  Labs:  Recent Labs  06/22/14 0420 06/23/14 0500 06/24/14 0425  WBC 9.0 8.0 7.2  HGB 9.8* 9.6* 9.5*  PLT 110* 139* 150  CREATININE 1.31* 1.14* 1.03   Estimated Creatinine Clearance: 48.3 mL/min (by C-G formula based on Cr of 1.03). No results for input(s): VANCOTROUGH, VANCOPEAK, VANCORANDOM, GENTTROUGH, GENTPEAK, GENTRANDOM, TOBRATROUGH, TOBRAPEAK, TOBRARND, AMIKACINPEAK, AMIKACINTROU, AMIKACIN in the last 72 hours.    Microbiology: 1/13 blood x 2: NGF 1/13 CSF: NGF  1/13 urine: NGF 1/13 flu panel: neg 1/13 MRSA PCR screen: positive 1/16 Sputum: Few Candida 1/17 blood x2: ngtd  Anti-infectives: 1/13 >> cefepime >> 1/13 >> vancomycin >> 1/16 >> metronidazole >>  Serum levels and dosage adjustments: 1/15: Vancomycin reduced from 1g q24h to 750mg  q24h and cefepime from 1g q12h to 1 gram q24h based upon renal function 1/17: VT 17.7 on 750mg  q24h - continue present dosage. 1/21 VT 6.5 on 750mg  IV q24, below goal level.  Assessment: 74 y/o F admitted through ED from rehab facility 1/13 with fever and AMS.  CXR showed unchanged mild subsegmental atelectasis or scar RLL.  Orders received to begin cefepime and vancomycin with pharmacy dosing assistance for r/o HCAP.   Azotemia noted.  Per [facility] records, baseline SCr ~ 1.2.  Today, 06/24/2014:  D#9 Cefepime and  Vancomycin  D#6 metronidazole  Tmax: 98.3  WBCs: remains WNL  Renal:  SCr improving 1.03, CrCl 48 ml/min  Goal of Therapy:  Appropriate antibiotic dosing for indication and renal function; eradication of infection. Vancomycin trough 15-20  Plan: 1. Continue vancomycin 750mg  IV q24h  No dose change despite low trough level.  Last scheduled dose is in process.  Per ID, pt is improved and plans to d/c orders after today's dose. 2. Continue cefepime 1gm q24h 3. Continue metronidazole as ordered by ID (500mg  IV q8h) 4. Follow serum creatinine, cultures, clinical course.  Lynann Beaverhristine Diann Bangerter PharmD, BCPS Pager [phone] 06/24/2014 11:26 AM

## 2014-06-24 NOTE — Progress Notes (Signed)
Clinical Social Work Progress Note PSYCHIATRY SERVICE LINE 06/24/2014  Patient:  [name] [name]  Account:  [id number]  Admit Date:  06/16/2014  Clinical Social Worker:  Sindy Messing, LCSW  Date/Time:  06/24/2014 10:45 AM  Review of Patient  Overall Medical Condition:   Patient reports she is still feeling weak but hopeful to DC soon.   Participation Level:  Minimal  Participation Quality  Drowsy   Other Participation Quality:   Patient sleepy and reports she did not sleep well last night.   Affect  Flat   Cognitive  Appropriate   Reaction to Medications/Concerns:   Patient reports that Risperdal helps her sleep and was interested for that medication to be restarted.   Modes of Intervention  Support   Summary of Progress/Plan at Discharge   CSW met with patient at bedside in order to follow up. Patient laying in bed and reports she has been napping off and on this morning.    Patient reports she is still feeling weak and knows she is not ready to DC. Patient reports no concerns with bipolar disorder at this time and is aware that psych MD was going to restart medications. Patient reports she is not sleeping well and wants psych MD to look at her sleeping medications as well.    Patient reports no concerns today but just wants to rest. Patient thanked CSW for visit and reports CSW can continue to follow throughout hospital stay. Patient plans to return to [facility] ([facility] at DC because family works and is unable to provide 24/7 care.     [place], [place] [phone]

## 2014-06-24 NOTE — Progress Notes (Addendum)
Speech Language Pathology Treatment: Dysphagia  Patient Details Name: [name] [name] MRN: [id number] DOB: Oct 22, 1940 Today's Date: 06/24/2014 Time: [id number] SLP Time Calculation (min) (ACUTE ONLY): 10 min  Assessment / Plan / Recommendation Clinical Impression  Pt with much improved tolerance of po intake and ability to self feed today!  She continues to report that her swallow ability is not at baseline function however.  Pt observed with nectar thickened juice/water mix, applesauce and moistened graham cracker.  No indications of airway compromise/aspiration with po intake nor anterior labial spillage as observed a few days prior.  Suspect pt's exacerbation of dysphagia is resolving as medical status improves.    Pt continues to report desire for nectar thick liquids as this decreases cough with intake.  Reviewed MBS with pt and clinical reasoning for compensation strategies using teach back and providing written tips.  Recommend dys3/nectar - due to pt's edentulous status and level of dysphagia.    SLP to follow up briefly for assurance of tolerance of diet and recommend short term SNF SLP intervention for dysphagia management upon dc.   Worsening LEFT lower lobe aeration per CXR today.   HPI HPI: 74 [facility] and [facility]. Pt found to have recurrent pna- CXR showed increased bilateral opacities - ATX.  During hospital stay, pt became lethargic and had agonal respirations - code blue called and pt received CPR and was intubated on 1/15-1/19.  Swallow evaluation ordered s/p extubation.  Pt had a clinical swallow evaluation during previous admission and was placed on dys3/nectar diet.     Pertinent Vitals Pain Assessment: No/denies pain  SLP Plan  Continue with current plan of care    Recommendations Diet recommendations: Dysphagia 3 (mechanical soft);Nectar-thick liquid Liquids provided via: Cup;No straw Medication Administration: Whole meds with puree (start and follow with  liquids) Supervision: Intermittent supervision to cue for compensatory strategies Compensations: Slow rate;Small sips/bites (intermittent dry swallow, consume liquids throughout meal) Postural Changes and/or Swallow Maneuvers: Seated upright 90 degrees;Upright 30-60 min after meal              Oral Care Recommendations: Oral care BID Follow up Recommendations: Skilled Nursing facility Plan: Continue with current plan of care    GO     [name] Burnetamara Dianey Suchy, MS [facility] SLP [phone]

## 2014-06-25 DIAGNOSIS — N182 Chronic kidney disease, stage 2 (mild): Secondary | ICD-10-CM

## 2014-06-25 LAB — BASIC METABOLIC PANEL WITH GFR
Anion gap: 7 (ref 5–15)
BUN: 20 mg/dL (ref 6–23)
CO2: 27 mmol/L (ref 19–32)
Calcium: 8.9 mg/dL (ref 8.4–10.5)
Chloride: 115 meq/L — ABNORMAL HIGH (ref 96–112)
Creatinine, Ser: 1.03 mg/dL (ref 0.50–1.10)
GFR calc Af Amer: 61 mL/min — ABNORMAL LOW
GFR calc non Af Amer: 52 mL/min — ABNORMAL LOW
Glucose, Bld: 129 mg/dL — ABNORMAL HIGH (ref 70–99)
Potassium: 3.7 mmol/L (ref 3.5–5.1)
Sodium: 149 mmol/L — ABNORMAL HIGH (ref 135–145)

## 2014-06-25 LAB — GLUCOSE, CAPILLARY
Glucose-Capillary: 109 mg/dL — ABNORMAL HIGH (ref 70–99)
Glucose-Capillary: 121 mg/dL — ABNORMAL HIGH (ref 70–99)
Glucose-Capillary: 137 mg/dL — ABNORMAL HIGH (ref 70–99)
Glucose-Capillary: 181 mg/dL — ABNORMAL HIGH (ref 70–99)

## 2014-06-25 LAB — CBC
HEMATOCRIT: 34.8 % — AB (ref 36.0–46.0)
Hemoglobin: 10.2 g/dL — ABNORMAL LOW (ref 12.0–15.0)
MCH: 30.4 pg (ref 26.0–34.0)
MCHC: 29.3 g/dL — AB (ref 30.0–36.0)
MCV: 103.9 fL — AB (ref 78.0–100.0)
PLATELETS: 183 10*3/uL (ref 150–400)
RBC: 3.35 MIL/uL — ABNORMAL LOW (ref 3.87–5.11)
RDW: 13.7 % (ref 11.5–15.5)
WBC: 6.5 10*3/uL (ref 4.0–10.5)

## 2014-06-25 MED ORDER — ACETAMINOPHEN 160 MG/5ML PO SOLN: 650.0000 mg | Freq: Four times a day (QID) | ORAL | Status: DC | PRN

## 2014-06-25 MED ORDER — PANTOPRAZOLE SODIUM 40 MG PO PACK: 40.0000 mg | PACK | ORAL | Status: DC

## 2014-06-25 MED ORDER — ASPIRIN 81 MG PO CHEW: 81.0000 mg | CHEWABLE_TABLET | Freq: Every day | ORAL | Status: DC

## 2014-06-25 MED ORDER — BENZTROPINE MESYLATE 1 MG PO TABS: 1.0000 mg | ORAL_TABLET | Freq: Every day | ORAL | Status: DC

## 2014-06-25 MED ORDER — BOOST / RESOURCE BREEZE PO LIQD: 1.0000 | Freq: Three times a day (TID) | ORAL | Status: DC

## 2014-06-25 MED ORDER — RESOURCE THICKENUP CLEAR PO POWD: 1.0000 | ORAL | Status: DC | PRN

## 2014-06-25 MED ORDER — CHLORHEXIDINE GLUCONATE 0.12 % MT SOLN: 15.0000 mL | Freq: Two times a day (BID) | OROMUCOSAL | Status: DC

## 2014-06-25 MED ORDER — LORAZEPAM 0.5 MG PO TABS: 0.5000 mg | ORAL_TABLET | Freq: Four times a day (QID) | ORAL | Status: DC | PRN

## 2014-06-25 MED ORDER — ALBUTEROL SULFATE (2.5 MG/3ML) 0.083% IN NEBU: 2.5000 mg | INHALATION_SOLUTION | RESPIRATORY_TRACT | Status: AC | PRN

## 2014-06-25 MED ORDER — TRAMADOL HCL 50 MG PO TABS: 25.0000 mg | ORAL_TABLET | Freq: Four times a day (QID) | ORAL | Status: DC | PRN

## 2014-06-25 NOTE — Discharge Summary (Addendum)
Physician Discharge Summary  Yolonda Purtle ZOX:[id number] DOB: 1940-11-15 DOA: 06/16/2014  PCP: Astrid Divine, MD  Admit date: 06/16/2014 Discharge date: 06/25/2014  Recommendations for Outpatient Follow-up:  1. Please continue to hold lithium and tegretol until sodium level is WNL. Sodium is 149 prior to discharge. 2. Please have psychiatry evaluate the patient in 1-2 weeks after discharge.  Discharge Diagnoses:  Principal Problem:   Fever Active Problems:   Bipolar 1 disorder   Essential hypertension   Hypothyroid   Acute kidney injury   Acute encephalopathy   Hypernatremia   Dehydration   Sepsis   Elevated troponin   Transaminitis   Macrocytosis without anemia   Atherosclerotic peripheral vascular disease   Altered mental state   Aspiration pneumonitis   FUO (fever of unknown origin)   Encephalopathy    Discharge Condition: stable   Diet recommendation: as tolerated   History of present illness:  74 y.o. female with history of hypertension, bipolar disorder, hypothyroidism, from SNF who presented to [facility] ED 06/16/2014 with altered mental status. On admission, she was found to be febrile with T max 102 F. On admission she was started on broad spectrum antibiotics. Her hospital course has been complicated with bradycardia and subsequent cardiac arrest, intubation and she was managed by CCM. She was extubated 06/22/2014. [facility] assumed care 06/24/2014.  Assessment/Plan:    Principal Problem: Bradycardia and cardiac arrest / ventilatory dependant respiratory failure / aspiration pneumonia / acute respiratory failure with hypoxia / demand ischemia - has suffered bradycardia and cardiac arrest but successfully revived - Patient was under critical care medicine team status post intubation on 06/18/2014. She was successfully extubated on 06/22/2014. - Please note that patient was on broad-spectrum antibiotics since the admission, vancomycin, cefepime. Then Flagyl was  started. Today on 06/25/2014 she has received 10 days of vancomycin and cefepime and 7 days of Flagyl. These antibiotics were stopped prior to discharge per infectious disease recommendations. 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 This is reasonable prior to discharge. Patient should be off of lithium and Tegretol until sodium level is back to normal. - renal function now WNL.   Anemia of chronic disease - Secondary to history of chronic kidney disease -  hemoglobin is stable, no current indications for transfusion   Hypokalemia - due to sepsis, acute infection - supplemented  and within normal limits  Dysphagia - Appreciate SLP evaluation  - Patient able to tolerate by mouth intake with resource thickener  Thrombocytopenia - unclear etiology, possibly sepsis - she was on Lovenox for DVT prophylaxis - platelet count spontaneously resolved to normal.   Sepsis secondary to recurrent aspiration PNA - Day 10 vancomycin, cefepime  - Day 7 flagyl - appreciate ID following; blood cultures negative, CSF and sputum cultures negative   Hypothyroidism - Continue levothyroxine on discharge   Acute encephalopathy - secondary to sepsis, renal failure, hypernatremia  - mental status better this am  Hx of Bipolar 1 disorder. - Hold outpt tegretol and lithium but may resume cogentin  - Psych evaluation done 06/23/2014   DVT Prophylaxis   Lovenox subQ ordered while pt is in hospital   Code Status: Full.  Family Communication: family not at the bedside this am   IV access:  Peripheral IV Central line  Procedures and diagnostic studies in past 72 hours:   Dg Chest Port 1 View 06/24/2014 Worsening LEFT lower lobe aeration.   Dg Chest Port 1  View 06/21/2014 1. Increased bibasilar opacities, atelectasis based on CT 06/17/2014. 2. Similar positioning of tubes and central line, including endotracheal tube tip 18 mm above the carina. Electronically Signed By: Tiburcio Pea M.D. On: 06/21/2014 06:27   Dg Swallowing Func-speech Pathology 06/23/2014   Other studies since admission 1/13 CT Head >> Negative  1/13 LP >> RBC 39, WBC 1 1/14 CT chest >> atherosclerosis 1/14 CT abd/pelvis >> CBD 10 mm, diverticulosis 1/15 MRI brain >> mild atrophy and small vessel disease 1/15 Echo >> mild LVH, EF 65 to 70%, grade 1 diastolic dysfx  Medical Consultants:  [facility] assumed care 06/24/2014 ID  Other Consultants:  Physical therapy Nutrition SLP  IAnti-Infectives:   Day 10 vancomycin, cefepime  Day 7 flagyl   Signed:  Manson Passey, MD  [facility] 06/25/2014, 11:48 AM  Pager #: [phone]    Discharge Exam: Filed Vitals:   06/25/14 0516  BP: 116/71  Pulse: 72  Temp: 98 F (36.7 C)  Resp: 20   Filed Vitals:   06/24/14 2022 06/24/14 2114 06/25/14 0516 06/25/14 0856  BP: 125/79  116/71   Pulse: 79  72   Temp: 97.6 F (36.4 C)  98 F (36.7 C)   TempSrc: Oral  Oral   Resp: 20  20   Height:      Weight:      SpO2: 97% 100% 95% 96%    General: Pt is not in acute distress Cardiovascular: Regular rhythm, S1, S2 appreciated Respiratory: Bilateral air entry, no wheezing Abdominal: Nondistended, positive bowel sounds Extremities: no edema, no cyanosis, pulses palpable bilaterally DP and PT Neuro: No focal neurological deficits  Discharge Instructions  Discharge Instructions    Call MD for:  difficulty breathing, headache or visual disturbances    Complete by:  As directed      Call MD for:  persistant dizziness or light-headedness    Complete by:  As directed      Call MD for:  persistant nausea and vomiting    Complete by:  As directed      Call MD for:  severe uncontrolled pain     Complete by:  As directed      Diet - low sodium heart healthy    Complete by:  As directed      Discharge instructions    Complete by:  As directed   1. Please continue to hold lithium and tegretol until sodium level is WNL. Sodium is 149 prior to discharge.     Increase activity slowly    Complete by:  As directed             Medication List    STOP taking these medications        amoxicillin-clavulanate 875-125 MG per tablet  Commonly known as:  AUGMENTIN     carbamazepine 200 MG tablet  Commonly known as:  TEGRETOL     free water Soln     lithium carbonate 300 MG capsule      TAKE these medications        acetaminophen 160 MG/5ML solution  Commonly known as:  TYLENOL  Take 20.3 mLs (650 mg total) by mouth every 6 (six) hours as needed for mild pain, headache or fever.     albuterol (2.5 MG/3ML) 0.083% nebulizer solution  Commonly known as:  PROVENTIL  Take 3 mLs (2.5 mg total) by nebulization every 4 (four) hours as needed for wheezing.     amLODipine 5 MG tablet  Commonly known as:  NORVASC  Take 5 mg by mouth daily with breakfast.     antiseptic oral rinse Liqd  15 mLs by Mouth Rinse route as needed for dry mouth.     aspirin 81 MG chewable tablet  Chew 1 tablet (81 mg total) by mouth daily.     b complex vitamins capsule  Take 1 capsule by mouth daily with breakfast.     benztropine 1 MG tablet  Commonly known as:  COGENTIN  Take 1 tablet (1 mg total) by mouth daily with breakfast.     chlorhexidine 0.12 % solution  Commonly known as:  PERIDEX  15 mLs by Mouth Rinse route 2 (two) times daily.     cholecalciferol 1000 UNITS tablet  Commonly known as:  VITAMIN D  Take 1,000 Units by mouth daily with breakfast.     feeding supplement (RESOURCE BREEZE) Liqd  Take 1 Container by mouth 3 (three) times daily between meals.     guaiFENesin-dextromethorphan 100-10 MG/5ML syrup  Commonly known as:  ROBITUSSIN DM  Take 5 mLs by mouth every 4 (four)  hours as needed for cough.     lactobacillus acidophilus Tabs tablet  Take 1 tablet by mouth 2 (two) times daily.     levothyroxine 150 MCG tablet  Commonly known as:  SYNTHROID, LEVOTHROID  Take 150 mcg by mouth daily before breakfast.     LORazepam 0.5 MG tablet  Commonly known as:  ATIVAN  Take 1 tablet (0.5 mg total) by mouth every 6 (six) hours as needed for anxiety (and agitation).     pantoprazole sodium 40 mg/20 mL Pack  Commonly known as:  PROTONIX  Take 20 mLs (40 mg total) by mouth daily.     phenol 1.4 % Liqd  Commonly known as:  CHLORASEPTIC  Use as directed 2 sprays in the mouth or throat every 2 (two) hours as needed for throat irritation / pain.     RESOURCE THICKENUP CLEAR Powd  Use as indicated.     RESOURCE THICKENUP CLEAR Powd  Take 1 Container by mouth as needed.     senna 8.6 MG Tabs tablet  Commonly known as:  SENOKOT  Take 1 tablet (8.6 mg total) by mouth 2 (two) times daily.     simvastatin 20 MG tablet  Commonly known as:  ZOCOR  Take 20 mg by mouth at bedtime.     temazepam 7.5 MG capsule  Commonly known as:  RESTORIL  Take 7.5 mg by mouth at bedtime.     traMADol 50 MG tablet  Commonly known as:  ULTRAM  Take 0.5 tablets (25 mg total) by mouth every 6 (six) hours as needed (for pain).           Follow-up Information    Follow up with Astrid Divine, MD. Schedule an appointment as soon as possible for a visit in 2 weeks.   Specialty:  Family Medicine   Why:  Follow up appt after recent hospitalization   Contact information:   [address] [place] [address] [phone]        The results of significant diagnostics from this hospitalization (including imaging, microbiology, ancillary and laboratory) are listed below for reference.    Significant Diagnostic Studies: Ct Abdomen Pelvis Wo Contrast  06/17/2014   CLINICAL DATA:  74 year old female with fever, sepsis and decreased level of consciousness.   EXAM: CT CHEST, ABDOMEN AND PELVIS WITHOUT CONTRAST  TECHNIQUE: Multidetector CT imaging of the chest, abdomen and pelvis was performed following the standard protocol without IV contrast.  COMPARISON:  No priors.  FINDINGS: CT CHEST FINDINGS  Mediastinum/Lymph Nodes: Heart size is normal. There is no significant pericardial fluid, thickening or pericardial calcification. Atherosclerosis of the thoracic aorta and great vessels of the mediastinum, including a ectasia of the isthmus of the thoracic aorta which measures up to 3.8 cm in diameter. No pathologically enlarged mediastinal, hilar or axillary lymph nodes. Esophagus is unremarkable in appearance.  Lungs/Pleura: In the basal portions of the lower lobes of the lungs bilaterally (left greater than right), there is some thickening of the peribronchovascular interstitium and focal architectural distortion with volume loss, favored to reflect chronic scarring. No consolidative airspace disease. No pleural effusions. No suspicious appearing pulmonary nodules or masses.  Musculoskeletal/Soft Tissues: There are no aggressive appearing lytic or blastic lesions noted in the visualized portions of the skeleton.  CT ABDOMEN AND PELVIS FINDINGS  Hepatobiliary: The gallbladder is not visualized and is likely surgically absent (no surgical clips are noted in the region of the gallbladder fossa). Common bile duct appears mildly dilated measuring 10 mm in the porta hepatis. There also appears to be mild intrahepatic biliary ductal dilatation, although assessment is limited on today's non contrast CT examination. No discrete hepatic lesions are noted on today's non contrast CT examination.  Pancreas: Unremarkable.  Spleen: Unremarkable.  Adrenals/Urinary Tract: There is some stranding around the right adrenal gland. No discrete adrenal mass or nodule identified. Left adrenal gland is unremarkable in appearance. Small amount of perinephric stranding adjacent to the kidneys  bilaterally (nonspecific). The unenhanced appearance of the kidneys is otherwise unremarkable. No hydroureteronephrosis. Foley balloon catheter with tip in the lumen of the urinary bladder. Small amount of nondependent gas in the lumen of the urinary bladder, iatrogenic.  Stomach/Bowel: The unenhanced appearance of the stomach is normal. No pathologic dilatation of small bowel or colon. Numerous colonic diverticulae are noted, particularly in the sigmoid colon, without surrounding inflammatory changes to suggest an acute diverticulitis at this time.  Vascular/Lymphatic: Mild atherosclerotic calcifications throughout the abdominal and pelvic vasculature, without definite aneurysm. No lymphadenopathy identified in the abdomen or pelvis on today's non contrast CT examination.  Reproductive: Uterus and ovaries are atrophic.  Other: No significant volume of ascites.  No pneumoperitoneum.  Musculoskeletal: There are no aggressive appearing lytic or blastic lesions noted in the visualized portions of the skeleton.  IMPRESSION: 1. No definite source to explain the patient's sepsis identified on today's examination. 2. There is mild intra and extrahepatic biliary ductal dilatation, which likely reflects post cholecystectomy physiology in this patient. Correlation with liver function tests is recommended, however. 3. Thickening of the peribronchovascular interstitium throughout the basal segments of the lower lobes of the lungs bilaterally (left greater than right), most likely to reflect areas of chronic post infectious or inflammatory scarring. 4. Atherosclerosis, including ectasia of the isthmus of the thoracic aorta which measures up to 3.8 cm in diameter. 5. Colonic diverticulosis without findings to suggest acute diverticulitis at this time. 6. Additional incidental findings, as above.   Electronically Signed   By: Trudie Reed M.D.   On: 06/17/2014 16:44   Ct Head Wo Contrast  06/16/2014   CLINICAL DATA:   Altered mental status. Unable to communicate. Unable to walk.  EXAM: CT HEAD WITHOUT CONTRAST  TECHNIQUE: Contiguous axial images were obtained from the base of the skull through the vertex without intravenous contrast.  COMPARISON:  10/07/2010  FINDINGS: No acute intracranial abnormality. Specifically, no hemorrhage, hydrocephalus, mass lesion, acute infarction, or significant intracranial injury. No acute calvarial abnormality. Visualized paranasal sinuses and mastoids clear. Orbital soft tissues unremarkable.  IMPRESSION: No acute intracranial abnormality.   Electronically Signed   By: Charlett Nose M.D.   On: 06/16/2014 14:17   Ct Chest Wo Contrast  06/17/2014   CLINICAL DATA:  74 year old female with fever, sepsis and decreased level of consciousness.  EXAM: CT CHEST, ABDOMEN AND PELVIS WITHOUT CONTRAST  TECHNIQUE: Multidetector CT imaging of the chest, abdomen and pelvis was performed following the standard protocol without IV contrast.  COMPARISON:  No priors.  FINDINGS: CT CHEST FINDINGS  Mediastinum/Lymph Nodes: Heart size is normal. There is no significant pericardial fluid, thickening or pericardial calcification. Atherosclerosis of the thoracic aorta and great vessels of the mediastinum, including a ectasia of the isthmus of the thoracic aorta which measures up to 3.8 cm in diameter. No pathologically enlarged mediastinal, hilar or axillary lymph nodes. Esophagus is unremarkable in appearance.  Lungs/Pleura: In the basal portions of the lower lobes of the lungs bilaterally (left greater than right), there is some thickening of the peribronchovascular interstitium and focal architectural distortion with volume loss, favored to reflect chronic scarring. No consolidative airspace disease. No pleural effusions. No suspicious appearing pulmonary nodules or masses.  Musculoskeletal/Soft Tissues: There are no aggressive appearing lytic or blastic lesions noted in the visualized portions of the skeleton.  CT  ABDOMEN AND PELVIS FINDINGS  Hepatobiliary: The gallbladder is not visualized and is likely surgically absent (no surgical clips are noted in the region of the gallbladder fossa). Common bile duct appears mildly dilated measuring 10 mm in the porta hepatis. There also appears to be mild intrahepatic biliary ductal dilatation, although assessment is limited on today's non contrast CT examination. No discrete hepatic lesions are noted on today's non contrast CT examination.  Pancreas: Unremarkable.  Spleen: Unremarkable.  Adrenals/Urinary Tract: There is some stranding around the right adrenal gland. No discrete adrenal mass or nodule identified. Left adrenal gland is unremarkable in appearance. Small amount of perinephric stranding adjacent to the kidneys bilaterally (nonspecific). The unenhanced appearance of the kidneys is otherwise unremarkable. No hydroureteronephrosis. Foley balloon catheter with tip in the lumen of the urinary bladder. Small amount of nondependent gas in the lumen of the urinary bladder, iatrogenic.  Stomach/Bowel: The unenhanced appearance of the stomach is normal. No pathologic dilatation of small bowel or colon. Numerous colonic diverticulae are noted, particularly in the sigmoid colon, without surrounding inflammatory changes to suggest an acute diverticulitis at this time.  Vascular/Lymphatic: Mild atherosclerotic calcifications throughout the abdominal and pelvic vasculature, without definite aneurysm. No lymphadenopathy identified in the abdomen or pelvis on today's non contrast CT examination.  Reproductive: Uterus and ovaries are atrophic.  Other: No significant volume of ascites.  No pneumoperitoneum.  Musculoskeletal: There are no aggressive appearing lytic or blastic lesions noted in the visualized portions of the skeleton.  IMPRESSION: 1. No definite source to explain the patient's sepsis identified on today's examination. 2. There is mild intra and extrahepatic biliary ductal  dilatation, which likely reflects post cholecystectomy physiology in this patient. Correlation with liver function tests is recommended, however. 3. Thickening of the peribronchovascular interstitium throughout the basal segments of the lower lobes of the lungs bilaterally (left greater than right), most likely to reflect areas of  chronic post infectious or inflammatory scarring. 4. Atherosclerosis, including ectasia of the isthmus of the thoracic aorta which measures up to 3.8 cm in diameter. 5. Colonic diverticulosis without findings to suggest acute diverticulitis at this time. 6. Additional incidental findings, as above.   Electronically Signed   By: Trudie Reed M.D.   On: 06/17/2014 16:44   Mr Brain Wo Contrast  06/18/2014   CLINICAL DATA:  Possible CVA. Encephalopathy. Unresponsive. Fever. Acute mental status decline over the past 3 days.  EXAM: MRI HEAD WITHOUT CONTRAST  TECHNIQUE: Multiplanar, multiecho pulse sequences of the brain and surrounding structures were obtained without intravenous contrast.  COMPARISON:  CT head 06/16/2014.  FINDINGS: No evidence for acute infarction, hemorrhage, mass lesion, hydrocephalus, or extra-axial fluid. Generalized atrophy. Mild subcortical and periventricular T2 and FLAIR hyperintensities, likely chronic microvascular ischemic change. Flow voids are maintained throughout the carotid, basilar, and vertebral arteries. There are no areas of chronic hemorrhage. Pituitary, pineal, and cerebellar tonsils unremarkable. No upper cervical lesions. Visualized calvarium, skull base, and upper cervical osseous structures unremarkable. Scalp and extracranial soft tissues, orbits, sinuses, and mastoids show no acute process.  IMPRESSION: Mild atrophy and small vessel disease. No acute intracranial abnormality.   Electronically Signed   By: Davonna Belling M.D.   On: 06/18/2014 12:14   Dg Chest Port 1 View  06/24/2014   CLINICAL DATA:  Respiratory failure.  EXAM: PORTABLE CHEST  - 1 VIEW  COMPARISON:  06/21/2014.  FINDINGS: The patient has been extubated. Central venous catheter remains within the SVC, with no pneumothorax. Cardiac enlargement is stable. Low lung volumes are noted. Increasing opacity in the retrocardiac region is present, and could represent atelectasis, infiltrate, and effusion. No osseous findings.  IMPRESSION: Worsening LEFT lower lobe aeration.   Electronically Signed   By: Davonna Belling M.D.   On: 06/24/2014 07:16   Dg Chest Port 1 View  06/21/2014   CLINICAL DATA:  Aspiration pneumonitis.  EXAM: PORTABLE CHEST - 1 VIEW  COMPARISON:  06/20/2014.  FINDINGS: Endotracheal tube is in similar position, tip 18 mm above the carina. A left IJ catheter and orogastric tube remain in good position.  Stable heart size and mediastinal contours given differences in technique. Lung volumes remain low and there is increased and retrocardiac opacity with air bronchograms. There is also coarsening of lung markings at the medial right base that is more prominent. No edema, effusion, or pneumothorax.  IMPRESSION: 1. Increased bibasilar opacities, atelectasis based on CT 06/17/2014. 2. Similar positioning of tubes and central line, including endotracheal tube tip 18 mm above the carina.   Electronically Signed   By: Tiburcio Pea M.D.   On: 06/21/2014 06:27   Dg Chest Port 1 View  06/20/2014   CLINICAL DATA:  Subsequent encounter for aspiration pneumonitis.  EXAM: PORTABLE CHEST - 1 VIEW  COMPARISON:  1 day prior  FINDINGS: Support apparatus: Endotracheal tube terminates 2.3 cm above carina. Left internal jugular line terminates at low SVC. Nasogastric tube extends beyond the inferior aspect of the film.  Cardiomediastinal silhouette: Patient rotated right. Cardiomegaly with tortuous thoracic aorta.  Pleura:  No pleural effusion or pneumothorax.  Lungs: No congestive failure. Improved left side aeration with mild left base atelectasis remaining. Moderate right hemidiaphragm  elevation.  Other: None  IMPRESSION: Improved left side aeration with mild atelectasis remaining.  Cardiomegaly without congestive failure.   Electronically Signed   By: Jeronimo Greaves M.D.   On: 06/20/2014 07:18   Dg Chest Port 1  View  06/19/2014   CLINICAL DATA:  Central line placement.  Initial encounter.  EXAM: PORTABLE CHEST - 1 VIEW  COMPARISON:  Chest radiograph performed 06/18/2014  FINDINGS: The patient's left IJ line is seen ending about the mid to distal SVC. The endotracheal tube is seen ending 2-3 cm above the carina. The enteric tube is noted extending below the diaphragm.  Mild left midlung and left basilar airspace opacity raises concern for mild pneumonia. Minimal right basilar opacity is seen. No pleural effusion or pneumothorax is seen; the left costophrenic angle is incompletely imaged on the study.  The cardiomediastinal silhouette remains normal in size. No acute osseous abnormalities are identified.  IMPRESSION: 1. Left IJ line seen ending about the mid to distal SVC. 2. Mild left midlung and left basilar airspace opacity raises concern for mild pneumonia, more defined than on the prior study. Minimal right basilar opacity also seen.   Electronically Signed   By: Roanna Raider M.D.   On: 06/19/2014 03:41   Dg Chest Port 1 View  06/18/2014   CLINICAL DATA:  Endotracheal tube placement. Recent CPR, with respiratory distress. Initial encounter.  EXAM: PORTABLE CHEST - 1 VIEW  COMPARISON:  CT of the chest performed 06/17/2014, and chest radiograph performed 06/16/2014  FINDINGS: The patient's endotracheal tube is seen ending 3 cm above the carina.  There is mild elevation of the left hemidiaphragm. Left basilar airspace opacification may reflect atelectasis or possibly pneumonia. Given recent CPR, aspiration cannot be excluded. The right lung appears clear. No definite pleural effusion or pneumothorax is seen.  The cardiomediastinal silhouette remains borderline normal in size. No acute  osseous abnormalities are seen.  IMPRESSION: 1. Endotracheal tube seen ending 3 cm above the carina. 2. Mild elevation of the left hemidiaphragm. Left basilar airspace opacification may reflect atelectasis or possibly pneumonia. Given the recent history, aspiration cannot be excluded.   Electronically Signed   By: Roanna Raider M.D.   On: 06/18/2014 22:16   Dg Chest Port 1 View  06/16/2014   CLINICAL DATA:  Altered mental status.  EXAM: PORTABLE CHEST - 1 VIEW  COMPARISON:  Single view of the chest 05/03/2014 and 04/30/2014. PA and lateral chest 05/15/2010.  FINDINGS: Mild subsegmental atelectasis or scar in the right lung base appears unchanged. The left lung is clear. There is no pneumothorax or pleural effusion. Heart size is upper normal. There is no evidence of pulmonary edema. Scoliosis is noted.  IMPRESSION: No acute finding with mild subsegmental atelectasis or scar on the right base unchanged.   Electronically Signed   By: Drusilla Kanner M.D.   On: 06/16/2014 11:53   Dg Abd Portable 1v  06/19/2014   CLINICAL DATA:  OG tube placement.  EXAM: PORTABLE ABDOMEN - 1 VIEW  COMPARISON:  CT earlier this day.  FINDINGS: The tip and side port of the enteric tube are below the diaphragm in the stomach. There is a nonobstructive bowel gas pattern. No evidence of free air.  IMPRESSION: Tip and side port of the enteric tube below the diaphragm in the stomach.   Electronically Signed   By: Rubye Oaks M.D.   On: 06/19/2014 02:43   Dg Swallowing Func-speech Pathology  06/23/2014   Chales Abrahams, CCC-SLP     06/23/2014  2:25 PM  Objective Swallowing Evaluation: Modified Barium Swallowing Study   Patient Details  Name: Constantina Laseter MRN: [id number] Date of Birth: 08/14/1940  Today's Date: 06/23/2014 Time: SLP Start Time (ACUTE ONLY): 1230-SLP Stop  Time (ACUTE  ONLY): 1305 SLP Time Calculation (min) (ACUTE ONLY): 35 min  Past Medical History:  Past Medical History  Diagnosis Date  . Hypertension   . Bipolar  1 disorder   . Hypothyroidism   . Memory difficulties   . HLD (hyperlipidemia)   . Diverticulitis large intestine   . Pneumonia    Past Surgical History:  Past Surgical History  Procedure Laterality Date  . No past surgeries     HPI:  HPI: 74 yo female adm to [facility] with fever and AMS. Pt found to have  recurrent pna- CXR showed increased bilateral opacities - ATX.   During hospital stay, pt became lethargic and had agonal  respirations - code blue called and pt received CPR and was  intubated on 1/15-1/19.  Swallow evaluation ordered s/p  extubation.  Pt had a clinical swallow evaluation during previous  admission and was placed on dys3/nectar diet.    No Data Recorded  Assessment / Plan / Recommendation CHL IP CLINICAL IMPRESSIONS 06/23/2014  Dysphagia Diagnosis Moderate oral phase dysphagia;Moderate  pharyngeal phase dysphagia  Clinical impression Pt with moderate oropharyngeal dysphagia  *suspect esophageal component as well. Poor oral control due to  weakness, lingual thrusting results in delayed transiting,  anterior spillage of liquids and residuals after swallow across  consistencies. Pharyngeal swallow characterized by mildly  delayed swallow with laryngeal penetration/trace silent  aspiration of thin liquids as barium spilled into open larynx  prior to swallow. Pt also appears with decreased clearance of  esophagus distal to proximal region without pt  awareness/sensation. (proximal esophagus appeared dilated) Water  via tsp consumption facilitated clearance but with appearance of  backflow. Radiologist not present to confirm findings. Although  pt did not aspirate with nectar, puree or cracker she is chronic  aspiration risk due to suspected multifactorial dysphagia, gross  weakness and multiple co-morbidities. Pt reports she prefers  nectar thickened liquid as she coughs less with nectar vs  thin.MD may desire to continue liquids only (? Full vs clears)-  nectar thickened with accepted aspiration risks  and strategies to  mitigate risk.   Using live video, provided pt with  compensation strategies.        CHL IP TREATMENT RECOMMENDATION 06/23/2014  Treatment Plan Recommendations F/U MBS in --- days (Comment)     CHL IP DIET RECOMMENDATION 06/23/2014  Diet Recommendations Nectar-thick liquid  Liquid Administration via Cup;Spoon;No straw  Medication Administration Whole meds with puree  Compensations Slow rate;Small sips/bites  Postural Changes and/or Swallow Maneuvers Seated upright 90  degrees;Upright 30-60 min after meal     CHL IP OTHER RECOMMENDATIONS 06/23/2014  Recommended Consults (None)  Oral Care Recommendations Oral care BID  Other Recommendations Order thickener from pharmacy;Prohibited  food (jello, ice cream, thin soups)     CHL IP FOLLOW UP RECOMMENDATIONS 06/23/2014  Follow up Recommendations Skilled Nursing facility     [facility] IP FREQUENCY AND DURATION 06/23/2014  Speech Therapy Frequency (ACUTE ONLY) min 2x/week  Treatment Duration 2 weeks     Pertinent Vitals/Pain Afebrile, decreased    SLP Swallow Goals No flowsheet data found.  No flowsheet data found.    CHL IP REASON FOR REFERRAL 06/23/2014  Reason for Referral Objectively evaluate swallowing function     CHL IP ORAL PHASE 06/23/2014  Lips (None)  Tongue (None)  Mucous membranes (None)  Nutritional status (None)  Other (None)  Oxygen therapy (None)  Oral Phase Impaired  Oral - Pudding Teaspoon (None) Oral - Pudding Cup (  None)  Oral - Honey Teaspoon (None)  Oral - Honey Cup (None)  Oral - Honey Syringe (None)  Oral - Nectar Teaspoon Right anterior bolus loss;Reduced  posterior propulsion;Piecemeal swallowing;Lingual/palatal  residue;Delayed oral transit;Lingual pumping;Incomplete tongue to  palate contact  Oral - Nectar Cup Weak lingual manipulation;Delayed oral  transit;Right anterior bolus loss;Reduced posterior  propulsion;Piecemeal swallowing;Lingual/palatal residue;Lingual  pumping;Incomplete tongue to palate contact  Oral - Nectar Straw Right  anterior bolus loss;Reduced posterior  propulsion;Piecemeal swallowing;Lingual/palatal residue;Delayed  oral transit;Lingual pumping;Incomplete tongue to palate contact  Oral - Nectar Syringe (None)  Oral - Ice Chips (None)  Oral - Thin Teaspoon Reduced posterior propulsion;Right anterior  bolus loss;Piecemeal swallowing;Lingual/palatal residue;Delayed  oral transit;Lingual pumping;Incomplete tongue to palate contact  Oral - Thin Cup Weak lingual manipulation;Delayed oral  transit;Reduced posterior propulsion;Right anterior bolus  loss;Piecemeal swallowing;Lingual/palatal residue;Lingual  pumping;Incomplete tongue to palate contact  Oral - Thin Straw Weak lingual manipulation;Delayed oral  transit;Right anterior bolus loss;Reduced posterior  propulsion;Piecemeal swallowing;Lingual/palatal residue;Lingual  pumping;Incomplete tongue to palate contact  Oral - Thin Syringe (None)  Oral - Puree Weak lingual manipulation;Delayed oral  transit;Piecemeal swallowing;Lingual/palatal residue;Lingual  pumping;Incomplete tongue to palate contact;Reduced posterior  propulsion  Oral - Mechanical Soft Weak lingual manipulation;Delayed oral  transit;Piecemeal swallowing;Lingual/palatal residue;Lingual  pumping;Incomplete tongue to palate contact;Reduced posterior  propulsion;Impaired mastication  Oral - Regular (None)  Oral - Multi-consistency (None)  Oral - Pill Weak lingual manipulation;Delayed oral  transit;Piecemeal swallowing;Lingual/palatal residue;Lingual  pumping;Reduced posterior propulsion  Oral Phase - Comment cued dry swallows decrease residuals      CHL IP PHARYNGEAL PHASE 06/23/2014  Pharyngeal Phase Impaired  Pharyngeal - Pudding Teaspoon (None)  Penetration/Aspiration details (pudding teaspoon) (None)  Pharyngeal - Pudding Cup (None)  Penetration/Aspiration details (pudding cup) (None)  Pharyngeal - Honey Teaspoon (None)  Penetration/Aspiration details (honey teaspoon) (None)  Pharyngeal - Honey Cup (None)   Penetration/Aspiration details (honey cup) (None)  Pharyngeal - Honey Syringe (None)  Penetration/Aspiration details (honey syringe) (None)  Pharyngeal - Nectar Teaspoon Reduced pharyngeal peristalsis  Penetration/Aspiration details (nectar teaspoon) (None)  Pharyngeal - Nectar Cup Reduced pharyngeal  peristalsis;Penetration/Aspiration before swallow  Penetration/Aspiration details (nectar cup) Material enters  airway, remains ABOVE vocal cords then ejected out  Pharyngeal - Nectar Straw Reduced pharyngeal  peristalsis;Penetration/Aspiration before swallow  Penetration/Aspiration details (nectar straw) Material enters  airway, remains ABOVE vocal cords then ejected out  Pharyngeal - Nectar Syringe (None)  Penetration/Aspiration details (nectar syringe) (None)  Pharyngeal - Ice Chips (None)  Penetration/Aspiration details (ice chips) (None)  Pharyngeal - Thin Teaspoon Reduced pharyngeal  peristalsis;Pharyngeal residue - valleculae  Penetration/Aspiration details (thin teaspoon) (None)  Pharyngeal - Thin Cup Reduced pharyngeal  peristalsis;Penetration/Aspiration before swallow  Penetration/Aspiration details (thin cup) Material enters airway,  passes BELOW cords without attempt by patient to eject out  (silent aspiration)  Pharyngeal - Thin Straw Reduced pharyngeal peristalsis;Trace  aspiration;Penetration/Aspiration before swallow  Penetration/Aspiration details (thin straw) Material enters  airway, passes BELOW cords without attempt by patient to eject  out (silent aspiration)  Pharyngeal - Thin Syringe (None)  Penetration/Aspiration details (thin syringe') (None)  Pharyngeal - Puree Reduced pharyngeal peristalsis;Pharyngeal  residue - valleculae  Penetration/Aspiration details (puree) (None)  Pharyngeal - Mechanical Soft (None)  Penetration/Aspiration details (mechanical soft) (None)  Pharyngeal - Regular Reduced pharyngeal peristalsis  Penetration/Aspiration details (regular) (None)  Pharyngeal - Multi-consistency  (None) Penetration/Aspiration  details (multi-consistency) (None)  Pharyngeal - Pill NT  Penetration/Aspiration details (pill) (None)  Pharyngeal Comment cued dry swallows decrease residuals     CHL IP CERVICAL  ESOPHAGEAL PHASE 06/23/2014 Cervical Esophageal  Phase Impaired  Pudding Teaspoon (None)  Pudding Cup (None)  Honey Teaspoon (None)  Honey Cup (None)  Honey Syringe (None)  Nectar Teaspoon (None)  Nectar Cup (None)  Nectar Straw (None)  Nectar Syringe (None)  Thin Teaspoon (None)  Thin Cup (None)  Thin Straw (None)  Thin Syringe (None)  Cervical Esophageal Comment appearance of    No flowsheet data found.         Donavan Burnet, MS [facility] SLP [phone]    Dg Lumbar Puncture Fluoro Guide  06/16/2014   CLINICAL DATA:  Mental status changes.  Unresponsive.  Fever.  EXAM: DIAGNOSTIC LUMBAR PUNCTURE UNDER FLUOROSCOPIC GUIDANCE  FLUOROSCOPY TIME:  1 min and 47 seconds  PROCEDURE: Informed consent was obtained from the the patient's son (secondary to patient's mental status changes) prior to the procedure, including potential complications of headache, allergy, and pain. With the patient prone, the lower back was prepped with Betadine. 1% Lidocaine was used for local anesthesia. Lumbar puncture was performed at the L2-3 level using a 20 gauge needle with return of clear CSF with an opening pressure of 27 cm water. 9-62ml of CSF were obtained for laboratory studies. The patient tolerated the procedure well and there were no apparent complications.  IMPRESSION: Uncomplicated lumbar puncture, as detailed above.  Opening pressure of approximately 27 cm of CSF.   Electronically Signed   By: Jeronimo Greaves M.D.   On: 06/16/2014 17:20    Microbiology: Urine culture     Status: None   Collection Time: 06/16/14 11:38 AM  Result Value Ref Range Status   Specimen Description URINE, CATHETERIZED  Final   Special Requests NONE  Final   Culture NO GROWTH  Final   Report Status 06/17/2014 FINAL  Final  Culture, blood  (routine x 2)     Status: None   Collection Time: 06/16/14 11:53 AM  Result Value Ref Range Status   Specimen Description BLOOD RIGHT HAND  Final   Special Requests BOTTLES DRAWN AEROBIC AND ANAEROBIC 5CC  Final   Culture   Final    NO GROWTH 5 DAYS   Report Status 06/22/2014 FINAL  Final  Culture, blood (routine x 2)     Status: None   Collection Time: 06/16/14 11:53 AM  Result Value Ref Range Status   Specimen Description BLOOD LEFT HAND  Final   Special Requests BOTTLES DRAWN AEROBIC AND ANAEROBIC 4CC  Final   Culture   Final    NO GROWTH 5 DAYS   Report Status 06/22/2014 FINAL  Final  Gram stain     Status: None   Collection Time: 06/16/14  5:07 PM  Result Value Ref Range Status   Specimen Description CSF  Final   Special Requests NONE  Final   Gram Stain   Final    NO ORGANISMS SEEN NO WBC SEEN   Report Status 06/16/2014 FINAL  Final  CSF culture     Status: None   Collection Time: 06/16/14  5:07 PM  Result Value Ref Range Status   Specimen Description CSF  Final   Special Requests NONE  Final   Gram Stain   Final   Culture   Final    NO GROWTH 3 DAYS   Report Status 06/20/2014 FINAL  Final  MRSA PCR Screening     Status: Abnormal   Collection Time: 06/16/14  5:19 PM  Result Value Ref Range Status   MRSA by PCR POSITIVE (A) NEGATIVE  Final  Culture, respiratory (NON-Expectorated)     Status: None   Collection Time: 06/19/14 10:16 AM  Result Value Ref Range Status   Specimen Description ENDOTRACHEAL  Final   Special Requests NONE  Final   Gram Stain   Final   Culture   Final    FEW CANDIDA ALBICANS    Report Status 06/21/2014 FINAL  Final  Culture, blood (routine x 2)     Status: None (Preliminary result)   Collection Time: 06/20/14  4:55 PM  Result Value Ref Range Status   Specimen Description BLOOD RIGHT ANTECUBITAL  Final   Special Requests BOTTLES DRAWN AEROBIC AND ANAEROBIC 10CC EACH  Final   Culture   Final           BLOOD CULTURE RECEIVED NO GROWTH TO  DATE    Report Status PENDING  Incomplete  Culture, blood (routine x 2)     Status: None (Preliminary result)   Collection Time: 06/20/14  5:07 PM  Result Value Ref Range Status   Specimen Description BLOOD RIGHT ANTECUBITAL  Final   Special Requests BOTTLES DRAWN AEROBIC AND ANAEROBIC 10 CC EACH  Final   Culture   Final           BLOOD CULTURE RECEIVED NO GROWTH TO DATE    Report Status PENDING  Incomplete     Labs: Basic Metabolic Panel:  Recent Labs Lab 06/19/14 0400  06/20/14 0500  06/21/14 0550 06/22/14 0420 06/23/14 0500 06/24/14 0425 06/25/14 0625  NA 150*  < >  --   < > 159* 152* 152* 152* 149*  K 3.2*  < >  --   < > 3.7 4.0 3.5 3.3* 3.7  CL 127*  < >  --   < > 128* 121* 119* 123* 115*  CO2 23  < >  --   < > 25 23 27 26 27   GLUCOSE 206*  < >  --   < > 149* 268* 121* 93 129*  BUN 33*  < >  --   < > 37* 40* 36* 26* 20  CREATININE 1.94*  < >  --   < > 1.37* 1.31* 1.14* 1.03 1.03  CALCIUM 8.3*  < >  --   < > 8.9 9.0 8.9 8.1* 8.9  MG 2.2  --  2.0  --   --   --   --   --   --   PHOS  --   --  2.2*  --   --   --   --   --   --   < > = values in this interval not displayed. Liver Function Tests:  Recent Labs Lab 06/18/14 2211 06/20/14 0500  AST 70* 27  ALT 56* 31  ALKPHOS 83 52  BILITOT 0.7 0.7  PROT 6.2 4.9*  ALBUMIN 3.1* 2.5*   No results for input(s): LIPASE, AMYLASE in the last 168 hours. No results for input(s): AMMONIA in the last 168 hours. CBC:  Recent Labs Lab 06/21/14 0550 06/22/14 0420 06/23/14 0500 06/24/14 0425 06/25/14 0625  WBC 9.5 9.0 8.0 7.2 6.5  NEUTROABS  --  8.4* 6.2  --   --   HGB 10.0* 9.8* 9.6* 9.5* 10.2*  HCT 33.4* 33.6* 32.3* 31.5* 34.8*  MCV 103.7* 104.7* 103.5* 102.6* 103.9*  PLT 100* 110* 139* 150 183   Cardiac Enzymes:  Recent Labs Lab 06/18/14 1357 06/18/14 2211  CKTOTAL 91  --   CKMB 2.4  --  TROPONINI  --  0.09*   BNP: BNP (last 3 results) No results for input(s): PROBNP in the last 8760  hours. CBG:  Recent Labs Lab 06/24/14 1653 06/24/14 2018 06/24/14 2331 06/25/14 0512 06/25/14 0746  GLUCAP 105* 166* 137* 109* 121*    Time coordinating discharge: Over 30 minutes

## 2014-06-25 NOTE — Progress Notes (Deleted)
Clinical Social Work Department CLINICAL SOCIAL WORK PLACEMENT NOTE 06/25/2014  Patient: [name] [name],[name] [name] Account Number: [id number] Admit date: 06/24/2014  Clinical Social Worker: Beauty Pless FOLEY, LCSWA Date/time: 06/25/2014 11:16 AM  Clinical Social Work is seeking post-discharge placement for this patient at the following level of care: SKILLED NURSING (*CSW will update this form in Epic as items are completed)   06/25/2014 Patient/family provided with [facility] Department of Clinical Social Work's list of facilities offering this level of care within the geographic area requested by the patient (or if unable, by the patient's family).  06/25/2014 Patient/family informed of their freedom to choose among providers that offer the needed level of care, that participate in Medicare, Medicaid or managed care program needed by the patient, have an available bed and are willing to accept the patient.  06/25/2014 Patient/family informed of [facility]' ownership interest in [facility], as well as of the fact that they are under no obligation to receive care at this facility.  PASARR submitted to EDS on 06/25/2014 PASARR number received on 06/25/2014  FL2 transmitted to all facilities in geographic area requested by pt/family on 06/25/2014 FL2 transmitted to all facilities within larger geographic area on   Patient informed that his/her managed care company has contracts with or will negotiate with certain facilities, including the following:    Patient/family informed of bed offers received: 06/25/2014 Patient chooses bed at [facility] Physician recommends and patient chooses bed at   Patient to be transferred to [facility] on  Patient to be transferred to facility by  Patient and family notified of transfer on  Name of family member notified:   The following physician request were entered in Epic:   Additional  Comments:    Chantella Creech, LCSW [facility] Clinical Social Worker cell #: [phone] 

## 2014-06-25 NOTE — Progress Notes (Deleted)
  Clinical Social Work Department BRIEF PSYCHOSOCIAL ASSESSMENT 06/25/2014  Patient:  [name] [name],[name] [name]     Account Number:  [id number]     Admit date:  06/24/2014  Clinical Social Worker:  Orpah GreekFOLEY,Trystian Crisanto, LCSWA  Date/Time:  06/25/2014 11:12 AM  Referred by:  Physician  Date Referred:  06/25/2014 Referred for  SNF Placement   Other Referral:   Interview type:  Patient Other interview type:   and daughter, Huntley DecSara at bedside    PSYCHOSOCIAL DATA Living Status:  ALONE Admitted from facility:   Level of care:   Primary support name:  Jonnie FinnerSara [name] (daughter) [name]#: [phone] Primary support relationship to patient:  CHILD, ADULT Degree of support available:   good    CURRENT CONCERNS Current Concerns  Post-Acute Placement   Other Concerns:    SOCIAL WORK ASSESSMENT / PLAN CSW received consult for SNF placement.   Assessment/plan status:  [facility] to [facility] Other assessment/ plan:   Information/referral to community resources:   CSW completed FL2 and [facility].    PATIENT'S/FAMILY'S RESPONSE TO PLAN OF CARE: CSW provided patient & daughter with SNF list and bed offers, patient accepted bed at [facility] - CSW confirmed with Lif[facility] @ [facility] that they would be able to take patient when ready.         Lincoln MaxinKelly Naomii Kreger, LCSW [facility] Clinical Social Worker cell #: [phone]

## 2014-06-25 NOTE — Progress Notes (Signed)
Patient's foley cath leaking two different times overnight.  Re-inflated balloon adding about 5 more cc of NS.  Will continue to monitor patient.   Manson PasseyBrown, Shekera Beavers Cherie

## 2014-06-25 NOTE — Discharge Instructions (Signed)
Pneumonia °Pneumonia is an infection of the lungs.  °CAUSES °Pneumonia may be caused by bacteria or a virus. Usually, these infections are caused by breathing infectious particles into the lungs (respiratory tract). °SIGNS AND SYMPTOMS  °· Cough. °· Fever. °· Chest pain. °· Increased rate of breathing. °· Wheezing. °· Mucus production. °DIAGNOSIS  °If you have the common symptoms of pneumonia, your health care provider will typically confirm the diagnosis with a chest X-ray. The X-ray will show an abnormality in the lung (pulmonary infiltrate) if you have pneumonia. Other tests of your blood, urine, or sputum may be done to find the specific cause of your pneumonia. Your health care provider may also do tests (blood gases or pulse oximetry) to see how well your lungs are working. °TREATMENT  °Some forms of pneumonia may be spread to other people when you cough or sneeze. You may be asked to wear a mask before and during your exam. Pneumonia that is caused by bacteria is treated with antibiotic medicine. Pneumonia that is caused by the influenza virus may be treated with an antiviral medicine. Most other viral infections must run their course. These infections will not respond to antibiotics.  °HOME CARE INSTRUCTIONS  °· Cough suppressants may be used if you are losing too much rest. However, coughing protects you by clearing your lungs. You should avoid using cough suppressants if you can. °· Your health care provider may have prescribed medicine if he or she thinks your pneumonia is caused by bacteria or influenza. Finish your medicine even if you start to feel better. °· Your health care provider may also prescribe an expectorant. This loosens the mucus to be coughed up. °· Take medicines only as directed by your health care provider. °· Do not smoke. Smoking is a common cause of bronchitis and can contribute to pneumonia. If you are a smoker and continue to smoke, your cough may last several weeks after your  pneumonia has cleared. °· A cold steam vaporizer or humidifier in your room or home may help loosen mucus. °· Coughing is often worse at night. Sleeping in a semi-upright position in a recliner or using a couple pillows under your head will help with this. °· Get rest as you feel it is needed. Your body will usually let you know when you need to rest. °PREVENTION °A pneumococcal shot (vaccine) is available to prevent a common bacterial cause of pneumonia. This is usually suggested for: °· People over 65 years old. °· Patients on chemotherapy. °· People with chronic lung problems, such as bronchitis or emphysema. °· People with immune system problems. °If you are over 65 or have a high risk condition, you may receive the pneumococcal vaccine if you have not received it before. In some countries, a routine influenza vaccine is also recommended. This vaccine can help prevent some cases of pneumonia. You may be offered the influenza vaccine as part of your care. °If you smoke, it is time to quit. You may receive instructions on how to stop smoking. Your health care provider can provide medicines and counseling to help you quit. °SEEK MEDICAL CARE IF: °You have a fever. °SEEK IMMEDIATE MEDICAL CARE IF:  °· Your illness becomes worse. This is especially true if you are elderly or weakened from any other disease. °· You cannot control your cough with suppressants and are losing sleep. °· You begin coughing up blood. °· You develop pain which is getting worse or is uncontrolled with medicines. °· Any of the symptoms   which initially brought you in for treatment are getting worse rather than better.  You develop shortness of breath or chest pain. MAKE SURE YOU:   Understand these instructions.  Will watch your condition.  Will get help right away if you are not doing well or get worse. Document Released: 05/21/2005 Document Revised: 10/05/2013 Document Reviewed: 08/10/2010 [facility] Patient Information 2015  [place], [place]. This information is not intended to replace advice given to you by your health care provider. Make sure you discuss any questions you have with your health care provider. Hypernatremia Hypernatremia is when there is too much salt (sodium) in the blood and not enough water. The cells of the brain become starved of water. This balance of water and salt in the body is vital to human function. Hypernatremia is rare and is often an emergency. It can happen to anyone, but it usually occurs in infants, the elderly, or the critically ill. In severe cases, hypernatremia can be life-threatening. CAUSES Hypernatremia may be the result of:  Not drinking enough water. Impaired thirst sensation usually contributes to this cause.  A condition where your kidneys remove too much urine from the body (polyuria). This is often related to diabetes.  Water loss through exercise or sweating.  Diarrhea or vomiting.  Excessive salt intake.  A genetic condition affecting the kidneys. SYMPTOMS  Increased thirst (unless this sensation is impaired).  Increased urination.  Muscle twitching or cramps.  Dizziness or lightheadedness.  Seizures.  Headache.  Fatigue.  Generalized weakness.  Irritability.  Confusion.  Loss of consciousness.  Coma. Symptoms of underlying illness that can lead to hypernatremia may also be present, such as:  Nausea.  Vomiting.  Diarrhea.  Fever.  Confusion (delirium).  Memory problems (dementia).  Diabetes.  A condition that affects the adrenal glands and results in too much cortisol (Cushing's syndrome). DIAGNOSIS  Your caregiver will take your history and ask questions regarding water intake, potential water loss, urination, salt intake, medicines, and other symptoms. Your caregiver may also perform a physical or neurological exam. He or she may also order tests, such as blood and urine tests. TREATMENT Once diagnosed, hypernatremia needs to  be treated right away. The goal is to balance the water and salt levels in the body. Treatment may be given through an intravenous line (IV) or given by mouth. Treatment often requires hospitalization for IV fluids and careful monitoring of lab tests. Treatment for hypernatremia will include intake of one or more of the following:  Water.  IV fluids. Ongoing laboratory tests will be performed to help guide treatment options. HOME CARE INSTRUCTIONS   Drink enough fluids to keep your urine clear or pale yellow.  Take all medicines as directed. Review all of your medicines with your caregiver regularly.  Avoid high-salt processed foods (canned, jarred, frozen, or boxed foods). Some examples include pickles, frozen dinners, canned soups, potato and corn chips, and olives.  Always replace fluids after exercise or after vomiting or diarrhea.  Manage underlying conditions that may cause hypernatremia. Ask your caregiver for additional support.  Follow up with your caregiver for ongoing monitoring as directed. SEEK MEDICAL CARE IF:  You have questions or concerns. MAKE SURE YOU:  Understand these instructions.  Will watch your condition.  Will get help right away if you are not doing well or get worse. Document Released: 08/13/2011 Document Reviewed: 08/13/2011 [facility] Patient Information 2015 [place], [place]. This information is not intended to replace advice given to you by your health care provider.  Make sure you discuss any questions you have with your health care provider. ° ° °

## 2014-06-26 LAB — CULTURE, BLOOD (ROUTINE X 2)
Culture: NO GROWTH
Culture: NO GROWTH

## 2014-07-26 ENCOUNTER — Emergency Department (HOSPITAL_COMMUNITY): Payer: Medicare Other

## 2014-07-26 ENCOUNTER — Inpatient Hospital Stay (HOSPITAL_COMMUNITY)
Admission: EM | Admit: 2014-07-26 | Discharge: 2014-08-06 | DRG: 871 | Disposition: A | Discharge status: Home / Self Care | Payer: Medicare Other | Attending: Family Medicine | Admitting: Family Medicine

## 2014-07-26 ENCOUNTER — Encounter (HOSPITAL_COMMUNITY): Payer: Self-pay | Admitting: Emergency Medicine

## 2014-07-26 DIAGNOSIS — T43591A Poisoning by other antipsychotics and neuroleptics, accidental (unintentional), initial encounter: Secondary | ICD-10-CM | POA: Diagnosis present

## 2014-07-26 DIAGNOSIS — R6510 Systemic inflammatory response syndrome (SIRS) of non-infectious origin without acute organ dysfunction: Secondary | ICD-10-CM | POA: Diagnosis present

## 2014-07-26 DIAGNOSIS — G9340 Encephalopathy, unspecified: Secondary | ICD-10-CM

## 2014-07-26 DIAGNOSIS — G92 Toxic encephalopathy: Secondary | ICD-10-CM | POA: Diagnosis present

## 2014-07-26 DIAGNOSIS — N183 Chronic kidney disease, stage 3 (moderate): Secondary | ICD-10-CM | POA: Diagnosis present

## 2014-07-26 DIAGNOSIS — I10 Essential (primary) hypertension: Secondary | ICD-10-CM | POA: Diagnosis present

## 2014-07-26 DIAGNOSIS — Z8249 Family history of ischemic heart disease and other diseases of the circulatory system: Secondary | ICD-10-CM | POA: Diagnosis not present

## 2014-07-26 DIAGNOSIS — R509 Fever, unspecified: Secondary | ICD-10-CM

## 2014-07-26 DIAGNOSIS — E870 Hyperosmolality and hypernatremia: Secondary | ICD-10-CM | POA: Diagnosis present

## 2014-07-26 DIAGNOSIS — I2510 Atherosclerotic heart disease of native coronary artery without angina pectoris: Secondary | ICD-10-CM | POA: Diagnosis present

## 2014-07-26 DIAGNOSIS — Z79899 Other long term (current) drug therapy: Secondary | ICD-10-CM | POA: Diagnosis not present

## 2014-07-26 DIAGNOSIS — N390 Urinary tract infection, site not specified: Secondary | ICD-10-CM | POA: Diagnosis present

## 2014-07-26 DIAGNOSIS — E039 Hypothyroidism, unspecified: Secondary | ICD-10-CM | POA: Diagnosis present

## 2014-07-26 DIAGNOSIS — D72829 Elevated white blood cell count, unspecified: Secondary | ICD-10-CM | POA: Diagnosis present

## 2014-07-26 DIAGNOSIS — E785 Hyperlipidemia, unspecified: Secondary | ICD-10-CM | POA: Diagnosis present

## 2014-07-26 DIAGNOSIS — I129 Hypertensive chronic kidney disease with stage 1 through stage 4 chronic kidney disease, or unspecified chronic kidney disease: Secondary | ICD-10-CM | POA: Diagnosis present

## 2014-07-26 DIAGNOSIS — R4182 Altered mental status, unspecified: Secondary | ICD-10-CM

## 2014-07-26 DIAGNOSIS — N179 Acute kidney failure, unspecified: Secondary | ICD-10-CM | POA: Diagnosis not present

## 2014-07-26 DIAGNOSIS — E860 Dehydration: Secondary | ICD-10-CM | POA: Diagnosis present

## 2014-07-26 DIAGNOSIS — N251 Nephrogenic diabetes insipidus: Secondary | ICD-10-CM | POA: Diagnosis present

## 2014-07-26 DIAGNOSIS — T50905A Adverse effect of unspecified drugs, medicaments and biological substances, initial encounter: Secondary | ICD-10-CM

## 2014-07-26 DIAGNOSIS — Z7982 Long term (current) use of aspirin: Secondary | ICD-10-CM | POA: Diagnosis not present

## 2014-07-26 DIAGNOSIS — A419 Sepsis, unspecified organism: Secondary | ICD-10-CM

## 2014-07-26 DIAGNOSIS — E861 Hypovolemia: Secondary | ICD-10-CM | POA: Diagnosis present

## 2014-07-26 DIAGNOSIS — F319 Bipolar disorder, unspecified: Secondary | ICD-10-CM | POA: Diagnosis present

## 2014-07-26 LAB — COMPREHENSIVE METABOLIC PANEL
ALBUMIN: 3.1 g/dL — AB (ref 3.5–5.2)
ALK PHOS: 119 U/L — AB (ref 39–117)
ALK PHOS: 94 U/L (ref 39–117)
ALT: 38 U/L — AB (ref 0–35)
ALT: 42 U/L — ABNORMAL HIGH (ref 0–35)
AST: 38 U/L — AB (ref 0–37)
AST: 43 U/L — AB (ref 0–37)
Albumin: 3.9 g/dL (ref 3.5–5.2)
Anion gap: 9 (ref 5–15)
Anion gap: 5 (ref 5–15)
BILIRUBIN TOTAL: 0.6 mg/dL (ref 0.3–1.2)
BUN: 19 mg/dL (ref 6–23)
BUN: 19 mg/dL (ref 6–23)
CALCIUM: 11.2 mg/dL — AB (ref 8.4–10.5)
CO2: 28 mmol/L (ref 19–32)
CO2: 26 mmol/L (ref 19–32)
Calcium: 9.9 mg/dL (ref 8.4–10.5)
Chloride: 120 mmol/L — ABNORMAL HIGH (ref 96–112)
Chloride: 125 mmol/L — ABNORMAL HIGH (ref 96–112)
Creatinine, Ser: 1.75 mg/dL — ABNORMAL HIGH (ref 0.50–1.10)
Creatinine, Ser: 1.61 mg/dL — ABNORMAL HIGH (ref 0.50–1.10)
GFR calc Af Amer: 35 mL/min — ABNORMAL LOW (ref >90)
GFR calc non Af Amer: 28 mL/min — ABNORMAL LOW (ref >90)
GFR calc non Af Amer: 30 mL/min — ABNORMAL LOW (ref >90)
GFR, EST AFRICAN AMERICAN: 32 mL/min — AB (ref >90)
Glucose, Bld: 118 mg/dL — ABNORMAL HIGH (ref 70–99)
Glucose, Bld: 107 mg/dL — ABNORMAL HIGH (ref 70–99)
POTASSIUM: 4.2 mmol/L (ref 3.5–5.1)
Potassium: 3.9 mmol/L (ref 3.5–5.1)
SODIUM: 156 mmol/L — AB (ref 135–145)
Sodium: 157 mmol/L — ABNORMAL HIGH (ref 135–145)
Total Bilirubin: 0.7 mg/dL (ref 0.3–1.2)
Total Protein: 7.3 g/dL (ref 6.0–8.3)
Total Protein: 5.9 g/dL — ABNORMAL LOW (ref 6.0–8.3)

## 2014-07-26 LAB — URINALYSIS, ROUTINE W REFLEX MICROSCOPIC
BILIRUBIN URINE: NEGATIVE
Glucose, UA: NEGATIVE mg/dL
Hgb urine dipstick: NEGATIVE
Ketones, ur: NEGATIVE mg/dL
Nitrite: NEGATIVE
PH: 7.5 (ref 5.0–8.0)
PROTEIN: NEGATIVE mg/dL
SPECIFIC GRAVITY, URINE: 1.01 (ref 1.005–1.030)
Urobilinogen, UA: 1 mg/dL (ref 0.0–1.0)

## 2014-07-26 LAB — I-STAT CG4 LACTIC ACID, ED: LACTIC ACID, VENOUS: 1.59 mmol/L (ref 0.5–2.0)

## 2014-07-26 LAB — CBC WITH DIFFERENTIAL/PLATELET
BASOS ABS: 0 10*3/uL (ref 0.0–0.1)
BASOS PCT: 0 % (ref 0–1)
EOS ABS: 0.1 10*3/uL (ref 0.0–0.7)
Eosinophils Relative: 1 % (ref 0–5)
HEMATOCRIT: 48.3 % — AB (ref 36.0–46.0)
Hemoglobin: 14.3 g/dL (ref 12.0–15.0)
Lymphocytes Relative: 9 % — ABNORMAL LOW (ref 12–46)
Lymphs Abs: 1.1 10*3/uL (ref 0.7–4.0)
MCH: 30 pg (ref 26.0–34.0)
MCHC: 29.6 g/dL — AB (ref 30.0–36.0)
MCV: 101.5 fL — AB (ref 78.0–100.0)
MONO ABS: 0.7 10*3/uL (ref 0.1–1.0)
Monocytes Relative: 6 % (ref 3–12)
NEUTROS PCT: 84 % — AB (ref 43–77)
Neutro Abs: 11.2 10*3/uL — ABNORMAL HIGH (ref 1.7–7.7)
Platelets: 332 10*3/uL (ref 150–400)
RBC: 4.76 MIL/uL (ref 3.87–5.11)
RDW: 12.9 % (ref 11.5–15.5)
WBC: 13.2 10*3/uL — ABNORMAL HIGH (ref 4.0–10.5)

## 2014-07-26 LAB — URINE MICROSCOPIC-ADD ON

## 2014-07-26 LAB — LITHIUM LEVEL: LITHIUM LVL: 1.16 mmol/L (ref 0.80–1.40)

## 2014-07-26 LAB — PHOSPHORUS: PHOSPHORUS: 4.1 mg/dL (ref 2.3–4.6)

## 2014-07-26 LAB — MAGNESIUM: MAGNESIUM: 2.3 mg/dL (ref 1.5–2.5)

## 2014-07-26 LAB — PROCALCITONIN

## 2014-07-26 LAB — CBG MONITORING, ED: Glucose-Capillary: 167 mg/dL — ABNORMAL HIGH (ref 70–99)

## 2014-07-26 MED ORDER — TEMAZEPAM 7.5 MG PO CAPS
7.5000 mg | ORAL_CAPSULE | Freq: Every day | ORAL | Status: DC
  Filled 2014-07-26: qty 1

## 2014-07-26 MED ORDER — PIPERACILLIN-TAZOBACTAM 3.375 G IVPB
3.3750 g | Freq: Once | INTRAVENOUS | Status: AC
  Administered 2014-07-26: 3.375 g via INTRAVENOUS
  Filled 2014-07-26: qty 50

## 2014-07-26 MED ORDER — BENZTROPINE MESYLATE 1 MG PO TABS
1.0000 mg | ORAL_TABLET | Freq: Every day | ORAL | Status: DC
  Filled 2014-07-26 (×3): qty 1

## 2014-07-26 MED ORDER — LITHIUM CARBONATE 300 MG PO CAPS
600.0000 mg | ORAL_CAPSULE | Freq: Every day | ORAL | Status: DC
  Administered 2014-07-26: 600 mg via ORAL
  Filled 2014-07-26 (×3): qty 2

## 2014-07-26 MED ORDER — VANCOMYCIN HCL 500 MG IV SOLR
500.0000 mg | INTRAVENOUS | Status: AC
  Administered 2014-07-26: 500 mg via INTRAVENOUS
  Filled 2014-07-26: qty 500

## 2014-07-26 MED ORDER — SIMVASTATIN 20 MG PO TABS
20.0000 mg | ORAL_TABLET | Freq: Every day | ORAL | Status: DC
  Administered 2014-07-26: 20 mg via ORAL
  Filled 2014-07-26 (×3): qty 1

## 2014-07-26 MED ORDER — B COMPLEX-C PO TABS
1.0000 | ORAL_TABLET | Freq: Every day | ORAL | Status: DC
  Administered 2014-08-01 – 2014-08-06 (×6): 1 via ORAL
  Filled 2014-07-26 (×13): qty 1

## 2014-07-26 MED ORDER — ACETAMINOPHEN 650 MG RE SUPP
650.0000 mg | RECTAL | Status: DC | PRN
  Administered 2014-07-26 – 2014-07-28 (×3): 650 mg via RECTAL
  Filled 2014-07-26 (×2): qty 1

## 2014-07-26 MED ORDER — SODIUM CHLORIDE 0.9 % IV BOLUS (SEPSIS)
1000.0000 mL | INTRAVENOUS | Status: AC
  Administered 2014-07-26 (×2): 1000 mL via INTRAVENOUS

## 2014-07-26 MED ORDER — BACID PO TABS
1.0000 | ORAL_TABLET | Freq: Two times a day (BID) | ORAL | Status: DC
  Administered 2014-07-26: 1 via ORAL
  Filled 2014-07-26 (×3): qty 1

## 2014-07-26 MED ORDER — VANCOMYCIN HCL IN DEXTROSE 750-5 MG/150ML-% IV SOLN
750.0000 mg | INTRAVENOUS | Status: DC
  Filled 2014-07-26: qty 150

## 2014-07-26 MED ORDER — PHENOL 1.4 % MT LIQD
2.0000 | OROMUCOSAL | Status: DC | PRN
  Filled 2014-07-26: qty 177

## 2014-07-26 MED ORDER — FERROUS SULFATE 325 (65 FE) MG PO TABS
325.0000 mg | ORAL_TABLET | Freq: Every day | ORAL | Status: DC
  Filled 2014-07-26 (×3): qty 1

## 2014-07-26 MED ORDER — CARBAMAZEPINE 200 MG PO TABS
200.0000 mg | ORAL_TABLET | Freq: Two times a day (BID) | ORAL | Status: DC
  Administered 2014-07-26: 200 mg via ORAL
  Filled 2014-07-26 (×7): qty 1

## 2014-07-26 MED ORDER — GUAIFENESIN-DM 100-10 MG/5ML PO SYRP
5.0000 mL | ORAL_SOLUTION | ORAL | Status: DC | PRN
  Administered 2014-08-06: 5 mL via ORAL
  Filled 2014-07-26: qty 10

## 2014-07-26 MED ORDER — LORAZEPAM 0.5 MG PO TABS: 0.5000 mg | ORAL_TABLET | Freq: Four times a day (QID) | ORAL | Status: DC | PRN

## 2014-07-26 MED ORDER — SODIUM CHLORIDE 0.9 % IV SOLN
INTRAVENOUS | Status: DC
  Administered 2014-07-26: 18:00:00 via INTRAVENOUS

## 2014-07-26 MED ORDER — ACETAMINOPHEN 650 MG RE SUPP
650.0000 mg | Freq: Four times a day (QID) | RECTAL | Status: DC | PRN
  Administered 2014-07-28: 650 mg via RECTAL
  Filled 2014-07-26 (×2): qty 1

## 2014-07-26 MED ORDER — ACETAMINOPHEN 325 MG PO TABS
650.0000 mg | ORAL_TABLET | Freq: Four times a day (QID) | ORAL | Status: DC | PRN
  Administered 2014-07-31 – 2014-08-06 (×4): 650 mg via ORAL
  Filled 2014-07-26 (×4): qty 2

## 2014-07-26 MED ORDER — LEVOTHYROXINE SODIUM 150 MCG PO TABS
150.0000 ug | ORAL_TABLET | Freq: Every day | ORAL | Status: DC
  Filled 2014-07-26 (×2): qty 1

## 2014-07-26 MED ORDER — ASPIRIN 81 MG PO CHEW
81.0000 mg | CHEWABLE_TABLET | Freq: Every day | ORAL | Status: DC
  Filled 2014-07-26 (×2): qty 1

## 2014-07-26 MED ORDER — ALBUTEROL SULFATE (2.5 MG/3ML) 0.083% IN NEBU: 2.5000 mg | INHALATION_SOLUTION | RESPIRATORY_TRACT | Status: DC | PRN

## 2014-07-26 MED ORDER — ONDANSETRON HCL 4 MG/2ML IJ SOLN: 4.0000 mg | Freq: Four times a day (QID) | INTRAMUSCULAR | Status: DC | PRN

## 2014-07-26 MED ORDER — AMLODIPINE BESYLATE 5 MG PO TABS
5.0000 mg | ORAL_TABLET | Freq: Every day | ORAL | Status: DC
  Filled 2014-07-26 (×3): qty 1

## 2014-07-26 MED ORDER — LITHIUM CARBONATE 300 MG PO CAPS
300.0000 mg | ORAL_CAPSULE | Freq: Every day | ORAL | Status: DC
  Filled 2014-07-26 (×3): qty 1

## 2014-07-26 MED ORDER — BIOTENE DRY MOUTH MT LIQD: 15.0000 mL | OROMUCOSAL | Status: DC | PRN

## 2014-07-26 MED ORDER — ONDANSETRON HCL 4 MG PO TABS: 4.0000 mg | ORAL_TABLET | Freq: Four times a day (QID) | ORAL | Status: DC | PRN

## 2014-07-26 MED ORDER — PIPERACILLIN-TAZOBACTAM 3.375 G IVPB
3.3750 g | Freq: Three times a day (TID) | INTRAVENOUS | Status: DC
  Filled 2014-07-26 (×3): qty 50

## 2014-07-26 MED ORDER — HALOPERIDOL 2 MG PO TABS
2.0000 mg | ORAL_TABLET | Freq: Two times a day (BID) | ORAL | Status: DC
  Administered 2014-07-26: 2 mg via ORAL
  Filled 2014-07-26 (×5): qty 1

## 2014-07-26 MED ORDER — SODIUM CHLORIDE 0.9 % IJ SOLN
3.0000 mL | Freq: Two times a day (BID) | INTRAMUSCULAR | Status: DC
Start: 2014-07-26 — End: 2014-08-06
  Administered 2014-07-29 – 2014-08-06 (×11): 3 mL via INTRAVENOUS

## 2014-07-26 MED ORDER — SODIUM CHLORIDE 0.9 % IV BOLUS (SEPSIS)
500.0000 mL | INTRAVENOUS | Status: AC
  Administered 2014-07-26: 500 mL via INTRAVENOUS

## 2014-07-26 MED ORDER — VITAMIN D3 25 MCG (1000 UNIT) PO TABS
1000.0000 [IU] | ORAL_TABLET | Freq: Every day | ORAL | Status: DC
  Filled 2014-07-26 (×3): qty 1

## 2014-07-26 MED ORDER — TRAMADOL HCL 50 MG PO TABS: 50.0000 mg | ORAL_TABLET | Freq: Four times a day (QID) | ORAL | Status: DC | PRN

## 2014-07-26 MED ORDER — VANCOMYCIN HCL IN DEXTROSE 1-5 GM/200ML-% IV SOLN
1000.0000 mg | Freq: Once | INTRAVENOUS | Status: AC
  Administered 2014-07-26: 1000 mg via INTRAVENOUS
  Filled 2014-07-26: qty 200

## 2014-07-26 MED ORDER — SENNA 8.6 MG PO TABS
1.0000 | ORAL_TABLET | Freq: Two times a day (BID) | ORAL | Status: DC
  Filled 2014-07-26: qty 1

## 2014-07-26 MED ORDER — B COMPLEX VITAMINS PO CAPS
1.0000 | ORAL_CAPSULE | Freq: Every day | ORAL | Status: DC
Start: 2014-07-27 — End: 2014-07-26

## 2014-07-26 NOTE — ED Notes (Signed)
Called floor to give report. RN not available at this time, was told RN will call ED for report when she gets on the unit.

## 2014-07-26 NOTE — ED Notes (Signed)
Bed: FA21WA18 Expected date:  Expected time:  Means of arrival:  Comments: EMS-ALOC

## 2014-07-26 NOTE — ED Notes (Signed)
Patient now talking. Patient asked where she was and what my name was. RN made aware. Patient also answered yes and no questions for the tech.

## 2014-07-26 NOTE — Progress Notes (Signed)
ANTIBIOTIC CONSULT NOTE - INITIAL  Pharmacy Consult for Vancomycin, Zosyn Indication: rule out sepsis  Allergies  Allergen Reactions  . Antihistamine Decongestant [Triprolidine-Pse] Diarrhea  . Codeine Other (See Comments)    Gi upset     Patient Measurements: Weight: 168 lb (76.204 kg) Height: 5'8"  Vital Signs: Temp: 99.1 F (37.3 C) (02/22 1453) Temp Source: Rectal (02/22 1453) BP: 134/61 mmHg (02/22 1635) Pulse Rate: 78 (02/22 1635) Intake/Output from previous day:   Intake/Output from this shift: Total I/O In: -  Out: 150 [Urine:150]  Labs:  Recent Labs  07/26/14 1033  WBC 13.2*  HGB 14.3  PLT 332  CREATININE 1.75*   Estimated Creatinine Clearance: 28.5 mL/min (by C-G formula based on Cr of 1.75). No results for input(s): VANCOTROUGH, VANCOPEAK, VANCORANDOM, GENTTROUGH, GENTPEAK, GENTRANDOM, TOBRATROUGH, TOBRAPEAK, TOBRARND, AMIKACINPEAK, AMIKACINTROU, AMIKACIN in the last 72 hours.   Microbiology: No results found for this or any previous visit (from the past 720 hour(s)).  Medical History: Past Medical History  Diagnosis Date  . Hypertension   . Bipolar 1 disorder   . Hypothyroidism   . Memory difficulties   . HLD (hyperlipidemia)   . Diverticulitis large intestine   . Pneumonia     Assessment: 74 y/o F with PMH of HTN, bipolar disorder, hypothyroidism, HLD, memory difficulties who presents from NH with acute mental status changes.  Code sepsis initiated, fluid boluses and antibiotics initiated in the ED.  Pharmacy consulted to continue Vancomycin and Zosyn for sepsis.    2/22 >> Vancomycin >> 2/22 >> Zosyn >>  Tmax: 100.7 WBCs: elevated, 13.2K Renal: AKI, SCr 1.75, CrCl ~ 29 mL/min CG  2/22 blood x 2: sent 2/22 urine: sent  Goal of Therapy:  Vancomycin trough level 15-20 mcg/ml  Appropriate antibiotic dosing for renal function and indication Eradication of infection  Plan:   Vancomycin 500 mg IV x 1 STAT (in addition to 1 g given  in ED today), then 750 mg IV q24h.  Plan for Vancomycin trough level at steady state. Zosyn 3.375g IV q8h (infuse over 4 hours) Monitor renal function, cultures, clinical course.   Greer PickerelJigna Makyra Corprew, PharmD, BCPS Pager: [phone] 07/26/2014 5:02 PM

## 2014-07-26 NOTE — ED Provider Notes (Signed)
CSN: [id number]     Arrival date & time 07/26/14  1014 History   First MD Initiated Contact with Patient 07/26/14 1026     Chief Complaint  Patient presents with  . Altered Mental Status     (Consider location/radiation/quality/duration/timing/severity/associated sxs/prior Treatment) HPI  A LEVEL 5 CAVEAT PERTAINS DUE TO ALTERED MENTAL STATUS Pt presents from nursing facility with altered/decreased mental status.  Per family report she has decreased mental status compared to several days ago when they last saw her.  She has had similar symptoms in the past with elevated sodium levels and elevated lithium levels.    Past Medical History  Diagnosis Date  . Hypertension   . Bipolar 1 disorder   . Hypothyroidism   . Memory difficulties   . HLD (hyperlipidemia)   . Diverticulitis large intestine   . Pneumonia    Past Surgical History  Procedure Laterality Date  . No past surgeries     Family History  Problem Relation Age of Onset  . Heart disease Father    History  Substance Use Topics  . Smoking status: Never Smoker   . Smokeless tobacco: Never Used  . Alcohol Use: No   OB History    No data available     Review of Systems  UNABLE TO OBTAIN ROS DUE TO LEVEL 5 CAVEAT    Allergies  Antihistamine decongestant and Codeine  Home Medications   Prior to Admission medications   Medication Sig Start Date End Date Taking? Authorizing Provider  albuterol (PROVENTIL) (2.5 MG/3ML) 0.083% nebulizer solution Take 3 mLs (2.5 mg total) by nebulization every 4 (four) hours as needed for wheezing. 06/25/14  Yes Alison Murray, MD  amLODipine (NORVASC) 5 MG tablet Take 5 mg by mouth daily with breakfast.    Yes Historical Provider, MD  aspirin 81 MG chewable tablet Chew 1 tablet (81 mg total) by mouth daily. 06/25/14  Yes Alison Murray, MD  b complex vitamins capsule Take 1 capsule by mouth daily with breakfast.   Yes Historical Provider, MD  benztropine (COGENTIN) 1 MG tablet Take 1  tablet (1 mg total) by mouth daily with breakfast. 06/25/14  Yes Alison Murray, MD  carbamazepine (TEGRETOL) 200 MG tablet Take 200 mg by mouth 2 (two) times daily.   Yes Historical Provider, MD  chlorhexidine (PERIDEX) 0.12 % solution 15 mLs by Mouth Rinse route 2 (two) times daily. 06/25/14  Yes Alison Murray, MD  cholecalciferol (VITAMIN D) 1000 UNITS tablet Take 1,000 Units by mouth daily with breakfast.   Yes Historical Provider, MD  ferrous sulfate 325 (65 FE) MG tablet Take 325 mg by mouth daily with breakfast.   Yes Historical Provider, MD  haloperidol (HALDOL) 2 MG tablet Take 2 mg by mouth 2 (two) times daily.   Yes Historical Provider, MD  lactobacillus acidophilus (BACID) TABS tablet Take 1 tablet by mouth 2 (two) times daily.   Yes Historical Provider, MD  levothyroxine (SYNTHROID, LEVOTHROID) 150 MCG tablet Take 150 mcg by mouth daily before breakfast.   Yes Historical Provider, MD  lithium carbonate 300 MG capsule Take 300-600 mg by mouth 2 (two) times daily with a meal. Take 300 mg every morning and 600 mg every night.   Yes Historical Provider, MD  LORazepam (ATIVAN) 0.5 MG tablet Take 1 tablet (0.5 mg total) by mouth every 6 (six) hours as needed for anxiety (and agitation). 06/25/14  Yes Alison Murray, MD  senna (SENOKOT) 8.6 MG TABS tablet  Take 1 tablet (8.6 mg total) by mouth 2 (two) times daily. 05/03/14  Yes Kathlen Mody, MD  simvastatin (ZOCOR) 20 MG tablet Take 20 mg by mouth at bedtime.    Yes Historical Provider, MD  temazepam (RESTORIL) 7.5 MG capsule Take 7.5 mg by mouth at bedtime.   Yes Historical Provider, MD  traMADol (ULTRAM) 50 MG tablet Take 0.5 tablets (25 mg total) by mouth every 6 (six) hours as needed (for pain). Patient taking differently: Take 50 mg by mouth every 6 (six) hours as needed (for pain).  06/25/14  Yes Alison Murray, MD  acetaminophen (TYLENOL) 160 MG/5ML solution Take 20.3 mLs (650 mg total) by mouth every 6 (six) hours as needed for mild pain,  headache or fever. Patient not taking: Reported on 07/26/2014 06/25/14   Alison Murray, MD  antiseptic oral rinse (BIOTENE) LIQD 15 mLs by Mouth Rinse route as needed for dry mouth.    Historical Provider, MD  feeding supplement, RESOURCE BREEZE, (RESOURCE BREEZE) LIQD Take 1 Container by mouth 3 (three) times daily between meals. Patient not taking: Reported on 07/26/2014 06/25/14   Alison Murray, MD  guaiFENesin-dextromethorphan [facility] DM) 100-10 MG/5ML syrup Take 5 mLs by mouth every 4 (four) hours as needed for cough.    Historical Provider, MD  Maltodextrin-Xanthan Gum (RESOURCE THICKENUP CLEAR) POWD Use as indicated. Patient not taking: Reported on 07/26/2014 05/03/14   Kathlen Mody, MD  Maltodextrin-Xanthan Gum (RESOURCE THICKENUP CLEAR) POWD Take 1 Container by mouth as needed. Patient not taking: Reported on 07/26/2014 06/25/14   Alison Murray, MD  pantoprazole sodium (PROTONIX) 40 mg/20 mL PACK Take 20 mLs (40 mg total) by mouth daily. Patient not taking: Reported on 07/26/2014 06/25/14   Alison Murray, MD  phenol (CHLORASEPTIC) 1.4 % LIQD Use as directed 2 sprays in the mouth or throat every 2 (two) hours as needed for throat irritation / pain.    Historical Provider, MD   BP 139/78 mmHg  Pulse 105  Temp(Src) 100 F (37.8 C) (Axillary)  Resp 22  Ht  (1.676 m)  Wt 138 lb 10.7 oz (62.9 kg)  BMI 22.39 kg/m2  SpO2 99%  Vitals reviewed Physical Exam  Physical Examination: General appearance - alert but not interactive, chronically ill appearing, and in no distress Mental status - alert, not answering orientation questions Eyes - pupils equal and reactive, extraocular eye movements intact Mouth - mucous membranes dry Chest - clear to auscultation, no wheezes, rales or rhonchi, symmetric air entry Heart - normal rate, regular rhythm, normal S1, S2, no murmurs, rubs, clicks or gallops Abdomen - soft, nontender, nondistended, no masses or organomegaly Neurological - alert, not  oriented, moves all extremities, pt not able to follow commands to assess neuro exam further Extremities - peripheral pulses normal, no pedal edema, no clubbing or cyanosis Skin - normal coloration and turgor, no rashes  ED Course  Procedures (including critical care time)  2:03 PM per family at bedside, pt has had elevation in sodium in the past as well as elevated lithium levels. They are unsure if she is currently receiving lithium at the nursing home or not.   4:23 PM d/w Dr. Elisabeth Pigeon for admission.  She requests med/surg bed, temporary admission orders written.  Lithium level 1.16 today.   Labs Review Labs Reviewed  MRSA PCR SCREENING - Abnormal; Notable for the following:    MRSA by PCR POSITIVE (*)    All other components within normal limits  CBC WITH DIFFERENTIAL/PLATELET - Abnormal; Notable for the following:    WBC 13.2 (*)    HCT 48.3 (*)    MCV 101.5 (*)    MCHC 29.6 (*)    Neutrophils Relative % 84 (*)    Neutro Abs 11.2 (*)    Lymphocytes Relative 9 (*)    All other components within normal limits  COMPREHENSIVE METABOLIC PANEL - Abnormal; Notable for the following:    Sodium 157 (*)    Chloride 120 (*)    Glucose, Bld 118 (*)    Creatinine, Ser 1.75 (*)    Calcium 11.2 (*)    AST 38 (*)    ALT 38 (*)    Alkaline Phosphatase 119 (*)    GFR calc non Af Amer 28 (*)    GFR calc Af Amer 32 (*)    All other components within normal limits  URINALYSIS, ROUTINE W REFLEX MICROSCOPIC - Abnormal; Notable for the following:    Color, Urine STRAW (*)    Leukocytes, UA TRACE (*)    All other components within normal limits  BASIC METABOLIC PANEL - Abnormal; Notable for the following:    Sodium 162 (*)    Chloride >130 (*)    Creatinine, Ser 1.54 (*)    GFR calc non Af Amer 32 (*)    GFR calc Af Amer 37 (*)    All other components within normal limits  COMPREHENSIVE METABOLIC PANEL - Abnormal; Notable for the following:    Sodium 156 (*)    Chloride 125 (*)     Glucose, Bld 107 (*)    Creatinine, Ser 1.61 (*)    Total Protein 5.9 (*)    Albumin 3.1 (*)    AST 43 (*)    ALT 42 (*)    GFR calc non Af Amer 30 (*)    GFR calc Af Amer 35 (*)    All other components within normal limits  TSH - Abnormal; Notable for the following:    TSH 0.097 (*)    All other components within normal limits  GLUCOSE, CAPILLARY - Abnormal; Notable for the following:    Glucose-Capillary 146 (*)    All other components within normal limits  COMPREHENSIVE METABOLIC PANEL - Abnormal; Notable for the following:    Sodium 167 (*)    Chloride >130 (*)    Glucose, Bld 160 (*)    BUN 30 (*)    Creatinine, Ser 2.02 (*)    Calcium 10.9 (*)    Total Bilirubin 0.2 (*)    GFR calc non Af Amer 23 (*)    GFR calc Af Amer 27 (*)    All other components within normal limits  MAGNESIUM - Abnormal; Notable for the following:    Magnesium 2.7 (*)    All other components within normal limits  CBC WITH DIFFERENTIAL/PLATELET - Abnormal; Notable for the following:    WBC 17.2 (*)    HCT 50.5 (*)    MCV 106.3 (*)    MCHC 28.3 (*)    Neutrophils Relative % 85 (*)    Neutro Abs 14.6 (*)    Lymphocytes Relative 7 (*)    Monocytes Absolute 1.3 (*)    All other components within normal limits  BASIC METABOLIC PANEL - Abnormal; Notable for the following:    Sodium 165 (*)    Chloride >130 (*)    Glucose, Bld 143 (*)    BUN 31 (*)    Creatinine, Ser  1.78 (*)    GFR calc non Af Amer 27 (*)    GFR calc Af Amer 31 (*)    All other components within normal limits  BASIC METABOLIC PANEL - Abnormal; Notable for the following:    Sodium 155 (*)    Potassium 3.2 (*)    Chloride 129 (*)    Glucose, Bld 152 (*)    BUN 32 (*)    Creatinine, Ser 1.61 (*)    GFR calc non Af Amer 30 (*)    GFR calc Af Amer 35 (*)    Anion gap 2 (*)    All other components within normal limits  CBC WITH DIFFERENTIAL/PLATELET - Abnormal; Notable for the following:    MCV 105.8 (*)    MCHC 28.3 (*)     Neutrophils Relative % 78 (*)    Neutro Abs 7.8 (*)    All other components within normal limits  CARBAMAZEPINE LEVEL, TOTAL - Abnormal; Notable for the following:    Carbamazepine Lvl <2.0 (*)    All other components within normal limits  BASIC METABOLIC PANEL - Abnormal; Notable for the following:    Sodium 163 (*)    Chloride >130 (*)    Glucose, Bld 125 (*)    BUN 30 (*)    Creatinine, Ser 1.49 (*)    GFR calc non Af Amer 33 (*)    GFR calc Af Amer 39 (*)    All other components within normal limits  GLUCOSE, CAPILLARY - Abnormal; Notable for the following:    Glucose-Capillary 113 (*)    All other components within normal limits  CBG MONITORING, ED - Abnormal; Notable for the following:    Glucose-Capillary 167 (*)    All other components within normal limits  CULTURE, BLOOD (ROUTINE X 2)  CULTURE, BLOOD (ROUTINE X 2)  URINE CULTURE  URINE CULTURE  URINE MICROSCOPIC-ADD ON  LITHIUM LEVEL  PROCALCITONIN  MAGNESIUM  PHOSPHORUS  GLUCOSE, CAPILLARY  PROCALCITONIN  URINALYSIS, ROUTINE W REFLEX MICROSCOPIC  MAGNESIUM  CARBAMAZEPINE LEVEL, FREE  BASIC METABOLIC PANEL  BASIC METABOLIC PANEL  BASIC METABOLIC PANEL  I-STAT CG4 LACTIC ACID, ED    Imaging Review US Renal  07/28/2014   CLINICAL DATA:  Acute renal failure.  History of hypertension.  EXAM: RENAL/URINARY TRACT ULTRASOUND COMPLETE  COMPARISON:  Abdomen and pelvis CT, 06/17/2014  FINDINGS: Right Kidney:  Length: 8.2 cm. Borderline increased parenchymal echogenicity. Mild cortical thinning mostly noted from the upper pole. No renal mass or stone. No hydronephrosis.  Left Kidney:  Length: 10 cm. Echogenicity within normal limits. No mass or hydronephrosis visualized.  Bladder:  Decompressed by a Foley catheter.  IMPRESSION: 1. No acute findings.  No hydronephrosis. 2. Mild reduction in the size of the right kidney with mild cortical thinning and borderline increased parenchymal echogenicity, consistent with medical  renal disease.   Electronically Signed   By: Amie Portland M.D.   On: 07/28/2014 16:29     EKG Interpretation None      MDM   Final diagnoses:  Altered mental status, unspecified altered mental status type  Hypernatremia  Leukocytosis  Fever, unspecified fever cause    Pt presenting with altered mental status, acute encephalopathy- pt found to be hyerpnatremic with worsening renal failure.  Lithium level reassuring.  Head CT negative.  Pt admitted to triad for further management.  Family updated about findings and plan.     Ethelda Chick, MD 07/29/14 [phone]

## 2014-07-26 NOTE — ED Notes (Signed)
[facility] EMS [facility] [place].  Day shift staff reported to EMS that she was at her baseline (ie walking, talking) on Friday when they left the facility and this morning pt not talking, not able to follow anything but very simple commands. The staff reports they got no report from weekend/night shift, so last known baseline is unknown. Pt is bipolar and has PMH sepsis. Pt was 90% on RA and placed on 2L O2.

## 2014-07-26 NOTE — ED Notes (Signed)
Called floor to give report. RN stated that she will call me for report in 10 mins.

## 2014-07-26 NOTE — H&P (Signed)
[facility] History and Physical  [name] [name] OFH:[id number] DOB: 1941/05/01 DOA: 07/26/2014  Referring physician: ER physician PCP: Osborne Casco, MD   Chief Complaint: altered mental status   HPI:  74 year old female with history of bipolar disorder, hypertension, dyslipidemia, hypothyroidism, CKD stage 3, from nursing home who presented to [facility] ED for evaluation of altered mental status. Patient at baseline talkative, able to communicate with others and last seen normal by family about 3 days prior to this admission. No reports of falls, no reports of respiratory distress, fevers, cough. No reports of vomiting.  On admission, BP was stable at 124/68, HR 99, RR 35, Tmax 100.7 F and oxygen saturation of 92 % on room air.Blood work showed WBC count 13.2, sodium 157,chloride 120,creatinine 1.75 and calcium 11.2.CT head did not show acute intracranial findings. CXR showed no acute cardiopulmonary abnormalities. UA showed trace leukocytes. She was started on broad spectrum abx for treatment of possible sepsis.   Assessment & Plan    Principal Problem: Acute encephalopathy - perhaps due to acute infection, sepsis due to UTI or hypernatremia and hypercalcemia  - will continue to monitor mental status - PT evaluation once pt able to participate   Active Problems: Sepsis due to UTI - sepsis criteria met on admission with tachycardia, tachypnea, fever. WBC count was 13.2 on admission and evidence of UTI based on UA collected in ED - pt started on broad spectrum abx, vanco and zosyn - follow up urine and blood culture results - continue IV fluids for next 24 hours, reassess in am if fluids still needed  Chronic kidney disease, stage 3 - creatinine about 1 month ago 1.3 and on this admission 1.7; likely acutely elevated due to sepsis - continue IV fluids - follow up BMP in am  Hypernatremia / Hypercalcemia / Dehydration - sodium 157,calcium 11.2 likely due to sepsis,  dehydration - continue IV fluids - follow up BMP in am    HTN (hypertension) - resume home meds: norvasc  Dyslipidemia - resume statin therapy   Hypothyroidism - check TSH - continue synthroid   Bipolar 1 disorder - Lithium level WNL - resume lithium, cogentin and tegretol    DVT prophylaxis:  - SCD's bilaterally   Radiological Exams on Admission: Ct Head Wo Contrast 07/26/2014  No acute intracranial abnormality.  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     Dg Chest Port 1 View  (if Code Sepsis Called) 07/26/2014    No acute cardiopulmonary abnormality seen.      Code Status: Full Family Communication: Family not at the bedside  Disposition Plan: Admit for further evaluation, telemetry admission due to sepsis   Leisa Lenz, MD  [facility] Pager [phone]  Review of Systems:  Constitutional: Negative for fever, chills and malaise/fatigue. Negative for diaphoresis.  HENT: Negative for hearing loss, ear pain, nosebleeds, congestion, sore throat, neck pain, tinnitus and ear discharge.   Eyes: Negative for blurred vision, double vision, photophobia, pain, discharge and redness.  Respiratory: Negative for cough, hemoptysis, sputum production, shortness of breath, wheezing and stridor.   Cardiovascular: Negative for chest pain, palpitations, orthopnea, claudication and leg swelling.  Gastrointestinal: Negative for nausea, vomiting and abdominal pain. Negative for heartburn, constipation, blood in stool and melena.  Genitourinary: Negative for dysuria, urgency, frequency, hematuria and flank pain.  Musculoskeletal: Negative for myalgias, back pain, joint pain and falls.  Skin: Negative for itching and rash.  Neurological:  per HPI Endo/Heme/Allergies: Negative for environmental allergies and polydipsia. Does not bruise/bleed easily.  Psychiatric/Behavioral: Negative for suicidal ideas. The patient is  not nervous/anxious.      Past Medical History  Diagnosis Date  . Hypertension   . Bipolar 1 disorder   . Hypothyroidism   . Memory difficulties   . HLD (hyperlipidemia)   . Diverticulitis large intestine   . Pneumonia    Past Surgical History  Procedure Laterality Date  . No past surgeries     Social History:  reports that she has never smoked. She has never used smokeless tobacco. She reports that she does not drink alcohol or use illicit drugs.  Allergies  Allergen Reactions  . Antihistamine Decongestant [Triprolidine-Pse] Diarrhea  . Codeine Other (See Comments)    Gi upset     Family History:  Family History  Problem Relation Age of Onset  . Heart disease Father      Prior to Admission medications   Medication Sig Start Date End Date Taking? Authorizing Provider  albuterol (PROVENTIL) (2.5 MG/3ML) 0.083% nebulizer solution Take 3 mLs (2.5 mg total) by nebulization every 4 (four) hours as needed for wheezing. 06/25/14  Yes Robbie Lis, MD  amLODipine (NORVASC) 5 MG tablet Take 5 mg by mouth daily with breakfast.    Yes Historical Provider, MD  aspirin 81 MG chewable tablet Chew 1 tablet (81 mg total) by mouth daily. 06/25/14  Yes Robbie Lis, MD  b complex vitamins capsule Take 1 capsule by mouth daily with breakfast.   Yes Historical Provider, MD  benztropine (COGENTIN) 1 MG tablet Take 1 tablet (1 mg total) by mouth daily with breakfast. 06/25/14  Yes Robbie Lis, MD  carbamazepine (TEGRETOL) 200 MG tablet Take 200 mg by mouth 2 (two) times daily.   Yes Historical Provider, MD  chlorhexidine (PERIDEX) 0.12 % solution 15 mLs by Mouth Rinse route 2 (two) times daily. 06/25/14  Yes Robbie Lis, MD  cholecalciferol (VITAMIN D) 1000 UNITS tablet Take 1,000 Units by mouth daily with breakfast.   Yes Historical Provider, MD  ferrous sulfate 325 (65 FE) MG tablet Take 325 mg by mouth daily with breakfast.   Yes Historical Provider, MD  haloperidol (HALDOL) 2 MG tablet  Take 2 mg by mouth 2 (two) times daily.   Yes Historical Provider, MD  lactobacillus acidophilus (BACID) TABS tablet Take 1 tablet by mouth 2 (two) times daily.   Yes Historical Provider, MD  levothyroxine (SYNTHROID, LEVOTHROID) 150 MCG tablet Take 150 mcg by mouth daily before breakfast.   Yes Historical Provider, MD  lithium carbonate 300 MG capsule Take 300-600 mg by mouth 2 (two) times daily with a meal. Take 300 mg every morning and 600 mg every night.   Yes Historical Provider, MD  LORazepam (ATIVAN) 0.5 MG tablet Take 1 tablet (0.5 mg total) by mouth every 6 (six) hours as needed for anxiety (and agitation). 06/25/14  Yes Robbie Lis, MD  senna (SENOKOT) 8.6 MG TABS tablet Take 1 tablet (8.6 mg total) by mouth 2 (two) times daily. 05/03/14  Yes Hosie Poisson, MD  simvastatin (ZOCOR) 20 MG tablet Take 20 mg by mouth at bedtime.    Yes Historical Provider, MD  temazepam (RESTORIL) 7.5 MG capsule Take 7.5 mg by mouth at bedtime.   Yes Historical Provider, MD  traMADol (ULTRAM) 50 MG tablet Take 0.5 tablets (25 mg total) by mouth every 6 (six) hours as needed (for pain). Patient taking differently: Take  50 mg by mouth every 6 (six) hours as needed (for pain).  06/25/14  Yes Robbie Lis, MD  acetaminophen (TYLENOL) 160 MG/5ML solution Take 20.3 mLs (650 mg total) by mouth every 6 (six) hours as needed for mild pain, headache or fever. Patient not taking: Reported on 07/26/2014 06/25/14   Robbie Lis, MD  antiseptic oral rinse (BIOTENE) LIQD 15 mLs by Mouth Rinse route as needed for dry mouth.    Historical Provider, MD  feeding supplement, RESOURCE BREEZE, (RESOURCE BREEZE) LIQD Take 1 Container by mouth 3 (three) times daily between meals. Patient not taking: Reported on 07/26/2014 06/25/14   Robbie Lis, MD  guaiFENesin-dextromethorphan [facility] DM) 100-10 MG/5ML syrup Take 5 mLs by mouth every 4 (four) hours as needed for cough.    Historical Provider, MD  Maltodextrin-Xanthan Gum ([place]) POWD Use as indicated. Patient not taking: Reported on 07/26/2014 05/03/14   Hosie Poisson, MD  Maltodextrin-Xanthan Gum ([place]) POWD Take 1 Container by mouth as needed. Patient not taking: Reported on 07/26/2014 06/25/14   Robbie Lis, MD  pantoprazole sodium (PROTONIX) 40 mg/20 mL PACK Take 20 mLs (40 mg total) by mouth daily. Patient not taking: Reported on 07/26/2014 06/25/14   Robbie Lis, MD  phenol (CHLORASEPTIC) 1.4 % LIQD Use as directed 2 sprays in the mouth or throat every 2 (two) hours as needed for throat irritation / pain.    Historical Provider, MD   Physical Exam: Filed Vitals:   07/26/14 1445 07/26/14 1452 07/26/14 1453 07/26/14 1530  BP:  138/79  144/71  Pulse: 80   79  Temp:   99.1 F (37.3 C)   TempSrc:   Rectal   Resp: 21 15  21   Weight:      SpO2: 96%   95%    Physical Exam  Constitutional: Appears well-developed and well-nourished. No distress.  HENT: Normocephalic. No tonsillar erythema or exudates Eyes: Conjunctivae and EOM are normal. PERRLA, no scleral icterus.  Neck: Normal ROM. Neck supple. No JVD. No tracheal deviation. No thyromegaly.  CVS: RRR, S1/S2 appreciated.  Pulmonary: Effort and breath sounds normal, no stridor, rhonchi, wheezes, rales.  Abdominal: Soft. BS +,  no distension, tenderness, rebound or guarding.  Musculoskeletal: Normal range of motion. No edema and no tenderness.  Lymphadenopathy: No lymphadenopathy noted, cervical, inguinal. Neuro: Awake. No focal neurologic deficits. Skin: Skin is warm and dry. No rash noted.   Psychiatric: Normal mood and affect.   Labs on Admission:  Basic Metabolic Panel:  Recent Labs Lab 07/26/14 1033  NA 157*  K 4.2  CL 120*  CO2 28  GLUCOSE 118*  BUN 19  CREATININE 1.75*  CALCIUM 11.2*   Liver Function Tests:  Recent Labs Lab 07/26/14 1033  AST 38*  ALT 38*  ALKPHOS 119*  BILITOT 0.7  PROT 7.3  ALBUMIN 3.9   No results for input(s): LIPASE,  AMYLASE in the last 168 hours. No results for input(s): AMMONIA in the last 168 hours. CBC:  Recent Labs Lab 07/26/14 1033  WBC 13.2*  NEUTROABS 11.2*  HGB 14.3  HCT 48.3*  MCV 101.5*  PLT 332   Cardiac Enzymes: No results for input(s): CKTOTAL, CKMB, CKMBINDEX, TROPONINI in the last 168 hours. BNP: Invalid input(s): POCBNP CBG:  Recent Labs Lab 07/26/14 1016  GLUCAP 167*    If 7PM-7AM, please contact night-coverage [email or web address] Password [facility] 07/26/2014, 4:27 PM

## 2014-07-26 NOTE — ED Notes (Addendum)
Call pt's son at [phone], Kerby LessMatt Jerger, HPOA, if any changes or when pt gets admitted.

## 2014-07-26 NOTE — ED Notes (Signed)
Pt's family states that when they visited her on Friday pt wasn't able to get up to her wheelchair like she had the Friday before.   Pt was here in Jan with similar symptoms and her sodium was up as well as her Lithium level and was started back on her Lithium at nursing facility once sodium level returned to normal.  Family feels this may be the reasoning for her state and would like her taken off the medication.  Made [facility] EDP aware.

## 2014-07-27 DIAGNOSIS — I10 Essential (primary) hypertension: Secondary | ICD-10-CM

## 2014-07-27 LAB — URINE CULTURE
Colony Count: NO GROWTH
Culture: NO GROWTH

## 2014-07-27 LAB — BASIC METABOLIC PANEL
BUN: 20 mg/dL (ref 6–23)
CALCIUM: 10.3 mg/dL (ref 8.4–10.5)
CO2: 26 mmol/L (ref 19–32)
Creatinine, Ser: 1.54 mg/dL — ABNORMAL HIGH (ref 0.50–1.10)
GFR calc Af Amer: 37 mL/min — ABNORMAL LOW (ref >90)
GFR calc non Af Amer: 32 mL/min — ABNORMAL LOW (ref >90)
Glucose, Bld: 95 mg/dL (ref 70–99)
Potassium: 3.9 mmol/L (ref 3.5–5.1)
SODIUM: 162 mmol/L — AB (ref 135–145)

## 2014-07-27 LAB — TSH: TSH: 0.097 u[IU]/mL — ABNORMAL LOW (ref 0.350–4.500)

## 2014-07-27 LAB — MRSA PCR SCREENING: MRSA by PCR: POSITIVE — AB

## 2014-07-27 LAB — GLUCOSE, CAPILLARY: Glucose-Capillary: 91 mg/dL (ref 70–99)

## 2014-07-27 MED ORDER — CEFTRIAXONE SODIUM IN DEXTROSE 20 MG/ML IV SOLN
1.0000 g | INTRAVENOUS | Status: DC
  Administered 2014-07-27 – 2014-07-28 (×2): 1 g via INTRAVENOUS
  Filled 2014-07-27 (×2): qty 50

## 2014-07-27 MED ORDER — CHLORHEXIDINE GLUCONATE CLOTH 2 % EX PADS
6.0000 | MEDICATED_PAD | Freq: Every day | CUTANEOUS | Status: AC
  Administered 2014-07-28 – 2014-07-31 (×4): 6 via TOPICAL

## 2014-07-27 MED ORDER — DEXTROSE-NACL 5-0.45 % IV SOLN
INTRAVENOUS | Status: DC
  Administered 2014-07-27 – 2014-07-28 (×2): via INTRAVENOUS

## 2014-07-27 MED ORDER — LACTINEX PO CHEW
1.0000 | CHEWABLE_TABLET | Freq: Two times a day (BID) | ORAL | Status: DC
  Filled 2014-07-27 (×4): qty 1

## 2014-07-27 MED ORDER — MUPIROCIN 2 % EX OINT
1.0000 "application " | TOPICAL_OINTMENT | Freq: Two times a day (BID) | CUTANEOUS | Status: AC
  Administered 2014-07-27 – 2014-07-31 (×10): 1 via NASAL
  Filled 2014-07-27: qty 22

## 2014-07-27 MED ORDER — LEVOTHYROXINE SODIUM 100 MCG IV SOLR
75.0000 ug | Freq: Every day | INTRAVENOUS | Status: DC
  Administered 2014-07-27: 75 ug via INTRAVENOUS
  Filled 2014-07-27 (×2): qty 5

## 2014-07-27 MED ORDER — DEXTROSE 5 % IV SOLN
INTRAVENOUS | Status: DC
  Administered 2014-07-27: 10:00:00 via INTRAVENOUS

## 2014-07-27 NOTE — Progress Notes (Addendum)
Clinical Social Work Department BRIEF PSYCHOSOCIAL ASSESSMENT 07/27/2014  Patient:  [name] [name]     Account Number:  [id number]     Admit date:  07/26/2014  Clinical Social Worker:  Dennison BullaGERBER,Katee Wentland, LCSW  Date/Time:  07/27/2014 01:30 PM  Referred by:  Physician  Date Referred:  07/27/2014 Referred for  SNF Placement   Other Referral:   Interview type:  Family Other interview type:   [name] [name]    PSYCHOSOCIAL DATA Living Status:  FACILITY Admitted from facility:  [name] Level of care:  Skilled Nursing Facility Primary support name:  [name] HazardMatthew Primary support relationship to patient:  CHILD, ADULT Degree of support available:   Strong    CURRENT CONCERNS Current Concerns  Post-Acute Placement   Other Concerns:    SOCIAL WORK ASSESSMENT / PLAN CSW received referral due to patient being admitted from a facility. CSW reviewed chart and attempted to meet with patient at bedside. Patient drowsy and unable to fully participate in assessment so CSW spoke with son [name] [name]([name] [name]) via phone.    [facility] was hospitalized in November 2015 [name]. Son reports patient was unable to meet goals and he did not feel that patient was able to return home so [name]. Son reports patient has been in and out of the hospital and is concerned about psychotropic medications. Son reports that prior to admission in November patient was active with an ACT team through [facility] to manage her bipolar disorder. Son reports that once patient became a LT resident, ACT was unable to continue to follow so son does not feel patient's medications have been managed effectively. Son requesting that MD order a psych consult or adjust medications because he believes that hospitalizations are related to psychotropic medications being restarted on 07/05/14. Son agreeable for patient to return to [name] at DC.    CSW completed FL2  and left a message with SNF re: patient returning at DC. CSW will continue to follow.   Assessment/plan status:  Psychosocial Support/Ongoing Assessment of Needs Other assessment/ plan:   Information/referral to community resources:   Will return to SNF    PATIENT'S/FAMILY'S RESPONSE TO PLAN OF CARE: Patient unable to participate in assessment. Patient's son engaged and involved during assessment. Son is most concerned about patient's MH needs and feels that her MH needs are not always addressed. Patient feels that medical problems and MH problems are related and feels that psych MD and medical MD need to consult with one another. Son reports that he wants patient to feel better but feels he has placed patient in the best environment possible for her safety. Son thanked CSW for call and has CSW contact information in case further needs arise.       [name] [name], [place] [phone]  Addendum [phone] SNF called and can accept patient back whenever medically stable. CSW agreeable to keep SNF updated on DC plans.

## 2014-07-27 NOTE — Progress Notes (Signed)
PT Cancellation Note  Patient Details Name: [name] [name] MRN: [id number] DOB: 09-29-1940   Cancelled Treatment:    Reason Eval/Treat Not Completed: Patient not medically ready-Spoke with RN who recommended PT hold eval on today and check back another day.    [name] [name], MPT Pager: [phone]

## 2014-07-27 NOTE — Progress Notes (Signed)
Patient had a 7 beat run of vtach and went back into NSR. Asymptommatic. MD notified.

## 2014-07-27 NOTE — Progress Notes (Signed)
OT Cancellation Note  Patient Details Name: [name] [name] MRN: [id number] DOB: 09-23-1940   Cancelled Treatment:    Reason Eval/Treat Not Completed: Other (comment). Noted that [facility].  Will defer OT to that venue.  [name] [name] 07/27/2014, 3:43 PM  [name] [name], OTR/L [phone] 07/27/2014

## 2014-07-27 NOTE — Progress Notes (Addendum)
[facility] PROGRESS NOTE  Assessment/Plan: SIRS: - On admission she met criteria for Sirs, her UA has 3-6 white blood cells, unclear if this is due to her urinary tract infection, urine cultures pending. - She is being covered with vancomycin and Zosyn has remained afebrile.   Acute encephalopathy: - She is in acute renal failure with hypernatremia,probably contributory to her encephalopathy. - Unsure if due to infectious etiology. - Place patient nothing by mouth, as she is at high risk of aspiration.  Hypernatremia: - Change her IV fluids to D5 half-normal saline, recheck a basic metabolic panel in the morning.  - Is likely due to hypovolemia.  Acute kidney injury: - There is likely prerenal, will go ahead and start her on IV fluids aggressively D5 half-normal saline. - Once her hypernatremia has improved can change her to have normal saline. - Baseline creatinine 1.0-1.2.  Essential hypertension: - Continue Norvasc.  Dyslipidemia: - Continue home meds.  Hypothyroidism: - TSH mildly low setting of acute stressors. - Continue Synthroid will have to repeat in 6 weeks.  Bipolar 1 disorder: - Hold lithium within normal levels could start to cannulate with new acute renal failure.    Code Status: Full Family Communication: Family not at the bedside  Disposition Plan: Continue to be inpatient.   Consultants:  none  Procedures: Ct Head Wo Contrast 07/26/2014 No acute intracranial abnormality. 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   Dg Chest Port 1 View (if Code Sepsis Called) 07/26/2014 No acute cardiopulmonary abnormality seen.  Antibiotics:  Vancomycin and Zosyn started on 07/26/2014 one dose.  Rocephin 07/27/2014  HPI/Subjective: Nonverbal.  Objective: Filed Vitals:   07/26/14 1831 07/26/14 2204 07/27/14 0015 07/27/14 0543  BP:  134/80  139/80  Pulse:  78  93    Temp:  99.2 F (37.3 C) 101 F (38.3 C) 100.3 F (37.9 C)  TempSrc:  Oral Rectal Oral  Resp:  18  20  Height: _0  (1.676 m)     Weight: 81.6 kg (179 lb 14.3 oz)   81.6 kg (179 lb 14.3 oz)  SpO2:  98%  95%    Intake/Output Summary (Last 24 hours) at 07/27/14 1010 Last data filed at 07/27/14 0700  Gross per 24 hour  Intake 636.67 ml  Output    150 ml  Net 486.67 ml   Filed Weights   07/26/14 1053 07/26/14 1831 07/27/14 0543  Weight: 76.204 kg (168 lb) 81.6 kg (179 lb 14.3 oz) 81.6 kg (179 lb 14.3 oz)    Exam:  General: Alert, awake, oriented x3, in no acute distress.  HEENT: No bruits, no goiter.  Heart: Regular rate and rhythm, Lungs: Good air movement, crackles on the right. Abdomen: Soft, nontender, nondistended, positive bowel sounds.   Data Reviewed: Basic Metabolic Panel:  Recent Labs Lab 07/26/14 1033 07/26/14 1915 07/27/14 0545  NA 157* 156* 162*  K 4.2 3.9 3.9  CL 120* 125* >130*  CO2 _1 GLUCOSE 118* 107* 95  BUN _2 CREATININE 1.75* 1.61* 1.54*  CALCIUM 11.2* 9.9 10.3  MG  --  2.3  --   PHOS  --  4.1  --    Liver Function Tests:  Recent Labs Lab 07/26/14 1033 07/26/14 1915  AST 38* 43*  ALT 38* 42*  ALKPHOS 119* 94  BILITOT 0.7 0.6  PROT 7.3 5.9*  ALBUMIN 3.9 3.1*   No results  for input(s): LIPASE, AMYLASE in the last 168 hours. No results for input(s): AMMONIA in the last 168 hours. CBC:  Recent Labs Lab 07/26/14 1033  WBC 13.2*  NEUTROABS 11.2*  HGB 14.3  HCT 48.3*  MCV 101.5*  PLT 332   Cardiac Enzymes: No results for input(s): CKTOTAL, CKMB, CKMBINDEX, TROPONINI in the last 168 hours. BNP (last 3 results) No results for input(s): BNP in the last 8760 hours.  ProBNP (last 3 results) No results for input(s): PROBNP in the last 8760 hours.  CBG:  Recent Labs Lab 07/26/14 1016 07/27/14 0802  GLUCAP 167* 91    Recent Results (from the past 240 hour(s))  Blood culture (routine x 2)     Status: None  (Preliminary result)   Collection Time: 07/26/14 10:33 AM  Result Value Ref Range Status   Specimen Description BLOOD LEFT HAND  Final   Special Requests BOTTLES DRAWN AEROBIC AND ANAEROBIC 5 CC EACH  Final   Culture   Final           BLOOD CULTURE RECEIVED NO GROWTH TO DATE CULTURE WILL BE HELD FOR 5 DAYS BEFORE ISSUING A FINAL NEGATIVE REPORT Performed at [facility]    Report Status PENDING  Incomplete  Blood culture (routine x 2)     Status: None (Preliminary result)   Collection Time: 07/26/14 10:39 AM  Result Value Ref Range Status   Specimen Description BLOOD LEFT WRIST  Final   Special Requests BOTTLES DRAWN AEROBIC AND ANAEROBIC 3 CC EA  Final   Culture   Final           BLOOD CULTURE RECEIVED NO GROWTH TO DATE CULTURE WILL BE HELD FOR 5 DAYS BEFORE ISSUING A FINAL NEGATIVE REPORT Performed at [facility]    Report Status PENDING  Incomplete     Studies: Ct Head Wo Contrast  07/26/2014   CLINICAL DATA:  74 year old female with altered mental status. Loss of speech. Initial encounter.  EXAM: CT HEAD WITHOUT CONTRAST  TECHNIQUE: Contiguous axial images were obtained from the base of the skull through the vertex without intravenous contrast.  COMPARISON:  Brain MRI 06/18/2014 and earlier.  FINDINGS: No acute osseous abnormality identified. Visualized paranasal sinuses and mastoids are clear. Negative orbit and scalp soft tissues.  Mild Calcified atherosclerosis at the skull base. No ventriculomegaly. No midline shift, mass effect, or evidence of intracranial mass lesion. Stable mild to moderate for age deep gray matter nuclei hypodensity. Stable mild for age white matter hypodensity elsewhere. No evidence of cortically based acute infarction identified. No acute intracranial hemorrhage identified. No suspicious intracranial vascular hyperdensity.  IMPRESSION: No acute intracranial abnormality.  Stable non contrast CT appearance of the brain with evidence of deep gray  matter chronic small vessel disease.   Electronically Signed   By: Genevie Ann M.D.   On: 07/26/2014 14:55   Dg Chest Port 1 View  (if Code Sepsis Called)  07/26/2014   CLINICAL DATA:  Altered mental status.  EXAM: PORTABLE CHEST - 1 VIEW  COMPARISON:  June 24, 2014.  FINDINGS: The heart size and mediastinal contours are within normal limits. Both lungs are clear. No pneumothorax or pleural effusion is noted. The visualized skeletal structures are unremarkable.  IMPRESSION: No acute cardiopulmonary abnormality seen.   Electronically Signed   By: Marijo Conception, M.D.   On: 07/26/2014 11:25    Scheduled Meds: . amLODipine  5 mg Oral Q breakfast  . aspirin  81 mg  Oral Daily  . B-complex with vitamin C  1 tablet Oral Q breakfast  . benztropine  1 mg Oral Q breakfast  . carbamazepine  200 mg Oral BID  . cholecalciferol  1,000 Units Oral Q breakfast  . ferrous sulfate  325 mg Oral Q breakfast  . haloperidol  2 mg Oral BID  . lactobacillus acidophilus & bulgar  1 tablet Oral BID  . levothyroxine  75 mcg Intravenous Daily  . lithium carbonate  300 mg Oral Q breakfast  . lithium carbonate  600 mg Oral Q supper  . piperacillin-tazobactam (ZOSYN)  IV  3.375 g Intravenous 3 times per day  . senna  1 tablet Oral BID  . simvastatin  20 mg Oral QHS  . sodium chloride  3 mL Intravenous Q12H  . temazepam  7.5 mg Oral QHS  . vancomycin  750 mg Intravenous Q24H   Continuous Infusions: . dextrose       Charlynne Cousins  [facility] Pager [phone]. If 7PM-7AM, please contact night-coverage at [email or web address], password [facility] 07/27/2014, 10:10 AM  LOS: 1 day

## 2014-07-27 NOTE — Progress Notes (Signed)
CRITICAL VALUE ALERT  Critical value received:  Positive MRSA swab  Date of notification:  07/27/14  Time of notification:  1339  Critical value read back:Yes.    Nurse who received alert:  Bernette RedbirdLindsay Zareya Tuckett   MD notified (1st page):  *David StallFeliz Ortiz  Time of first page:  1339  MD notified (2nd page):  Time of second page:  Responding MD:  David StallFeliz Ortiz  Time MD responded:  1340

## 2014-07-27 NOTE — Progress Notes (Signed)
Pt's contact, Susy FrizzleMatt, is requesting an update from MD. He can be reached at [phone]. Dr. David StallFeliz Ortiz notified.  He is in agreement with patient being NPO and verbalized that patient has aspirated twice in the past and has been on thickened liquids at the nursing home.

## 2014-07-27 NOTE — Progress Notes (Signed)
INITIAL NUTRITION ASSESSMENT  DOCUMENTATION CODES Per approved criteria  -Not Applicable   INTERVENTION: - Diet advancement as medically tolerated - Recommend SLP evaluation once pt alert. - RD will continue to monitor for diet advancement  NUTRITION DIAGNOSIS: Inadequate oral intake related to inability to eat as evidenced by NPO.   Goal: Pt to meet >/= 90% of their estimated nutrition needs   Monitor:  Weight trend, po intake, acceptance of supplements, labs  Reason for Assessment: Low Braden Score  74 y.o. female  Admitting Dx: Acute encephalopathy  ASSESSMENT: 74 year old female with history of bipolar disorder, hypertension, dyslipidemia, hypothyroidism, CKD stage 3, from nursing home who [facility] for evaluation of altered mental status.  - Pt not alert during RD visit.  - Spoke with RN who said that pt is NPO due to high risk for aspiration. RN spoke with pt's family who reported that pt is on thickened liquids at SNF due to PMH of aspiration. - Recommend SLP evaluation once pt alert - RD will continue to monitor - Labs reviewed - No fat or muscle wasting  Height: Ht Readings from Last 1 Encounters:  07/26/14 5\' 6"  (1.676 m)    Weight: Wt Readings from Last 1 Encounters:  07/27/14 179 lb 14.3 oz (81.6 kg)    Ideal Body Weight: 59.3 kg  % Ideal Body Weight: 138%  Wt Readings from Last 10 Encounters:  07/27/14 179 lb 14.3 oz (81.6 kg)  06/23/14 167 lb 12.3 oz (76.1 kg)  04/26/14 171 lb 4.8 oz (77.7 kg)   BMI:  Body mass index is 29.05 kg/(m^2).  Estimated Nutritional Needs: Kcal: 2000-2200 Protein: 110-120 g Fluid: 2.0 L/day  Skin: intact  Diet Order: Diet NPO time specified  EDUCATION NEEDS: -Education needs addressed   Intake/Output Summary (Last 24 hours) at 07/27/14 1240 Last data filed at 07/27/14 1052  Gross per 24 hour  Intake 636.67 ml  Output      0 ml  Net 636.67 ml    Last BM: 2/22   Labs:   Recent Labs Lab  07/26/14 1033 07/26/14 1915 07/27/14 0545  NA 157* 156* 162*  K 4.2 3.9 3.9  CL 120* 125* >130*  CO2 28 26 26   BUN 19 19 20   CREATININE 1.75* 1.61* 1.54*  CALCIUM 11.2* 9.9 10.3  MG  --  2.3  --   PHOS  --  4.1  --   GLUCOSE 118* 107* 95    CBG (last 3)   Recent Labs  07/26/14 1016 07/27/14 0802  GLUCAP 167* 91    Scheduled Meds: . amLODipine  5 mg Oral Q breakfast  . aspirin  81 mg Oral Daily  . B-complex with vitamin C  1 tablet Oral Q breakfast  . benztropine  1 mg Oral Q breakfast  . carbamazepine  200 mg Oral BID  . cefTRIAXone (ROCEPHIN)  IV  1 g Intravenous Q24H  . cholecalciferol  1,000 Units Oral Q breakfast  . ferrous sulfate  325 mg Oral Q breakfast  . haloperidol  2 mg Oral BID  . lactobacillus acidophilus & bulgar  1 tablet Oral BID  . levothyroxine  75 mcg Intravenous Daily  . lithium carbonate  300 mg Oral Q breakfast  . lithium carbonate  600 mg Oral Q supper  . senna  1 tablet Oral BID  . simvastatin  20 mg Oral QHS  . sodium chloride  3 mL Intravenous Q12H  . temazepam  7.5 mg  Oral QHS    Continuous Infusions: . dextrose 5 % and 0.45% NaCl 75 mL/hr at 07/27/14 1134    Past Medical History  Diagnosis Date  . Hypertension   . Bipolar 1 disorder   . Hypothyroidism   . Memory difficulties   . HLD (hyperlipidemia)   . Diverticulitis large intestine   . Pneumonia     Past Surgical History  Procedure Laterality Date  . No past surgeries      Emmaline Kluver MS, RD, LDN [phone]

## 2014-07-27 NOTE — Progress Notes (Signed)
UR complete 

## 2014-07-28 ENCOUNTER — Inpatient Hospital Stay (HOSPITAL_COMMUNITY): Payer: Medicare Other

## 2014-07-28 DIAGNOSIS — D72829 Elevated white blood cell count, unspecified: Secondary | ICD-10-CM

## 2014-07-28 DIAGNOSIS — E860 Dehydration: Secondary | ICD-10-CM

## 2014-07-28 DIAGNOSIS — N179 Acute kidney failure, unspecified: Secondary | ICD-10-CM | POA: Diagnosis present

## 2014-07-28 DIAGNOSIS — E039 Hypothyroidism, unspecified: Secondary | ICD-10-CM

## 2014-07-28 LAB — COMPREHENSIVE METABOLIC PANEL
ALK PHOS: 101 U/L (ref 39–117)
ALT: 34 U/L (ref 0–35)
AST: 29 U/L (ref 0–37)
Albumin: 3.6 g/dL (ref 3.5–5.2)
BUN: 30 mg/dL — ABNORMAL HIGH (ref 6–23)
CALCIUM: 10.9 mg/dL — AB (ref 8.4–10.5)
CO2: 24 mmol/L (ref 19–32)
Chloride: >130 mmol/L (ref 96–112)
Creatinine, Ser: 2.02 mg/dL — ABNORMAL HIGH (ref 0.50–1.10)
GFR, EST AFRICAN AMERICAN: 27 mL/min — AB (ref >90)
GFR, EST NON AFRICAN AMERICAN: 23 mL/min — AB (ref >90)
GLUCOSE: 160 mg/dL — AB (ref 70–99)
POTASSIUM: 4.1 mmol/L (ref 3.5–5.1)
SODIUM: 167 mmol/L — AB (ref 135–145)
Total Bilirubin: 0.2 mg/dL — ABNORMAL LOW (ref 0.3–1.2)
Total Protein: 7.1 g/dL (ref 6.0–8.3)

## 2014-07-28 LAB — PROCALCITONIN: Procalcitonin: 0.13 ng/mL

## 2014-07-28 LAB — URINALYSIS, ROUTINE W REFLEX MICROSCOPIC
BILIRUBIN URINE: NEGATIVE
Glucose, UA: NEGATIVE mg/dL
HGB URINE DIPSTICK: NEGATIVE
Ketones, ur: NEGATIVE mg/dL
Leukocytes, UA: NEGATIVE
Nitrite: NEGATIVE
PROTEIN: NEGATIVE mg/dL
Specific Gravity, Urine: 1.012 (ref 1.005–1.030)
UROBILINOGEN UA: 0.2 mg/dL (ref 0.0–1.0)
pH: 6 (ref 5.0–8.0)

## 2014-07-28 LAB — CBC WITH DIFFERENTIAL/PLATELET
BASOS PCT: 0 % (ref 0–1)
Basophils Absolute: 0.1 10*3/uL (ref 0.0–0.1)
EOS PCT: 0 % (ref 0–5)
Eosinophils Absolute: 0.1 10*3/uL (ref 0.0–0.7)
HCT: 50.5 % — ABNORMAL HIGH (ref 36.0–46.0)
Hemoglobin: 14.3 g/dL (ref 12.0–15.0)
Lymphocytes Relative: 7 % — ABNORMAL LOW (ref 12–46)
Lymphs Abs: 1.2 10*3/uL (ref 0.7–4.0)
MCH: 30.1 pg (ref 26.0–34.0)
MCHC: 28.3 g/dL — AB (ref 30.0–36.0)
MCV: 106.3 fL — ABNORMAL HIGH (ref 78.0–100.0)
MONO ABS: 1.3 10*3/uL — AB (ref 0.1–1.0)
MONOS PCT: 8 % (ref 3–12)
NEUTROS PCT: 85 % — AB (ref 43–77)
Neutro Abs: 14.6 10*3/uL — ABNORMAL HIGH (ref 1.7–7.7)
PLATELETS: 212 10*3/uL (ref 150–400)
RBC: 4.75 MIL/uL (ref 3.87–5.11)
RDW: 13.1 % (ref 11.5–15.5)
WBC: 17.2 10*3/uL — ABNORMAL HIGH (ref 4.0–10.5)

## 2014-07-28 LAB — MAGNESIUM: MAGNESIUM: 2.7 mg/dL — AB (ref 1.5–2.5)

## 2014-07-28 LAB — GLUCOSE, CAPILLARY: GLUCOSE-CAPILLARY: 146 mg/dL — AB (ref 70–99)

## 2014-07-28 MED ORDER — SODIUM CHLORIDE 0.9 % IV BOLUS (SEPSIS)
1000.0000 mL | Freq: Once | INTRAVENOUS | Status: AC
  Administered 2014-07-28: 1000 mL via INTRAVENOUS

## 2014-07-28 MED ORDER — ASPIRIN 300 MG RE SUPP
300.0000 mg | Freq: Every day | RECTAL | Status: DC
  Administered 2014-07-28 – 2014-08-01 (×5): 300 mg via RECTAL
  Filled 2014-07-28 (×6): qty 1

## 2014-07-28 MED ORDER — LEVOTHYROXINE SODIUM 100 MCG IV SOLR
68.0000 ug | Freq: Every day | INTRAVENOUS | Status: DC
  Administered 2014-07-28 – 2014-08-01 (×5): 68 ug via INTRAVENOUS
  Filled 2014-07-28 (×6): qty 5

## 2014-07-28 MED ORDER — DEXTROSE 5 % IV SOLN
INTRAVENOUS | Status: DC
  Administered 2014-07-28 – 2014-07-29 (×2): via INTRAVENOUS

## 2014-07-28 MED ORDER — BENZTROPINE MESYLATE 1 MG PO TABS
1.0000 mg | ORAL_TABLET | Freq: Every day | ORAL | Status: DC
  Filled 2014-07-28 (×2): qty 1

## 2014-07-28 MED ORDER — BENZTROPINE MESYLATE 1 MG/ML IJ SOLN
1.0000 mg | Freq: Every day | INTRAMUSCULAR | Status: DC
  Administered 2014-07-28 – 2014-07-29 (×2): 1 mg via INTRAMUSCULAR
  Filled 2014-07-28 (×2): qty 1

## 2014-07-28 MED ORDER — DEXTROSE 5 % IV SOLN
INTRAVENOUS | Status: DC
  Filled 2014-07-28: qty 1000

## 2014-07-28 NOTE — Progress Notes (Signed)
[facility] PROGRESS NOTE  [name] [name] ONG:[id number] DOB: Mar 22, 1941 DOA: 07/26/2014 PCP: Astrid Divine, MD  Assessment/Plan: #1 acute encephalopathy Likely secondary to hypernatremia in the setting of acute renal failure. Doubt if infectious etiology. Chest x-ray was negative. CT head negative. Urinalysis was nitrite negative leukocytes negative. Urine cultures have been negative. Blood cultures pending with no growth to date. Patient's sodium level this morning is 167. Change IV fluids to D5W. Give a bolus of IV fluids. DC IV Rocephin. Serial basic metabolic profiles. Follow.  #2 hypernatremia Likely secondary to dehydration. Will change IV fluids to D5 W. Serial BMETs.  #3 acute renal failure Likely prerenal azotemia as patient does look dehydrated and noted to be hyponatremic. Baseline creatinine 1-1.2. Renal ultrasound negative for hydronephrosis. Increase IV fluids. Follow.  #4 leukocytosis Questionable etiology. Patient is currently afebrile. Patient had been on IV Vanco and Zosyn and currently on IV Rocephin. No source of infection noted. Urine cultures are negative. Blood cultures pending today. We'll discontinue antibiotics and follow.  #5 hypertension Stable. Patient not taking oral medications at this point in time. We'll follow for now.  #6 dyslipidemia When tolerating oral intake will resume statin.  #7 hypothyroidism TSH was decreased. Decrease Synthroid. Will need repeat thyroid function studies done in about 4-6 weeks.  #8 bipolar 1 disorder Will continue to hold lithium for now as patient is acute renal failure and lithium may accumulate. Follow for now.   Code Status: Full Family Communication: No family at bedside. Disposition Plan: Remain inpatient.   Consultants:  None  Procedures:  CT head 07/26/2014  Chest x-ray 07/26/2014  Renal ultrasound 07/28/2014  Antibiotics:  IV Zosyn 07/26/2014>>> 07/27/2014  IV vancomycin to 22  2016>>> 07/27/2014  IV Rocephin 07/27/2014>>>> 07/28/2014  HPI/Subjective: Patient opens eyes to verbal stimuli and is tracking however not following commands.  Objective: Filed Vitals:   07/28/14 1446  BP: 150/91  Pulse: 97  Temp: 98.8 F (37.1 C)  Resp: 20    Intake/Output Summary (Last 24 hours) at 07/28/14 1519 Last data filed at 07/28/14 1230  Gross per 24 hour  Intake 981.25 ml  Output      5 ml  Net 976.25 ml   Filed Weights   07/26/14 1053 07/26/14 1831 07/27/14 0543  Weight: 76.204 kg (168 lb) 81.6 kg (179 lb 14.3 oz) 81.6 kg (179 lb 14.3 oz)    Exam:   General:  NAD. Patient is tracking. Open his eyes to verbal stimuli. Not following commands.  Cardiovascular: Regular rate rhythm no murmurs rubs or gallops.  Respiratory: Clear to auscultation bilaterally anterior lung fields.  Abdomen: Obese, soft, nontender, nondistended, positive bowel sounds.  Musculoskeletal: No clubbing cyanosis or edema.  Data Reviewed: Basic Metabolic Panel:  Recent Labs Lab 07/26/14 1033 07/26/14 1915 07/27/14 0545 07/28/14 0550  NA 157* 156* 162* 167*  K 4.2 3.9 3.9 4.1  CL 120* 125* >130* >130*  CO2 GLUCOSE 118* 107* 95 160*  BUN 30*  CREATININE 1.75* 1.61* 1.54* 2.02*  CALCIUM 11.2* 9.9 10.3 10.9*  MG  --  2.3  --  2.7*  PHOS  --  4.1  --   --    Liver Function Tests:  Recent Labs Lab 07/26/14 1033 07/26/14 1915 07/28/14 0550  AST 38* 43* 29  ALT 38* 42* 34  ALKPHOS 119* 94 101  BILITOT 0.7 0.6 0.2*  PROT 7.3 5.9* 7.1  ALBUMIN 3.9 3.1* 3.6   No results  for input(s): LIPASE, AMYLASE in the last 168 hours. No results for input(s): AMMONIA in the last 168 hours. CBC:  Recent Labs Lab 07/26/14 1033 07/28/14 0550  WBC 13.2* 17.2*  NEUTROABS 11.2* 14.6*  HGB 14.3 14.3  HCT 48.3* 50.5*  MCV 101.5* 106.3*  PLT 332 212   Cardiac Enzymes: No results for input(s): CKTOTAL, CKMB, CKMBINDEX, TROPONINI in the last 168 hours. BNP  (last 3 results) No results for input(s): BNP in the last 8760 hours.  ProBNP (last 3 results) No results for input(s): PROBNP in the last 8760 hours.  CBG:  Recent Labs Lab 07/26/14 1016 07/27/14 0802 07/28/14 0745  GLUCAP 167* 91 146*    Recent Results (from the past 240 hour(s))  Blood culture (routine x 2)     Status: None (Preliminary result)   Collection Time: 07/26/14 10:33 AM  Result Value Ref Range Status   Specimen Description BLOOD LEFT HAND  Final   Special Requests BOTTLES DRAWN AEROBIC AND ANAEROBIC 5 CC EACH  Final   Culture   Final           BLOOD CULTURE RECEIVED NO GROWTH TO DATE CULTURE WILL BE HELD FOR 5 DAYS BEFORE ISSUING A FINAL NEGATIVE REPORT Performed at [facility]    Report Status PENDING  Incomplete  Blood culture (routine x 2)     Status: None (Preliminary result)   Collection Time: 07/26/14 10:39 AM  Result Value Ref Range Status   Specimen Description BLOOD LEFT WRIST  Final   Special Requests BOTTLES DRAWN AEROBIC AND ANAEROBIC 3 CC EA  Final   Culture   Final           BLOOD CULTURE RECEIVED NO GROWTH TO DATE CULTURE WILL BE HELD FOR 5 DAYS BEFORE ISSUING A FINAL NEGATIVE REPORT Performed at [facility]    Report Status PENDING  Incomplete  Urine culture     Status: None   Collection Time: 07/26/14 10:53 AM  Result Value Ref Range Status   Specimen Description URINE, CATHETERIZED  Final   Special Requests NONE  Final   Colony Count NO GROWTH Performed at [facility]   Final   Culture NO GROWTH Performed at [facility]   Final   Report Status 07/27/2014 FINAL  Final  MRSA PCR Screening     Status: Abnormal   Collection Time: 07/27/14 10:31 AM  Result Value Ref Range Status   MRSA by PCR POSITIVE (A) NEGATIVE Final    Comment:        The GeneXpert MRSA Assay (FDA approved for NASAL specimens only), is one component of a comprehensive MRSA colonization surveillance program. It is not intended  to diagnose MRSA infection nor to guide or monitor treatment for MRSA infections. RESULT CALLED TO, READ BACK BY AND VERIFIED WITH: Ramond Craver [id number] @ 1335 BY J SCOTTON      Studies: No results found.  Scheduled Meds: . aspirin  300 mg Rectal Daily  . B-complex with vitamin C  1 tablet Oral Q breakfast  . benztropine  1 mg Oral Q breakfast  . carbamazepine  200 mg Oral BID  . cefTRIAXone (ROCEPHIN)  IV  1 g Intravenous Q24H  . Chlorhexidine Gluconate Cloth  6 each Topical Q0600  . haloperidol  2 mg Oral BID  . lactobacillus acidophilus & bulgar  1 tablet Oral BID  . levothyroxine  68 mcg Intravenous Daily  . mupirocin ointment  1 application Nasal BID  .  sodium chloride  3 mL Intravenous Q12H   Continuous Infusions: . dextrose 125 mL/hr at 07/28/14 1127    Principal Problem:   Acute encephalopathy Active Problems:   Bipolar 1 disorder   Acute kidney injury   Hypernatremia   Dehydration   SIRS (systemic inflammatory response syndrome)   Essential hypertension   Dyslipidemia   Hypothyroidism    Time spent: 35 minutes    Trinnity Breunig M.D. [facility] Pager [phone]. If 7PM-7AM, please contact night-coverage at [email or web address], password [email or web address] 07/28/2014, 3:19 PM  LOS: 2 days

## 2014-07-28 NOTE — Progress Notes (Signed)
PT Cancellation Note  Patient Details Name: [name] MRN: [id number] DOB: 02-04-1941   Cancelled Treatment:    Reason Eval/Treat Not Completed: Medical issues which prohibited therapy (pt not responsive to verbal/physical stimuli, not able to participate in PT. Will follow. )   Tamala SerUhlenberg, Swati Granberry Kistler 07/28/2014, 1:57 PM  [phone]

## 2014-07-28 NOTE — Progress Notes (Signed)
CRITICAL VALUE ALERT  Critical value received:  Sodium 167, Chloride > 130  Date of notification:  07/28/14   Time of notification:  0935  Critical value read back:Yes.    Nurse who received alert:  Bernette RedbirdLindsay Askia Hazelip  MD notified (1st page):  Janee Mornhompson Time of first page:  0935  MD notified (2nd page):  Time of second page:  Responding MD:  Janee Mornhompson  Time MD responded:  [phone]

## 2014-07-28 NOTE — Evaluation (Signed)
SLP Cancellation Note  Patient Details Name: [name] [name] MRN: [id number] DOB: 11/02/1940   Cancelled treatment:       Reason Eval/Treat Not Completed: Fatigue/lethargy limiting ability to participate   Cleto Claggett, MS CCC SLP [phone]    

## 2014-07-29 ENCOUNTER — Other Ambulatory Visit (HOSPITAL_COMMUNITY): Payer: Self-pay

## 2014-07-29 DIAGNOSIS — F319 Bipolar disorder, unspecified: Secondary | ICD-10-CM

## 2014-07-29 LAB — URINE CULTURE
COLONY COUNT: NO GROWTH
CULTURE: NO GROWTH

## 2014-07-29 LAB — BASIC METABOLIC PANEL
ANION GAP: 6 (ref 5–15)
Anion gap: 2 — ABNORMAL LOW (ref 5–15)
BUN: 31 mg/dL — ABNORMAL HIGH (ref 6–23)
BUN: 32 mg/dL — ABNORMAL HIGH (ref 6–23)
BUN: 30 mg/dL — AB (ref 6–23)
BUN: 26 mg/dL — ABNORMAL HIGH (ref 6–23)
CALCIUM: 10 mg/dL (ref 8.4–10.5)
CHLORIDE: 123 mmol/L — AB (ref 96–112)
CO2: 25 mmol/L (ref 19–32)
CO2: 24 mmol/L (ref 19–32)
CO2: 22 mmol/L (ref 19–32)
CO2: 22 mmol/L (ref 19–32)
CREATININE: 1.78 mg/dL — AB (ref 0.50–1.10)
Calcium: 10.1 mg/dL (ref 8.4–10.5)
Calcium: 10.3 mg/dL (ref 8.4–10.5)
Calcium: 9.7 mg/dL (ref 8.4–10.5)
Chloride: >130 mmol/L (ref 96–112)
Chloride: 129 mmol/L — ABNORMAL HIGH (ref 96–112)
Creatinine, Ser: 1.61 mg/dL — ABNORMAL HIGH (ref 0.50–1.10)
Creatinine, Ser: 1.49 mg/dL — ABNORMAL HIGH (ref 0.50–1.10)
Creatinine, Ser: 1.35 mg/dL — ABNORMAL HIGH (ref 0.50–1.10)
GFR calc Af Amer: 31 mL/min — ABNORMAL LOW (ref >90)
GFR calc Af Amer: 39 mL/min — ABNORMAL LOW (ref >90)
GFR calc Af Amer: 44 mL/min — ABNORMAL LOW (ref >90)
GFR calc non Af Amer: 27 mL/min — ABNORMAL LOW (ref >90)
GFR calc non Af Amer: 33 mL/min — ABNORMAL LOW (ref >90)
GFR calc non Af Amer: 38 mL/min — ABNORMAL LOW (ref >90)
GFR, EST AFRICAN AMERICAN: 35 mL/min — AB (ref >90)
GFR, EST NON AFRICAN AMERICAN: 30 mL/min — AB (ref >90)
GLUCOSE: 152 mg/dL — AB (ref 70–99)
GLUCOSE: 125 mg/dL — AB (ref 70–99)
Glucose, Bld: 143 mg/dL — ABNORMAL HIGH (ref 70–99)
Glucose, Bld: 128 mg/dL — ABNORMAL HIGH (ref 70–99)
POTASSIUM: 3.5 mmol/L (ref 3.5–5.1)
POTASSIUM: 4 mmol/L (ref 3.5–5.1)
Potassium: 3.2 mmol/L — ABNORMAL LOW (ref 3.5–5.1)
Potassium: 3.9 mmol/L (ref 3.5–5.1)
SODIUM: 163 mmol/L — AB (ref 135–145)
SODIUM: 151 mmol/L — AB (ref 135–145)
Sodium: 165 mmol/L (ref 135–145)
Sodium: 155 mmol/L — ABNORMAL HIGH (ref 135–145)

## 2014-07-29 LAB — CBC WITH DIFFERENTIAL/PLATELET
BASOS ABS: 0 10*3/uL (ref 0.0–0.1)
Basophils Relative: 0 % (ref 0–1)
EOS PCT: 4 % (ref 0–5)
Eosinophils Absolute: 0.4 10*3/uL (ref 0.0–0.7)
HCT: 43.5 % (ref 36.0–46.0)
HEMOGLOBIN: 12.3 g/dL (ref 12.0–15.0)
Lymphocytes Relative: 12 % (ref 12–46)
Lymphs Abs: 1.2 10*3/uL (ref 0.7–4.0)
MCH: 29.9 pg (ref 26.0–34.0)
MCHC: 28.3 g/dL — ABNORMAL LOW (ref 30.0–36.0)
MCV: 105.8 fL — ABNORMAL HIGH (ref 78.0–100.0)
Monocytes Absolute: 0.6 10*3/uL (ref 0.1–1.0)
Monocytes Relative: 6 % (ref 3–12)
NEUTROS PCT: 78 % — AB (ref 43–77)
Neutro Abs: 7.8 10*3/uL — ABNORMAL HIGH (ref 1.7–7.7)
Platelets: 154 10*3/uL (ref 150–400)
RBC: 4.11 MIL/uL (ref 3.87–5.11)
RDW: 13.2 % (ref 11.5–15.5)
WBC: 10 10*3/uL (ref 4.0–10.5)

## 2014-07-29 LAB — GLUCOSE, CAPILLARY: Glucose-Capillary: 113 mg/dL — ABNORMAL HIGH (ref 70–99)

## 2014-07-29 LAB — POTASSIUM: POTASSIUM: 4.1 mmol/L (ref 3.5–5.1)

## 2014-07-29 LAB — CARBAMAZEPINE LEVEL, TOTAL: Carbamazepine Lvl: <2 ug/mL — ABNORMAL LOW (ref 4.0–12.0)

## 2014-07-29 LAB — MAGNESIUM: Magnesium: 2.3 mg/dL (ref 1.5–2.5)

## 2014-07-29 MED ORDER — POTASSIUM CHLORIDE CRYS ER 20 MEQ PO TBCR: 20.0000 meq | EXTENDED_RELEASE_TABLET | Freq: Once | ORAL | Status: DC

## 2014-07-29 MED ORDER — DEXTROSE 50 % IV SOLN
1.0000 | Freq: Once | INTRAVENOUS | Status: DC
  Filled 2014-07-29: qty 50

## 2014-07-29 MED ORDER — POTASSIUM CHLORIDE 10 MEQ/100ML IV SOLN
10.0000 meq | INTRAVENOUS | Status: AC
  Administered 2014-07-29 (×3): 10 meq via INTRAVENOUS
  Filled 2014-07-29 (×5): qty 100

## 2014-07-29 MED ORDER — INSULIN ASPART 100 UNIT/ML ~~LOC~~ SOLN: 10.0000 [IU] | Freq: Once | SUBCUTANEOUS | Status: DC

## 2014-07-29 MED ORDER — POTASSIUM CHLORIDE 10 MEQ/100ML IV SOLN
10.0000 meq | INTRAVENOUS | Status: AC
  Administered 2014-07-29 (×2): 10 meq via INTRAVENOUS
  Filled 2014-07-29 (×2): qty 100

## 2014-07-29 MED ORDER — DEXTROSE 5 % IV SOLN
INTRAVENOUS | Status: DC
  Administered 2014-07-29: 10:00:00 via INTRAVENOUS
  Filled 2014-07-29 (×3): qty 1000

## 2014-07-29 MED ORDER — DEXTROSE 5 % IV BOLUS
1000.0000 mL | Freq: Once | INTRAVENOUS | Status: AC
  Administered 2014-07-29: 1000 mL via INTRAVENOUS

## 2014-07-29 MED ORDER — DEXTROSE 5 % IV SOLN
INTRAVENOUS | Status: DC
  Administered 2014-07-29: 18:00:00 via INTRAVENOUS
  Filled 2014-07-29 (×11): qty 1000

## 2014-07-29 MED ORDER — SODIUM CHLORIDE 0.9 % IV BOLUS (SEPSIS)
500.0000 mL | Freq: Once | INTRAVENOUS | Status: DC
  Administered 2014-07-29: 500 mL via INTRAVENOUS

## 2014-07-29 MED ORDER — INSULIN ASPART 100 UNIT/ML IV SOLN: 10.0000 [IU] | Freq: Once | INTRAVENOUS | Status: DC

## 2014-07-29 NOTE — Progress Notes (Addendum)
CRITICAL VALUE ALERT  Critical value received:  Sodium and Chloride  Date of notification:  2/25  Time of notification:  1200 Critical value read back:Yes.    Nurse who received alert:  Philomena Dohenyavid Ameshia Pewitt  MD notified (1st page):  Janee Mornhompson  Time of first page:  1205   Responding MD:  Janee Mornhompson  Time MD responded:  [phone]

## 2014-07-29 NOTE — Evaluation (Signed)
SLP Cancellation Note  Patient Details Name: [name] [name] MRN: [id number] DOB: 01/06/1941   Cancelled treatment:       Reason Eval/Treat Not Completed: Fatigue/lethargy limiting ability to participate   Zay Yeargan, MS CCC SLP [phone]    

## 2014-07-29 NOTE — Progress Notes (Signed)
CRITICAL VALUE ALERT  Critical value received:  Potassium 6.1  Date of notification:  2/25  Time of notification:  1650  Critical value read back yes  Nurse who received alert:  Philomena Dohenyavid Favian Kittleson RN  Doctor on floor notified of results, with plan to recheck and verify potassium level, prior to starting treatment for hyperkalemia.

## 2014-07-29 NOTE — Progress Notes (Signed)
[facility] PROGRESS NOTE  [name] [name] NFA:[id number] DOB: [birth year] DOA: 07/26/2014 PCP: Astrid Divine, MD  Assessment/Plan: #1 acute encephalopathy Likely secondary to hypernatremia in the setting of acute renal failure. Doubt if infectious etiology. Chest x-ray was negative. CT head negative. Urinalysis was nitrite negative leukocytes negative. Urine cultures have been negative. Blood cultures pending with no growth to date. Patient's sodium level this morning is 155 from 167. Continue D5W. Give a bolus of IV fluids. Serial basic metabolic profiles. Follow.  #2 hypernatremia Likely secondary to dehydration. Continue D5W. Will give a bolus of IV fluids. Serial BMETs.  #3 acute renal failure Likely prerenal azotemia as patient does look dehydrated and noted to be hypernatremic. Baseline creatinine 1-1.2. Renal ultrasound negative for hydronephrosis. Continue IV fluids. Follow.  #4 leukocytosis Questionable etiology. Patient is currently afebrile. Patient had been on IV Vanco and Zosyn and currently on IV Rocephin. No source of infection noted. Urine cultures are negative. Blood cultures pending today. Antibiotic have been discontinued. Follow.   #5 hypertension Stable. Patient not taking oral medications at this point in time. Will follow for now.  #6 dyslipidemia When tolerating oral intake will resume statin.  #7 hypothyroidism TSH was decreased. Decreased Synthroid. Will need repeat thyroid function studies done in about 4-6 weeks.  #8 bipolar 1 disorder Will continue to hold lithium and Tegretol and Cogentin   for now as patient is acute renal failure and lithium may accumulate. Follow for now.Will asked psych to assess for medication management.   Code Status: Full Family Communication: No family at bedside. Disposition Plan: Remain inpatient.   Consultants:  None  Procedures:  CT head 07/26/2014  Chest x-ray 07/26/2014  Renal ultrasound  07/28/2014  Antibiotics:  IV Zosyn 07/26/2014>>> 07/27/2014  IV vancomycin to 22 2016>>> 07/27/2014  IV Rocephin 07/27/2014>>>> 07/28/2014  HPI/Subjective: Patient opens eyes to verbal stimuli and is tracking however not following commands.  Objective: Filed Vitals:   07/29/14 0522  BP: 140/82  Pulse: 88  Temp: 98.3 F (36.8 C)  Resp: 20    Intake/Output Summary (Last 24 hours) at 07/29/14 1149 Last data filed at 07/29/14 1124  Gross per 24 hour  Intake 2318.75 ml  Output    775 ml  Net 1543.75 ml   Filed Weights   07/26/14 1831 07/27/14 0543 07/29/14 0531  Weight: 81.6 kg (179 lb 14.3 oz) 81.6 kg (179 lb 14.3 oz) 62.9 kg (138 lb 10.7 oz)    Exam:   General:  NAD. Patient is tracking. Open his eyes to verbal stimuli. Not following commands.  Cardiovascular: Regular rate rhythm no murmurs rubs or gallops.  Respiratory: Clear to auscultation bilaterally anterior lung fields.  Abdomen: Obese, soft, nontender, nondistended, positive bowel sounds.  Musculoskeletal: No clubbing cyanosis or edema.  Data Reviewed: Basic Metabolic Panel:  Recent Labs Lab 07/26/14 1915 07/27/14 0545 07/28/14 0550 07/28/14 2235 07/29/14 0505 07/29/14 0900  NA 156* 162* 167* 165* 155*  --   K 3.9 3.9 4.1 3.5 3.2*  --   CL 125* >130* >130* >130* 129*  --   CO2 --   GLUCOSE 107* 95 160* 143* 152*  --   BUN 19 20 30* 31* 32*  --   CREATININE 1.61* 1.54* 2.02* 1.78* 1.61*  --   CALCIUM 9.9 10.3 10.9* 10.1 10.0  --   MG 2.3  --  2.7*  --   --  2.3  PHOS 4.1  --   --   --   --   --  Liver Function Tests:  Recent Labs Lab 07/26/14 1033 07/26/14 1915 07/28/14 0550  AST 38* 43* 29  ALT 38* 42* 34  ALKPHOS 119* 94 101  BILITOT 0.7 0.6 0.2*  PROT 7.3 5.9* 7.1  ALBUMIN 3.9 3.1* 3.6   No results for input(s): LIPASE, AMYLASE in the last 168 hours. No results for input(s): AMMONIA in the last 168 hours. CBC:  Recent Labs Lab 07/26/14 1033  07/28/14 0550 07/29/14 0505  WBC 13.2* 17.2* 10.0  NEUTROABS 11.2* 14.6* 7.8*  HGB 14.3 14.3 12.3  HCT 48.3* 50.5* 43.5  MCV 101.5* 106.3* 105.8*  PLT 332 212 154   Cardiac Enzymes: No results for input(s): CKTOTAL, CKMB, CKMBINDEX, TROPONINI in the last 168 hours. BNP (last 3 results) No results for input(s): BNP in the last 8760 hours.  ProBNP (last 3 results) No results for input(s): PROBNP in the last 8760 hours.  CBG:  Recent Labs Lab 07/26/14 1016 07/27/14 0802 07/28/14 0745 07/29/14 0738  GLUCAP 167* 91 146* 113*    Recent Results (from the past 240 hour(s))  Blood culture (routine x 2)     Status: None (Preliminary result)   Collection Time: 07/26/14 10:33 AM  Result Value Ref Range Status   Specimen Description BLOOD LEFT HAND  Final   Special Requests BOTTLES DRAWN AEROBIC AND ANAEROBIC 5 CC EACH  Final   Culture   Final           BLOOD CULTURE RECEIVED NO GROWTH TO DATE CULTURE WILL BE HELD FOR 5 DAYS BEFORE ISSUING A FINAL NEGATIVE REPORT [facility]    Report Status PENDING  Incomplete  Blood culture (routine x 2)     Status: None (Preliminary result)   Collection Time: 07/26/14 10:39 AM  Result Value Ref Range Status   Specimen Description BLOOD LEFT WRIST  Final   Special Requests BOTTLES DRAWN AEROBIC AND ANAEROBIC 3 CC EA  Final   Culture   Final           BLOOD CULTURE RECEIVED NO GROWTH TO DATE CULTURE WILL BE HELD FOR 5 DAYS BEFORE ISSUING A FINAL NEGATIVE REPORT [facility]    Report Status PENDING  Incomplete  Urine culture     Status: None   Collection Time: 07/26/14 10:53 AM  Result Value Ref Range Status   Specimen Description URINE, CATHETERIZED  Final   Special Requests NONE  Final   Colony Count NO GROWTH [facility]   Final   Culture NO GROWTH [facility]   Final   Report Status 07/27/2014 FINAL  Final  MRSA PCR Screening     Status:  Abnormal   Collection Time: 07/27/14 10:31 AM  Result Value Ref Range Status   MRSA by PCR POSITIVE (A) NEGATIVE Final    Comment:        The GeneXpert MRSA Assay (FDA approved for NASAL specimens only), is one component of a comprehensive MRSA colonization surveillance program. It is not intended to diagnose MRSA infection nor to guide or monitor treatment for MRSA infections. RESULT CALLED TO, READ BACK BY AND VERIFIED WITH: Ramond CraverLINDSEY DRAPER,RN [id number] @ 1335 BY J SCOTTON      Studies: [place] [facility]  07/28/2014   CLINICAL DATA:  Acute renal failure.  History of hypertension.  EXAM: RENAL/URINARY TRACT ULTRASOUND COMPLETE  COMPARISON:  Abdomen and pelvis CT, 06/17/2014  FINDINGS: Right Kidney:  Length: 8.2 cm. Borderline increased parenchymal echogenicity.  Mild cortical thinning mostly noted from the upper pole. No renal mass or stone. No hydronephrosis.  Left Kidney:  Length: 10 cm. Echogenicity within normal limits. No mass or hydronephrosis visualized.  Bladder:  Decompressed by a Foley catheter.  IMPRESSION: 1. No acute findings.  No hydronephrosis. 2. Mild reduction in the size of the right kidney with mild cortical thinning and borderline increased parenchymal echogenicity, consistent with medical renal disease.   Electronically Signed   By: Amie Portland M.D.   On: 07/28/2014 16:29    Scheduled Meds: . aspirin  300 mg Rectal Daily  . B-complex with vitamin C  1 tablet Oral Q breakfast  . benztropine  1 mg Oral Daily   Or  . benztropine mesylate  1 mg Intramuscular Daily  . carbamazepine  200 mg Oral BID  . Chlorhexidine Gluconate Cloth  6 each Topical Q0600  . levothyroxine  68 mcg Intravenous Daily  . mupirocin ointment  1 application Nasal BID  . potassium chloride  10 mEq Intravenous Q1 Hr x 5  . sodium chloride  3 mL Intravenous Q12H   Continuous Infusions: . dextrose 5 % 1,000 mL with potassium chloride 40 mEq infusion 125 mL/hr at 07/29/14 1006    Principal Problem:    Acute encephalopathy Active Problems:   Bipolar 1 disorder   Acute kidney injury   Hypernatremia   Dehydration   SIRS (systemic inflammatory response syndrome)   Essential hypertension   Dyslipidemia   Hypothyroidism   ARF (acute renal failure)   Leukocytosis    Time spent: 35 minutes    Vivianne Carles M.D. [facility] Pager [phone]. If 7PM-7AM, please contact night-coverage at [email or web address], password [email or web address] 07/29/2014, 11:49 AM  LOS: 3 days

## 2014-07-29 NOTE — Progress Notes (Addendum)
PT Cancellation Note  Patient Details Name: [name] [name] MRN: [id number] DOB: 1940/10/27   Cancelled Treatment:    Reason Eval/Treat Not Completed: Pt still unable to participate with PT at this time. For 3 consecutive days pt has not been appriopriate for PT. At this time, will sign off. MD, please reorder once pt is able to participate. Thanks.    Rebeca AlertJannie Puneet Selden, MPT Pager: [phone]

## 2014-07-29 NOTE — Progress Notes (Signed)
Clinical Social Work  Patient was discussed during progression meeting and attending MD reports he will consult psych MD for medication management. CSW explained that son requesting consult and is concerned about medication changes. From previous conversation with son, patient was active with ACT team through [facility] prior to admission to SNF in November.   CSW went to room to meet with patient to gather further information. Patient laying in bed and not responding to CSW. Bedside RN reports patient spoke with him this morning but remained confused. CSW will continue to follow.  [place] Derk Dou[place], [place] [phone]

## 2014-07-29 NOTE — Consult Note (Signed)
[facility]   Reason for Consult:  Bipolar medication management and s/p acute encephalopathy Referring Physician:  Dr. Janee Mornhompson  Patient Identification: [name] [name] MRN:  [id number] Principal Diagnosis: Bipolar 1 disorder Diagnosis:   Patient Active Problem List   Diagnosis Date Noted  . ARF (acute renal failure) [N17.9]   . Leukocytosis [D72.829]   . Essential hypertension [I10] 07/26/2014  . Dyslipidemia [E78.5] 07/26/2014  . Hypothyroidism [E03.9] 07/26/2014  . SIRS (systemic inflammatory response syndrome) [A41.9] 06/17/2014  . Acute encephalopathy [G93.40] 06/16/2014  . Hypernatremia [E87.0] 06/16/2014  . Dehydration [E86.0] 06/16/2014  . Bipolar 1 disorder [F31.9] 04/26/2014  . Acute kidney injury [N17.9] 04/26/2014    Total Time spent with patient: 30 minutes  Subjective:   [name] [name] is a 74 y.o. female patient admitted with bipolar disorder.  HPI: [name] [name] is a 74 year old female seen and chart reviewed. Patient is currently suffering with acute metabolic encephalopathy and electrolyte abnormalities and not able to contribute to psychiatric evaluation. Case discussed with Dr. Janee Mornhompson and staff RN and recommended discontinue her medications like Tegretol which can cause electrolyte abnormalities and Cogentin which can cause dehydration. Patient medication can be reintroduced then she was medically cleared are started showing behavioral or emotional problems. Patient appeared lying in her bed with the open-mouth and sleeping without distress. She has a dry mouth and lips.   Medical history: Patient with history of bipolar disorder, hypertension, dyslipidemia, hypothyroidism, CKD stage 3, from nursing home [facility] evaluation of altered mental status. Patient at baseline talkative, able to communicate with others and last seen normal by family about 3 days prior to this admission. No reports of falls, no reports of respiratory  distress, fevers, cough. No reports of vomiting. On admission, BP was stable at 124/68, HR 99, RR 35, Tmax 100.7 F and oxygen saturation of 92 % on room air.Blood work showed WBC count 13.2, sodium 157,chloride 120,creatinine 1.75 and calcium 11.2.CT head did not show acute intracranial findings. CXR showed no acute cardiopulmonary abnormalities. UA showed trace leukocytes. She was started on broad spectrum abx for treatment of possible sepsis.   Past Medical History:  Past Medical History  Diagnosis Date  . Hypertension   . Bipolar 1 disorder   . Hypothyroidism   . Memory difficulties   . HLD (hyperlipidemia)   . Diverticulitis large intestine   . Pneumonia     Past Surgical History  Procedure Laterality Date  . No past surgeries     Family History:  Family History  Problem Relation Age of Onset  . Heart disease Father    Social History:  History  Alcohol Use No     History  Drug Use No    History   Social History  . Marital Status: Single    Spouse Name: N/A  . Number of Children: N/A  . Years of Education: N/A   Social History Main Topics  . Smoking status: Never Smoker   . Smokeless tobacco: Never Used  . Alcohol Use: No  . Drug Use: No  . Sexual Activity: No   Other Topics Concern  . None   Social History Narrative   Additional Social History:                          Allergies:   Allergies  Allergen Reactions  . Antihistamine Decongestant [Triprolidine-Pse] Diarrhea  . Codeine Other (See Comments)  Gi upset     Vitals: Blood pressure 139/78, pulse 105, temperature 100 F (37.8 C), temperature source Axillary, resp. rate 22, height  (1.676 m), weight 62.9 kg (138 lb 10.7 oz), SpO2 99 %.  Risk to Self: Is patient at risk for suicide?: No Risk to Others:   Prior Inpatient Therapy:   Prior Outpatient Therapy:    Current Facility-Administered Medications  Medication Dose Route Frequency Provider Last Rate Last Dose  .  acetaminophen (TYLENOL) suppository 650 mg  650 mg Rectal Q4H PRN Ethelda Chick, MD   650 mg at 07/28/14 0104  . acetaminophen (TYLENOL) tablet 650 mg  650 mg Oral Q6H PRN Alison Murray, MD       Or  . acetaminophen (TYLENOL) suppository 650 mg  650 mg Rectal Q6H PRN Alison Murray, MD   650 mg at 07/28/14 2215  . albuterol (PROVENTIL) (2.5 MG/3ML) 0.083% nebulizer solution 2.5 mg  2.5 mg Nebulization Q4H PRN Alison Murray, MD      . antiseptic oral rinse (BIOTENE) solution 15 mL  15 mL Mouth Rinse PRN Alison Murray, MD      . aspirin suppository 300 mg  300 mg Rectal Daily Rodolph Bong, MD   300 mg at 07/29/14 1008  . B-complex with vitamin C tablet 1 tablet  1 tablet Oral Q breakfast Alison Murray, MD   1 tablet at 07/27/14 0800  . benztropine (COGENTIN) tablet 1 mg  1 mg Oral Daily Rodolph Bong, MD       Or  . benztropine mesylate (COGENTIN) injection 1 mg  1 mg Intramuscular Daily Rodolph Bong, MD   1 mg at 07/29/14 1008  . carbamazepine (TEGRETOL) tablet 200 mg  200 mg Oral BID Alison Murray, MD   200 mg at 07/26/14 2300  . Chlorhexidine Gluconate Cloth 2 % PADS 6 each  6 each Topical Q0600 Marinda Elk, MD   6 each at 07/29/14 0600  . dextrose 5 % 1,000 mL with potassium chloride 40 mEq infusion   Intravenous Continuous Rodolph Bong, MD 150 mL/hr at 07/29/14 1100    . guaiFENesin-dextromethorphan (ROBITUSSIN DM) 100-10 MG/5ML syrup 5 mL  5 mL Oral Q4H PRN Alison Murray, MD      . levothyroxine (SYNTHROID, LEVOTHROID) injection 68 mcg  68 mcg Intravenous Daily Rodolph Bong, MD   68 mcg at 07/29/14 1008  . mupirocin ointment (BACTROBAN) 2 % 1 application  1 application Nasal BID Marinda Elk, MD   1 application at 07/29/14 1008  . ondansetron (ZOFRAN) tablet 4 mg  4 mg Oral Q6H PRN Alison Murray, MD       Or  . ondansetron [facility]) injection 4 mg  4 mg Intravenous Q6H PRN Alison Murray, MD      . phenol (CHLORASEPTIC) mouth spray 2 spray  2 spray  Mouth/Throat Q2H PRN Alison Murray, MD      . sodium chloride 0.9 % injection 3 mL  3 mL Intravenous Q12H Alison Murray, MD   3 mL at 07/29/14 1009    Musculoskeletal: Strength & Muscle Tone: decreased Gait & Station: unable to stand Patient leans: N/A  Psychiatric Specialty Exam: Physical Exam as per the history and physical   ROS dehydration and history of bipolar disorder, currently noncommunicative   Blood pressure 139/78, pulse 105, temperature 100 F (37.8 C), temperature source Axillary, resp. rate 22, height  (1.676  m), weight 62.9 kg (138 lb 10.7 oz), SpO2 99 %.Body mass index is 22.39 kg/(m^2).  General Appearance: Disheveled  Eye Contact::  Poor  Speech:  NA  Volume:  Unable to communicate  Mood:  NA  Affect:  NA  Thought Process:  NA  Orientation:  NA  Thought Content:  NA  Suicidal Thoughts:  Unable to assess  Homicidal Thoughts:  Unable to assess at this time  Memory:  NA  Judgement:  NA  Insight:  NA  Psychomotor Activity:  NA  Concentration:  NA  Recall:  NA  Fund of Knowledge:NA  Language: NA  Akathisia:  Negative  Handed:  Right  AIMS (if indicated):     Assets:  Others:  Need to assess at later date  ADL's:  Impaired  Cognition: Impaired,  Severe  Sleep:      Medical Decision Making: New problem, with additional work up planned, Review of Psycho-Social Stressors (1), Review or order clinical lab tests (1), Established Problem, Worsening (2), Review or order medicine tests (1), Review of Medication Regimen & Side Effects (2) and Review of New Medication or Change in Dosage (2)  Treatment Plan Summary: Daily contact with patient to assess and evaluate symptoms and progress in treatment and Medication management  Plan: Discontinue carbamazepine and benztropine and will reevaluate for psychiatric medication need upon medically stable Supportive therapy provided about ongoing stressors.   Disposition: Need to be assessed when patient medical  condition is more stable  Caelin Rosen,JANARDHAHA R. 07/29/2014 3:51 PM

## 2014-07-30 ENCOUNTER — Inpatient Hospital Stay (HOSPITAL_COMMUNITY)
Admission: EM | Admit: 2014-07-30 | Discharge: 2014-07-30 | Disposition: A | Discharge status: Home / Self Care | Payer: Medicare Other | Source: Home / Self Care | Attending: Internal Medicine | Admitting: Internal Medicine

## 2014-07-30 DIAGNOSIS — G9340 Encephalopathy, unspecified: Secondary | ICD-10-CM

## 2014-07-30 LAB — CBC
HCT: 38.8 % (ref 36.0–46.0)
HEMOGLOBIN: 11.2 g/dL — AB (ref 12.0–15.0)
MCH: 29.8 pg (ref 26.0–34.0)
MCHC: 28.9 g/dL — ABNORMAL LOW (ref 30.0–36.0)
MCV: 103.2 fL — AB (ref 78.0–100.0)
Platelets: 129 10*3/uL — ABNORMAL LOW (ref 150–400)
RBC: 3.76 MIL/uL — ABNORMAL LOW (ref 3.87–5.11)
RDW: 12.9 % (ref 11.5–15.5)
WBC: 8.5 10*3/uL (ref 4.0–10.5)

## 2014-07-30 LAB — CARBAMAZEPINE, FREE AND TOTAL
Carbamazepine Metabolite -: 0.3 ug/mL (ref 0.2–2.0)
Carbamazepine Metabolite/: 0.1 ug/mL
Carbamazepine Metabolite: 0.2 ug/mL (ref 0.1–1.0)
Carbamazepine, Bound: 0.9 ug/mL
Carbamazepine, Total: 0.9 ug/mL — ABNORMAL LOW (ref 4.0–12.0)

## 2014-07-30 LAB — BASIC METABOLIC PANEL
Anion gap: 5 (ref 5–15)
Anion gap: 3 — ABNORMAL LOW (ref 5–15)
Anion gap: 3 — ABNORMAL LOW (ref 5–15)
BUN: 26 mg/dL — ABNORMAL HIGH (ref 6–23)
BUN: 23 mg/dL (ref 6–23)
BUN: 23 mg/dL (ref 6–23)
BUN: 19 mg/dL (ref 6–23)
CHLORIDE: 124 mmol/L — AB (ref 96–112)
CHLORIDE: 115 mmol/L — AB (ref 96–112)
CO2: 23 mmol/L (ref 19–32)
CO2: 25 mmol/L (ref 19–32)
CO2: 25 mmol/L (ref 19–32)
CO2: 25 mmol/L (ref 19–32)
CREATININE: 1.3 mg/dL — AB (ref 0.50–1.10)
CREATININE: 0.99 mg/dL (ref 0.50–1.10)
Calcium: 9.4 mg/dL (ref 8.4–10.5)
Calcium: 9.8 mg/dL (ref 8.4–10.5)
Calcium: 9.7 mg/dL (ref 8.4–10.5)
Calcium: 9 mg/dL (ref 8.4–10.5)
Chloride: 124 mmol/L — ABNORMAL HIGH (ref 96–112)
Creatinine, Ser: 1.46 mg/dL — ABNORMAL HIGH (ref 0.50–1.10)
Creatinine, Ser: 1.39 mg/dL — ABNORMAL HIGH (ref 0.50–1.10)
GFR calc Af Amer: 40 mL/min — ABNORMAL LOW (ref >90)
GFR calc Af Amer: 42 mL/min — ABNORMAL LOW (ref >90)
GFR calc Af Amer: 46 mL/min — ABNORMAL LOW (ref >90)
GFR calc non Af Amer: 34 mL/min — ABNORMAL LOW (ref >90)
GFR calc non Af Amer: 36 mL/min — ABNORMAL LOW (ref >90)
GFR calc non Af Amer: 39 mL/min — ABNORMAL LOW (ref >90)
GFR calc non Af Amer: 55 mL/min — ABNORMAL LOW (ref >90)
GFR, EST AFRICAN AMERICAN: 63 mL/min — AB (ref >90)
GLUCOSE: 153 mg/dL — AB (ref 70–99)
GLUCOSE: 153 mg/dL — AB (ref 70–99)
Glucose, Bld: 167 mg/dL — ABNORMAL HIGH (ref 70–99)
Glucose, Bld: 94 mg/dL (ref 70–99)
POTASSIUM: 3.7 mmol/L (ref 3.5–5.1)
Potassium: 6.1 mmol/L (ref 3.5–5.1)
Potassium: 3.9 mmol/L (ref 3.5–5.1)
Potassium: 3.3 mmol/L — ABNORMAL LOW (ref 3.5–5.1)
SODIUM: 151 mmol/L — AB (ref 135–145)
SODIUM: 154 mmol/L — AB (ref 135–145)
Sodium: 152 mmol/L — ABNORMAL HIGH (ref 135–145)
Sodium: 143 mmol/L (ref 135–145)

## 2014-07-30 LAB — GLUCOSE, CAPILLARY: GLUCOSE-CAPILLARY: 140 mg/dL — AB (ref 70–99)

## 2014-07-30 LAB — PROCALCITONIN

## 2014-07-30 MED ORDER — CHLORHEXIDINE GLUCONATE 0.12 % MT SOLN
15.0000 mL | Freq: Two times a day (BID) | OROMUCOSAL | Status: DC
  Administered 2014-07-30 – 2014-08-06 (×11): 15 mL via OROMUCOSAL
  Filled 2014-07-30 (×16): qty 15

## 2014-07-30 MED ORDER — DEXTROSE 5 % IV BOLUS
2000.0000 mL | Freq: Once | INTRAVENOUS | Status: AC
  Administered 2014-07-30: 1000 mL via INTRAVENOUS

## 2014-07-30 MED ORDER — CETYLPYRIDINIUM CHLORIDE 0.05 % MT LIQD
7.0000 mL | Freq: Two times a day (BID) | OROMUCOSAL | Status: DC
  Administered 2014-07-30 – 2014-08-05 (×9): 7 mL via OROMUCOSAL

## 2014-07-30 NOTE — Procedures (Signed)
ELECTROENCEPHALOGRAM REPORT  Date of Study: 07/30/2014  Patient's Name: [name] [name] MRN: [id number] Date of Birth: 16-Dec-1940  Referring Provider: Dr. Ramiro Harvestaniel Thompson  Clinical History: This is a 74 year old woman with altered mental status, hypernatremia  Medications: carbamazepine Aspirin B-complex with vitamin C  benztropine Levothyroxine  Technical Summary: A multichannel digital EEG recording measured by the international 10-20 system with electrodes applied with paste and impedances below 5000 ohms performed as portable with EKG monitoring in an an unresponsive patient.  Hyperventilation and photic stimulation were not performed.  The digital EEG was referentially recorded, reformatted, and digitally filtered in a variety of bipolar and referential montages for optimal display.   Description: The patient is awake and asleep during the recording. She is noted to be unresponsive, non-verbal.  There is no clear posterior dominant rhythm. The background consists of a large amount of diffuse 4-6 Hz theta and occasional diffuse 2-3 Hz delta slowing. There were occasional low voltage sharp transients seen over the bilateral temporal regions, left>right.  Sleep architecture with poorly formed vertex waves are seen. With noxious stimulation, there is increase in muscle artifact. Hyperventilation and photic stimulation were not performed. There were no clear epileptiform discharges or electrographic seizures seen.    EKG lead was unremarkable.  Impression: This awake and asleep EEG is abnormal due to moderate diffuse slowing of the waking background.  Clinical Correlation of the above findings indicates diffuse cerebral dysfunction that is non-specific in etiology and can be seen with hypoxic/ischemic injury, toxic/metabolic encephalopathies, neurodegenerative disorders, or medication effect. Low voltage sharp transients noted above are of unclear clinical significance.  The absence of  epileptiform discharges does not rule out a clinical diagnosis of epilepsy.  Clinical correlation is advised.   [name] [name], M.D.

## 2014-07-30 NOTE — Progress Notes (Signed)
Offsite EEG completed at [facility]. Results pending. 

## 2014-07-30 NOTE — Evaluation (Signed)
Clinical/Bedside Swallow Evaluation Patient Details  Name: [name] [name] MRN: [id number] Date of Birth: Nov 26, 1940  Today's Date: 07/30/2014 Time: SLP Start Time (ACUTE ONLY): 1421 SLP Stop Time (ACUTE ONLY): 1448 SLP Time Calculation (min) (ACUTE ONLY): 27 min  Past Medical History:  Past Medical History  Diagnosis Date  . Hypertension   . Bipolar 1 disorder   . Hypothyroidism   . Memory difficulties   . HLD (hyperlipidemia)   . Diverticulitis large intestine   . Pneumonia    Past Surgical History:  Past Surgical History  Procedure Laterality Date  . No past surgeries     HPI:  [age] year old female with history of bipolar disorder, hypertension, dyslipidemia, pna, hypothyroidism, CKD stage 3, from nursing home admitted with altered mental status. CT 2/22 No acute intracranial abnormality. CXR No acute cardiopulmonary abnormality seen. MBS 06/23/14 with penetration and trace silent aspiration iwth thin and decreased esophageal clearance, nectar thick liquids and sugessted MD may wish to continue liquids only; not certain if pt was started on solids.   Assessment / Plan / Recommendation Clinical Impression  Laryngeal compromise suspected with nectar thick liquid (on nectar prior to admission) and puree with decreased laryngeal ROM during palpation. Decreased endurance, general fatigue, weakness leads to recommendation of continued NPO with MBS 2/27. Copious mucous cleaned from hard palate, buccal cavities and tongue. Continue aggressive oral care.      Aspiration Risk  Severe    Diet Recommendation NPO   Medication Administration: Via alternative means    Other  Recommendations Recommended Consults: MBS Oral Care Recommendations: Oral care Q4 per protocol   Follow Up Recommendations   (TBD)    Frequency and Duration        Pertinent Vitals/Pain No indications pain         Swallow Study          Oral/Motor/Sensory Function Overall Oral Motor/Sensory Function:   (generalized weakness)   Ice Chips Ice chips: Not tested   Thin Liquid Thin Liquid: Not tested    Nectar Thick Nectar Thick Liquid: Impaired Presentation: Cup;Spoon Oral Phase Impairments: Reduced lingual movement/coordination Oral phase functional implications: Prolonged oral transit Pharyngeal Phase Impairments: Suspected delayed Swallow;Decreased hyoid-laryngeal movement;Throat Clearing - Immediate   Honey Thick Honey Thick Liquid: Not tested   Puree Puree: Impaired Oral Phase Impairments: Reduced lingual movement/coordination Oral Phase Functional Implications: Prolonged oral transit Pharyngeal Phase Impairments: Suspected delayed Swallow;Decreased hyoid-laryngeal movement;Multiple swallows;Throat Clearing - Delayed   Solid   GO    Solid: Not tested       [name] [name], [name] [name] 07/30/2014,3:04 PM  [name] [name] M.Ed [employer] Pager [phone]

## 2014-07-30 NOTE — Progress Notes (Signed)
[facility] PROGRESS NOTE  [name] [name] ZOX:[id number] DOB: 05/05/1941 DOA: 07/26/2014 PCP: Astrid DivineGRIFFIN,ELAINE COLLINS, MD  Assessment/Plan: #1 acute encephalopathy Likely secondary to hypernatremia in the setting of acute renal failure. Doubt if infectious etiology. Clinical improvement.  Chest x-ray was negative. CT head negative. Urinalysis was nitrite negative leukocytes negative. Urine cultures have been negative. Blood cultures pending with no growth to date. Patient's sodium level this morning is 155 from 167. Continue D5W. Give a bolus of IV fluids. Serial basic metabolic profiles. Once hypernatremia resolves will not resume lithium.Follow.  #2 hypernatremia Likely secondary to dehydration and probable DI from lithium. Continue D5W. Will give a bolus of IV fluids. Serial BMETs.  #3 acute renal failure Likely prerenal azotemia vs ATN as patient does look dehydrated and noted to be hypernatremic and was on Lithium PTA. Baseline creatinine 1-1.2. Renal ultrasound negative for hydronephrosis. Continue IV fluids. Follow.  #4 leukocytosis Questionable etiology. Patient is currently afebrile. Patient had been on IV Vanco and Zosyn and currently on IV Rocephin. No source of infection noted. Urine cultures are negative. Blood cultures pending today. Antibiotic have been discontinued. WBC improved. Follow.   #5 hypertension Stable. Patient not taking oral medications at this point in time. Will follow for now.  #6 dyslipidemia When tolerating oral intake will resume statin.  #7 hypothyroidism TSH was decreased. Decreased Synthroid. Will need repeat thyroid function studies done in about 4-6 weeks.  #8 bipolar 1 disorder Will continue to hold lithium and Tegretol and Cogentin   for now as patient is acute renal failure and lithium may accumulate. Lithium should not be resumed as may have lead to nephrogenic DI.  Follow for now.   Code Status: Full Family Communication: No family at  bedside. Disposition Plan: Remain inpatient.   Consultants:  None  Procedures:  CT head 07/26/2014  Chest x-ray 07/26/2014  Renal ultrasound 07/28/2014  Antibiotics:  IV Zosyn 07/26/2014>>> 07/27/2014  IV vancomycin to 22 2016>>> 07/27/2014  IV Rocephin 07/27/2014>>>> 07/28/2014  HPI/Subjective: Patient opens eyes to verbal stimuli. Patient ff commands and interacting.   Objective: Filed Vitals:   07/30/14 1529  BP: 99/51  Pulse: 58  Temp: 97.9 F (36.6 C)  Resp: 20    Intake/Output Summary (Last 24 hours) at 07/30/14 1756 Last data filed at 07/30/14 0600  Gross per 24 hour  Intake   3472 ml  Output   1050 ml  Net   2422 ml   Filed Weights   07/27/14 0543 07/29/14 0531 07/30/14 0440  Weight: 81.6 kg (179 lb 14.3 oz) 62.9 kg (138 lb 10.7 oz) 61.4 kg (135 lb 5.8 oz)    Exam:   General:  NAD. Opens his eyes to verbal stimuli. Following commands. Extremely dry mucous membranes.  Cardiovscular: Regular rate rhythm no murmurs rubs or gallops.  Respiratory: Clear auscultation bilaterally anterior lung fields.  Abdomen: Obese, soft, nontender, nondistended, positive bowel sounds.  Musculoskeletal: No clubbing cyanosis or edema.  Data Reviewed: Basic Metabolic Panel:  Recent Labs Lab 07/26/14 1915  07/28/14 0550  07/29/14 0900 07/29/14 1609 07/29/14 1715 07/29/14 2200 07/30/14 0615 07/30/14 0950  NA 156*  < > 167*  < > 163* 151*  --  151* 154* 152*  K 3.9  < > 4.1  < > 3.9 6.1* 4.1 4.0 3.9 3.7  CL 125*  < > >130*  < > >130* >130*  --  123* 124* 124*  CO2 26  < > 24  < > 22 23  --  GLUCOSE 107*  < > 160*  < > 125* 167*  --  128* 153* 153*  BUN 19  < > 30*  < > 30* 26*  --  26* 23 23  CREATININE 1.61*  < > 2.02*  < > 1.49* 1.46*  --  1.35* 1.39* 1.30*  CALCIUM 9.9  < > 10.9*  < > 10.3 9.4  --  9.7 9.8 9.7  MG 2.3  --  2.7*  --  2.3  --   --   --   --   --   PHOS 4.1  --   --   --   --   --   --   --   --   --   < > = values in this  interval not displayed. Liver Function Tests:  Recent Labs Lab 07/26/14 1033 07/26/14 1915 07/28/14 0550  AST 38* 43* 29  ALT 38* 42* 34  ALKPHOS 119* 94 101  BILITOT 0.7 0.6 0.2*  PROT 7.3 5.9* 7.1  ALBUMIN 3.9 3.1* 3.6   No results for input(s): LIPASE, AMYLASE in the last 168 hours. No results for input(s): AMMONIA in the last 168 hours. CBC:  Recent Labs Lab 07/26/14 1033 07/28/14 0550 07/29/14 0505 07/30/14 0615  WBC 13.2* 17.2* 10.0 8.5  NEUTROABS 11.2* 14.6* 7.8*  --   HGB 14.3 14.3 12.3 11.2*  HCT 48.3* 50.5* 43.5 38.8  MCV 101.5* 106.3* 105.8* 103.2*  PLT 332 212 154 129*   Cardiac Enzymes: No results for input(s): CKTOTAL, CKMB, CKMBINDEX, TROPONINI in the last 168 hours. BNP (last 3 results) No results for input(s): BNP in the last 8760 hours.  ProBNP (last 3 results) No results for input(s): PROBNP in the last 8760 hours.  CBG:  Recent Labs Lab 07/26/14 1016 07/27/14 0802 07/28/14 0745 07/29/14 0738 07/30/14 0802  GLUCAP 167* 91 146* 113* 140*    Recent Results (from the past 240 hour(s))  Blood culture (routine x 2)     Status: None (Preliminary result)   Collection Time: 07/26/14 10:33 AM  Result Value Ref Range Status   Specimen Description BLOOD LEFT HAND  Final   Special Requests BOTTLES DRAWN AEROBIC AND ANAEROBIC 5 CC EACH  Final   Culture   Final           BLOOD CULTURE RECEIVED NO GROWTH TO DATE CULTURE WILL BE HELD FOR 5 DAYS BEFORE ISSUING A FINAL NEGATIVE REPORT Performed at [facility]    Report Status PENDING  Incomplete  Blood culture (routine x 2)     Status: None (Preliminary result)   Collection Time: 07/26/14 10:39 AM  Result Value Ref Range Status   Specimen Description BLOOD LEFT WRIST  Final   Special Requests BOTTLES DRAWN AEROBIC AND ANAEROBIC 3 CC EA  Final   Culture   Final           BLOOD CULTURE RECEIVED NO GROWTH TO DATE CULTURE WILL BE HELD FOR 5 DAYS BEFORE ISSUING A FINAL NEGATIVE  REPORT Performed at [facility]    Report Status PENDING  Incomplete  Urine culture     Status: None   Collection Time: 07/26/14 10:53 AM  Result Value Ref Range Status   Specimen Description URINE, CATHETERIZED  Final   Special Requests NONE  Final   Colony Count NO GROWTH Performed at [facility]   Final   Culture NO GROWTH Performed at [facility]  Final   Report Status 07/27/2014 FINAL  Final  MRSA PCR Screening     Status: Abnormal   Collection Time: 07/27/14 10:31 AM  Result Value Ref Range Status   MRSA by PCR POSITIVE (A) NEGATIVE Final    Comment:        The GeneXpert MRSA Assay (FDA approved for NASAL specimens only), is one component of a comprehensive MRSA colonization surveillance program. It is not intended to diagnose MRSA infection nor to guide or monitor treatment for MRSA infections. RESULT CALLED TO, READ BACK BY AND VERIFIED WITH: Ramond Craver [id number] @ 1335 BY J SCOTTON   Culture, Urine     Status: None   Collection Time: 07/28/14 11:33 AM  Result Value Ref Range Status   Specimen Description URINE, CATHETERIZED  Final   Special Requests NONE  Final   Colony Count NO GROWTH Performed at [facility]   Final   Culture NO GROWTH Performed at [facility]   Final   Report Status 07/29/2014 FINAL  Final     Studies: No results found.  Scheduled Meds: . antiseptic oral rinse  7 mL Mouth Rinse q12n4p  . aspirin  300 mg Rectal Daily  . B-complex with vitamin C  1 tablet Oral Q breakfast  . chlorhexidine  15 mL Mouth Rinse BID  . Chlorhexidine Gluconate Cloth  6 each Topical Q0600  . dextrose  1 ampule Intravenous Once  . insulin aspart  10 Units Intravenous Once  . levothyroxine  68 mcg Intravenous Daily  . mupirocin ointment  1 application Nasal BID  . sodium chloride  3 mL Intravenous Q12H   Continuous Infusions: . dextrose 5 % 1,000 mL infusion 150 mL/hr at 07/29/14 1811    Principal  Problem:   Acute encephalopathy Active Problems:   Hypernatremia   Bipolar 1 disorder   Acute kidney injury   Dehydration   SIRS (systemic inflammatory response syndrome)   Essential hypertension   Dyslipidemia   Hypothyroidism   ARF (acute renal failure)   Leukocytosis    Time spent: 35 minutes    Addison Freimuth M.D. [facility] Pager [phone]. If 7PM-7AM, please contact night-coverage at [email or web address], password [facility] 07/30/2014, 5:56 PM  LOS: 4 days

## 2014-07-30 NOTE — Progress Notes (Signed)
Clinical Social Work  Per attending MD, no anticipated weekend DC. CSW will continue to follow and will assist with transfer back to SNF when medically stable. CSW rounded with psych MD but patient unable to communicate and engage in assessment during rounds.  [place] Jaana Brodt, [place]CSW [phone]

## 2014-07-31 ENCOUNTER — Inpatient Hospital Stay (HOSPITAL_COMMUNITY): Payer: Medicare Other

## 2014-07-31 DIAGNOSIS — T50905A Adverse effect of unspecified drugs, medicaments and biological substances, initial encounter: Secondary | ICD-10-CM

## 2014-07-31 DIAGNOSIS — N170 Acute kidney failure with tubular necrosis: Secondary | ICD-10-CM

## 2014-07-31 DIAGNOSIS — N251 Nephrogenic diabetes insipidus: Secondary | ICD-10-CM | POA: Diagnosis present

## 2014-07-31 LAB — BASIC METABOLIC PANEL
ANION GAP: 3 — AB (ref 5–15)
BUN: 16 mg/dL (ref 6–23)
CO2: 25 mmol/L (ref 19–32)
Calcium: 9.4 mg/dL (ref 8.4–10.5)
Chloride: 120 mmol/L — ABNORMAL HIGH (ref 96–112)
Creatinine, Ser: 1.04 mg/dL (ref 0.50–1.10)
GFR calc Af Amer: 60 mL/min — ABNORMAL LOW (ref >90)
GFR, EST NON AFRICAN AMERICAN: 52 mL/min — AB (ref >90)
Glucose, Bld: 128 mg/dL — ABNORMAL HIGH (ref 70–99)
Potassium: 3.5 mmol/L (ref 3.5–5.1)
Sodium: 148 mmol/L — ABNORMAL HIGH (ref 135–145)

## 2014-07-31 LAB — MAGNESIUM: Magnesium: 2 mg/dL (ref 1.5–2.5)

## 2014-07-31 LAB — GLUCOSE, CAPILLARY: Glucose-Capillary: 121 mg/dL — ABNORMAL HIGH (ref 70–99)

## 2014-07-31 MED ORDER — RESOURCE THICKENUP CLEAR PO POWD
ORAL | Status: DC | PRN
  Filled 2014-07-31 (×2): qty 125

## 2014-07-31 MED ORDER — POTASSIUM CHLORIDE 10 MEQ/100ML IV SOLN
10.0000 meq | INTRAVENOUS | Status: AC
  Administered 2014-07-31 (×3): 10 meq via INTRAVENOUS
  Filled 2014-07-31 (×3): qty 100

## 2014-07-31 NOTE — Procedures (Signed)
Objective Swallowing Evaluation: Modified Barium Swallowing Study  Patient Details  Name: [name] [name] MRN: [id number] Date of Birth: 03-29-1941  Today's Date: 07/31/2014 Time: SLP Start Time (ACUTE ONLY): 1600-SLP Stop Time (ACUTE ONLY): 1620 SLP Time Calculation (min) (ACUTE ONLY): 20 min  Past Medical History:  Past Medical History  Diagnosis Date  . Hypertension   . Bipolar 1 disorder   . Hypothyroidism   . Memory difficulties   . HLD (hyperlipidemia)   . Diverticulitis large intestine   . Pneumonia    Past Surgical History:  Past Surgical History  Procedure Laterality Date  . No past surgeries     HPI:  HPI: 74 year old female with history of bipolar disorder, hypertension, dyslipidemia, pna, hypothyroidism, CKD stage 3, from nursing home admitted with altered mental status. CT 2/22 No acute intracranial abnormality. CXR No acute cardiopulmonary abnormality seen. MBS 06/23/14 with penetration and trace silent aspiration iwth thin and decreased esophageal clearance, nectar thick liquids and sugessted MD may wish to continue liquids only; not certain if pt was started on solids. MBS recommened to objectively assess current swallow function.  No Data Recorded  Assessment / Plan / Recommendation CHL IP CLINICAL IMPRESSIONS 07/31/2014  Dysphagia Diagnosis Moderate oral phase dysphagia;Moderate pharyngeal phase dysphagia  Clinical impression Pt demonstrated moderate oral dysphagia characterized by motor weakness and delayed transit. Oropharyngeal phase deficits with decreased tongue base retraction resulting in moderate amounts of residue posterior tongue without consistent sensation. Verbal cue for 2 swallows assisted in mild-moderate clearance. Pharyngeal phase described with sensorimotor impairments leading to delayed swallow initiation with nectar to valleculae and mild vallecular residue. Silent penetration observed with large sip nectar barium. Honey thick resulted in increased  base of tongue residue and required 3 swallows to decrease. Pt masticated Dys 2 texture (graham cracker in applesauce) with functional manipulation and tranist. Recommend Dys 2 texture and nectar thick liquids, no straws, pills whole in applesauce (if able) and full assistance for compensatory strategies. Pt stated "I can't eat puree food. It looks nasty and makes me throw up." Continued ST.       CHL IP TREATMENT RECOMMENDATION 07/31/2014  Treatment Plan Recommendations Therapy as outlined in treatment plan below     CHL IP DIET RECOMMENDATION 07/31/2014  Diet Recommendations Dysphagia 2 (Fine chop);Nectar-thick liquid  Liquid Administration via Cup;No straw  Medication Administration Whole meds with puree  Compensations Slow rate;Small sips/bites;Check for pocketing;Multiple dry swallows after each bite/sip  Postural Changes and/or Swallow Maneuvers Seated upright 90 degrees;Upright 30-60 min after meal     CHL IP OTHER RECOMMENDATIONS 07/31/2014  Recommended Consults (None)  Oral Care Recommendations Oral care Q4 per protocol  Other Recommendations (None)     CHL IP FOLLOW UP RECOMMENDATIONS 07/31/2014  Follow up Recommendations Skilled Nursing facility     [facility] IP FREQUENCY AND DURATION 07/31/2014  Speech Therapy Frequency (ACUTE ONLY) min 2x/week  Treatment Duration 2 weeks     Pertinent Vitals/Pain none     No flowsheet data found.  No flowsheet data found.    CHL IP REASON FOR REFERRAL 07/31/2014  Reason for Referral Objectively evaluate swallowing function     CHL IP ORAL PHASE 07/31/2014  Lips (None)  Tongue (None)  Mucous membranes (None)  Nutritional status (None)  Other (None)  Oxygen therapy (None)  Oral Phase Impaired  Oral - Pudding Teaspoon (None)  Oral - Pudding Cup (None)  Oral - Honey Teaspoon Lingual/palatal residue;Delayed oral transit;Right anterior bolus loss  Oral - Honey Cup  Lingual/palatal residue;Delayed oral transit;Right anterior bolus loss  Oral -  Honey Syringe (None)  Oral - Nectar Teaspoon Lingual/palatal residue;Delayed oral transit;Right anterior bolus loss  Oral - Nectar Cup Lingual/palatal residue;Delayed oral transit;Right anterior bolus loss  Oral - Nectar Straw (None)  Oral - Nectar Syringe (None)  Oral - Ice Chips (None)  Oral - Thin Teaspoon (None)  Oral - Thin Cup (None)  Oral - Thin Straw (None)  Oral - Thin Syringe (None)  Oral - Puree Lingual/palatal residue;Delayed oral transit  Oral - Mechanical Soft Delayed oral transit  Oral - Regular (None)  Oral - Multi-consistency (None)  Oral - Pill (None)  Oral Phase - Comment (None)      CHL IP PHARYNGEAL PHASE 07/31/2014  Pharyngeal Phase Impaired  Pharyngeal - Pudding Teaspoon (None)  Penetration/Aspiration details (pudding teaspoon) (None)  Pharyngeal - Pudding Cup (None)  Penetration/Aspiration details (pudding cup) (None)  Pharyngeal - Honey Teaspoon Pharyngeal residue - valleculae;Reduced tongue base retraction  Penetration/Aspiration details (honey teaspoon) (None)  Pharyngeal - Honey Cup Pharyngeal residue - valleculae;Reduced tongue base retraction  Penetration/Aspiration details (honey cup) (None)  Pharyngeal - Honey Syringe (None)  Penetration/Aspiration details (honey syringe) (None)  Pharyngeal - Nectar Teaspoon Delayed swallow initiation;Premature spillage to valleculae;Reduced tongue base retraction  Penetration/Aspiration details (nectar teaspoon) (None)  Pharyngeal - Nectar Cup Delayed swallow initiation;Premature spillage to valleculae;Reduced tongue base retraction;Penetration/Aspiration during swallow;Pharyngeal residue - valleculae  Penetration/Aspiration details (nectar cup) Material enters airway, remains ABOVE vocal cords and not ejected out  Pharyngeal - Nectar Straw (None)  Penetration/Aspiration details (nectar straw) (None)  Pharyngeal - Nectar Syringe (None)  Penetration/Aspiration details (nectar syringe) (None)  Pharyngeal - Ice Chips  (None)  Penetration/Aspiration details (ice chips) (None)  Pharyngeal - Thin Teaspoon (None)  Penetration/Aspiration details (thin teaspoon) (None)  Pharyngeal - Thin Cup (None)  Penetration/Aspiration details (thin cup) (None)  Pharyngeal - Thin Straw (None)  Penetration/Aspiration details (thin straw) (None)  Pharyngeal - Thin Syringe (None)  Penetration/Aspiration details (thin syringe') (None)  Pharyngeal - Puree Pharyngeal residue - valleculae;Reduced tongue base retraction  Penetration/Aspiration details (puree) (None)  Pharyngeal - Mechanical Soft (None)  Penetration/Aspiration details (mechanical soft) (None)  Pharyngeal - Regular (None)  Penetration/Aspiration details (regular) (None)  Pharyngeal - Multi-consistency (None)  Penetration/Aspiration details (multi-consistency) (None)  Pharyngeal - Pill (None)  Penetration/Aspiration details (pill) (None)  Pharyngeal Comment (None)     CHL IP CERVICAL ESOPHAGEAL PHASE 07/31/2014  Cervical Esophageal Phase WFL  Pudding Teaspoon (None)  Pudding Cup (None)  Honey Teaspoon (None)  Honey Cup (None)  Honey Syringe (None)  Nectar Teaspoon (None)  Nectar Cup (None)  Nectar Straw (None)  Nectar Syringe (None)  Thin Teaspoon (None)  Thin Cup (None)  Thin Straw (None)  Thin Syringe (None)  Cervical Esophageal Comment (None)    No flowsheet data found.         Royce MacadamiaLitaker, Vicente Weidler Willis 07/31/2014, 4:47 PM  Breck CoonsLisa Willis Lonell FaceLitaker M.Ed [employer]P Pager [phone]

## 2014-07-31 NOTE — Progress Notes (Signed)
[facility] PROGRESS NOTE  [name] [name] ZOX:[id number] DOB: 04/08/1941 DOA: 07/26/2014 PCP: Astrid DivineGRIFFIN,ELAINE COLLINS, MD  Assessment/Plan: #1 acute encephalopathy Likely secondary to hypernatremia in the setting of acute renal failure. Clinical improvement.  Chest x-ray was negative. CT head negative. Urinalysis was nitrite negative leukocytes negative. Urine cultures have been negative. Blood cultures pending with no growth to date. Patient's sodium level this morning is 148 from 155 from 167. Continue D5W. Serial basic metabolic profiles. Once hypernatremia resolves will not resume lithium again.Follow.  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 Renal function and sodium levels improving. Once sodium levels normalized will place on thiazide 25mg  daily. Will need to be on low salt/low protein diet, and need weekly BMET. Will need renal outpatient follow up. No more lithium on d/c.  #3 hypernatremia Likely secondary to dehydration and DI from lithium. Continue D5W. Serial BMETs.  #4 acute renal failure Likely prerenal azotemia and ATN as patient does look dehydrated and noted to be hypernatremic and was on Lithium PTA. Baseline creatinine 1-1.2. Renal ultrasound negative for hydronephrosis. Continue IV fluids. Renal function trending down. Will need weekly BMET and probable outpatient follow up with renal. Follow.  #5 leukocytosis Questionable etiology. Patient is currently afebrile. Patient had been on IV Vanco and Zosyn and currently on IV Rocephin. No source of infection noted. Urine cultures are negative. Blood cultures pending today. Antibiotic have been discontinued. WBC improved. Follow.   #6 hypertension Stable. Patient not taking oral medications at this point in time. Will follow for now.  #7 dyslipidemia When tolerating oral intake will resume statin.  #8 hypothyroidism TSH was decreased. Decreased Synthroid. Will need repeat thyroid function studies done in about  4-6 weeks.  #9 bipolar 1 disorder Will continue to hold lithium and Tegretol and Cogentin   for now as patient is acute renal failure and lithium may accumulate. Lithium should not be resumed as likely lead to nephrogenic DI.  Follow for now.   Code Status: Full Family Communication: No family at bedside. Disposition Plan: Remain inpatient.   Consultants:  None  Procedures:  CT head 07/26/2014  Chest x-ray 07/26/2014  Renal ultrasound 07/28/2014  Antibiotics:  IV Zosyn 07/26/2014>>> 07/27/2014  IV vancomycin to 22 2016>>> 07/27/2014  IV Rocephin 07/27/2014>>>> 07/28/2014  HPI/Subjective: Patient opens eyes to verbal stimuli. Patient ff commands and interacting. Patient sitting up in chair.  Objective: Filed Vitals:   07/31/14 0448  BP: 107/64  Pulse: 74  Temp: 98.5 F (36.9 C)  Resp: 19    Intake/Output Summary (Last 24 hours) at 07/31/14 1351 Last data filed at 07/31/14 1342  Gross per 24 hour  Intake   3600 ml  Output   4275 ml  Net   -675 ml   Filed Weights   07/29/14 0531 07/30/14 0440 07/31/14 0448  Weight: 62.9 kg (138 lb 10.7 oz) 61.4 kg (135 lb 5.8 oz) 63.3 kg (139 lb 8.8 oz)    Exam:   General:  NAD. Opens his eyes to verbal stimuli. Sitting up in chair. Following commands. Extremely dry mucous membranes.  Cardiovscular: Regular rate rhythm no murmurs rubs or gallops.  Respiratory: Clear auscultation bilaterally anterior lung fields.  Abdomen: Obese, soft, nontender, nondistended, positive bowel sounds.  Musculoskeletal: No clubbing cyanosis or edema.  Data Reviewed: Basic Metabolic Panel:  Recent Labs Lab 07/26/14 1915  07/28/14 0550  07/29/14 0900  07/29/14 2200 07/30/14 0615 07/30/14 0950 07/30/14 1914 07/31/14 0555  NA 156*  < > 167*  < > 163*  < >  151* 154* 152* 143 148*  K 3.9  < > 4.1  < > 3.9  < > 4.0 3.9 3.7 3.3* 3.5  CL 125*  < > >130*  < > >130*  < > 123* 124* 124* 115* 120*  CO2 26  < > 24  < > 22  < > GLUCOSE 107*  < > 160*  < > 125*  < > 128* 153* 153* 94 128*  BUN 19  < > 30*  < > 30*  < > 26* CREATININE 1.61*  < > 2.02*  < > 1.49*  < > 1.35* 1.39* 1.30* 0.99 1.04  CALCIUM 9.9  < > 10.9*  < > 10.3  < > 9.7 9.8 9.7 9.0 9.4  MG 2.3  --  2.7*  --  2.3  --   --   --   --   --  2.0  PHOS 4.1  --   --   --   --   --   --   --   --   --   --   < > = values in this interval not displayed. Liver Function Tests:  Recent Labs Lab 07/26/14 1033 07/26/14 1915 07/28/14 0550  AST 38* 43* 29  ALT 38* 42* 34  ALKPHOS 119* 94 101  BILITOT 0.7 0.6 0.2*  PROT 7.3 5.9* 7.1  ALBUMIN 3.9 3.1* 3.6   No results for input(s): LIPASE, AMYLASE in the last 168 hours. No results for input(s): AMMONIA in the last 168 hours. CBC:  Recent Labs Lab 07/26/14 1033 07/28/14 0550 07/29/14 0505 07/30/14 0615  WBC 13.2* 17.2* 10.0 8.5  NEUTROABS 11.2* 14.6* 7.8*  --   HGB 14.3 14.3 12.3 11.2*  HCT 48.3* 50.5* 43.5 38.8  MCV 101.5* 106.3* 105.8* 103.2*  PLT 332 212 154 129*   Cardiac Enzymes: No results for input(s): CKTOTAL, CKMB, CKMBINDEX, TROPONINI in the last 168 hours. BNP (last 3 results) No results for input(s): BNP in the last 8760 hours.  ProBNP (last 3 results) No results for input(s): PROBNP in the last 8760 hours.  CBG:  Recent Labs Lab 07/27/14 0802 07/28/14 0745 07/29/14 0738 07/30/14 0802 07/31/14 0736  GLUCAP 91 146* 113* 140* 121*    Recent Results (from the past 240 hour(s))  Blood culture (routine x 2)     Status: None (Preliminary result)   Collection Time: 07/26/14 10:33 AM  Result Value Ref Range Status   Specimen Description BLOOD LEFT HAND  Final   Special Requests BOTTLES DRAWN AEROBIC AND ANAEROBIC 5 CC EACH  Final   Culture   Final           BLOOD CULTURE RECEIVED NO GROWTH TO DATE CULTURE WILL BE HELD FOR 5 DAYS BEFORE ISSUING A FINAL NEGATIVE REPORT Performed at [facility]    Report Status PENDING  Incomplete  Blood culture  (routine x 2)     Status: None (Preliminary result)   Collection Time: 07/26/14 10:39 AM  Result Value Ref Range Status   Specimen Description BLOOD LEFT WRIST  Final   Special Requests BOTTLES DRAWN AEROBIC AND ANAEROBIC 3 CC EA  Final   Culture   Final           BLOOD CULTURE RECEIVED NO GROWTH TO DATE CULTURE WILL BE HELD FOR 5 DAYS BEFORE ISSUING A FINAL NEGATIVE REPORT Performed at [facility]    Report  Status PENDING  Incomplete  Urine culture     Status: None   Collection Time: 07/26/14 10:53 AM  Result Value Ref Range Status   Specimen Description URINE, CATHETERIZED  Final   Special Requests NONE  Final   Colony Count NO GROWTH Performed at [facility]   Final   Culture NO GROWTH Performed at [facility]   Final   Report Status 07/27/2014 FINAL  Final  MRSA PCR Screening     Status: Abnormal   Collection Time: 07/27/14 10:31 AM  Result Value Ref Range Status   MRSA by PCR POSITIVE (A) NEGATIVE Final    Comment:        The GeneXpert MRSA Assay (FDA approved for NASAL specimens only), is one component of a comprehensive MRSA colonization surveillance program. It is not intended to diagnose MRSA infection nor to guide or monitor treatment for MRSA infections. RESULT CALLED TO, READ BACK BY AND VERIFIED WITH: Ramond Craver [id number] @ 1335 BY J SCOTTON   Culture, Urine     Status: None   Collection Time: 07/28/14 11:33 AM  Result Value Ref Range Status   Specimen Description URINE, CATHETERIZED  Final   Special Requests NONE  Final   Colony Count NO GROWTH Performed at [facility]   Final   Culture NO GROWTH Performed at [facility]   Final   Report Status 07/29/2014 FINAL  Final     Studies: No results found.  Scheduled Meds: . antiseptic oral rinse  7 mL Mouth Rinse q12n4p  . aspirin  300 mg Rectal Daily  . B-complex with vitamin C  1 tablet Oral Q breakfast  . chlorhexidine  15 mL Mouth Rinse BID  .  Chlorhexidine Gluconate Cloth  6 each Topical Q0600  . dextrose  1 ampule Intravenous Once  . insulin aspart  10 Units Intravenous Once  . levothyroxine  68 mcg Intravenous Daily  . mupirocin ointment  1 application Nasal BID  . sodium chloride  3 mL Intravenous Q12H   Continuous Infusions: . dextrose 5 % 1,000 mL infusion 150 mL/hr at 07/29/14 1811    Principal Problem:   Acute encephalopathy Active Problems:   Hypernatremia   Drug-induced nephrogenic diabetes insipidus   Bipolar 1 disorder   Acute kidney injury   Dehydration   SIRS (systemic inflammatory response syndrome)   Essential hypertension   Dyslipidemia   Hypothyroidism   ARF (acute renal failure)   Leukocytosis    Time spent: 35 minutes    THOMPSON,DANIEL M.D. [facility] Pager [phone]. If 7PM-7AM, please contact night-coverage at [email or web address], password [facility] 07/31/2014, 1:51 PM  LOS: 5 days

## 2014-07-31 NOTE — Evaluation (Signed)
Physical Therapy Evaluation Patient Details Name: [name] [name] MRN: [id number] DOB: 09-17-1940 Today's Date: 07/31/2014   History of Present Illness   [name] [place]hoebe [place]ocks is a 74 y.o. female adm  07/26/14 with acute encephalopathy has been in skilled nursing facility since last adm;  history of hypertension, bipolar disorder, hypothyroidism, who was the in this hospital back in November and was treated for pneumonia  Clinical Impression  Pt admitted with above diagnosis. Pt currently with functional limitations due to the deficits listed below (see PT Problem List).  Pt will benefit from skilled PT to increase their independence and safety with mobility to allow discharge to the venue listed below.       Follow Up Recommendations SNF;Supervision/Assistance - 24 hour    Equipment Recommendations  None recommended by PT    Recommendations for Other Services       Precautions / Restrictions Precautions Precautions: Fall      Mobility  Bed Mobility Overal bed mobility: Needs Assistance;+2 for physical assistance Bed Mobility: Sit to Supine       Sit to supine: +2 for physical assistance;Total assist   General bed mobility comments: assist for trunk and LEs  Transfers Overall transfer level: Needs assistance Equipment used: Rolling walker (2 wheeled) Transfers: Sit to/from Stand Sit to Stand: +2 physical assistance;Total assist         General transfer comment: pt requries +2 for wt shift to stand, assist to move LEs once in standing; profoundly weak and able to assist very little, maintains knees in flexion throughout  Ambulation/Gait                Stairs            Wheelchair Mobility    Modified Rankin (Stroke Patients Only)       Balance Overall balance assessment: Needs assistance Sitting-balance support: Feet supported;No upper extremity supported Sitting balance-Leahy Scale: Zero       Standing balance-Leahy Scale: Zero                                Pertinent Vitals/Pain Pain Assessment: Faces Pain Score: 8  Faces Pain Scale: Hurts even more Pain Location: LEs Pain Intervention(s): Limited activity within patient's tolerance;Monitored during session;Repositioned    Home Living Family/patient expects to be discharged to:: Skilled nursing facility                      Prior Function                 Hand Dominance        Extremity/Trunk Assessment   Upper Extremity Assessment: Generalized weakness           Lower Extremity Assessment: RLE deficits/detail RLE Deficits / Details: bil LEs grossly 2+/5;        Communication   Communication: HOH  Cognition Arousal/Alertness: Awake/alert Behavior During Therapy: Flat affect   Area of Impairment: Attention;Following commands;Problem solving   Current Attention Level: Sustained   Following Commands: Follows one step commands inconsistently     Problem Solving: Slow processing;Decreased initiation;Requires verbal cues;Requires tactile cues      General Comments      Exercises        Assessment/Plan    PT Assessment Patient needs continued PT services  PT Diagnosis Difficulty walking;Generalized weakness   PT Problem List Decreased strength;Decreased mobility;Decreased balance;Decreased activity tolerance;Decreased safety awareness;Decreased knowledge of use of  DME  PT Treatment Interventions DME instruction;Gait training;Functional mobility training;Therapeutic activities;Patient/family education;Therapeutic exercise;Balance training   PT Goals (Current goals can be found in the Care Plan section) Acute Rehab PT Goals Patient Stated Goal: pt does not state PT Goal Formulation: Patient unable to participate in goal setting Time For Goal Achievement: 08/14/14 Potential to Achieve Goals: Fair    Frequency Min 3X/week   Barriers to discharge        Co-evaluation               End of Session Equipment  Utilized During Treatment: Gait belt Activity Tolerance: Patient limited by fatigue Patient left: in bed;with bed alarm set;with call bell/phone within reach;with nursing/sitter in room Nurse Communication: Mobility status         Time: 6962-9528 PT Time Calculation (min) (ACUTE ONLY): 15 min   Charges:   PT Evaluation $Initial PT Evaluation Tier I: 1 Procedure     PT G CodesDrucilla Chalet 08/11/14, 3:26 PM

## 2014-07-31 NOTE — Consult Note (Signed)
Psychiatry Consult follow-up note  Reason for Consult:  Bipolar medication management and s/p acute encephalopathy Referring Physician:  Dr. Janee Mornhompson  Patient Identification: [name] [name] MRN:  [id number] Principal Diagnosis: Acute encephalopathy Diagnosis:   Patient Active Problem List   Diagnosis Date Noted  . 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 ARF (acute renal failure) [N17.9]   . Leukocytosis [D72.829]   . Essential hypertension [I10] 07/26/2014  . Dyslipidemia [E78.5] 07/26/2014  . Hypothyroidism [E03.9] 07/26/2014  . SIRS (systemic inflammatory response syndrome) [A41.9] 06/17/2014  . Acute encephalopathy [G93.40] 06/16/2014  . Hypernatremia [E87.0] 06/16/2014  . Dehydration [E86.0] 06/16/2014  . Bipolar 1 disorder [F31.9] 04/26/2014  . Acute kidney injury [N17.9] 04/26/2014    Total Time spent with patient: 20 minutes  Subjective:   [name] [name] is a 74 y.o. female patient admitted with bipolar disorder.  HPI: [name] [name] is a 74 year old female seen and chart reviewed. Patient is currently suffering with acute metabolic encephalopathy and electrolyte abnormalities and not able to contribute to psychiatric evaluation. Case discussed with Dr. Janee Mornhompson and staff RN and recommended discontinue her medications like Tegretol which can cause electrolyte abnormalities and Cogentin which can cause dehydration. Patient medication can be reintroduced then she was medically cleared are started showing behavioral or emotional problems. Patient appeared lying in her bed with the open-mouth and sleeping without distress. She has a dry mouth and lips.   Interval history: Patient seen for psychiatric consultation follow-up and also case discussed with staff RN. Patient was sitting in a chair next to her bed and reportedly asking for food and complaining about increased appetite. Patient is more awake, alert and oriented to her name and being in hospital.  Patient continued to be confused, speech is slow and occasionally slurred. Patient is not a good historian. Patient seems to be hydrated better than yesterday and able to communicate with the staff. Patient has no dentures which makes it difficult to converse with her.  Medical history: Patient with history of bipolar disorder, hypertension, dyslipidemia, hypothyroidism, CKD stage 3, from nursing home who [facility] for evaluation of altered mental status. Patient at baseline talkative, able to communicate with others and last seen normal by family about 3 days prior to this admission. No reports of falls, no reports of respiratory distress, fevers, cough. No reports of vomiting. On admission, BP was stable at 124/68, HR 99, RR 35, Tmax 100.7 F and oxygen saturation of 92 % on room air.Blood work showed WBC count 13.2, sodium 157,chloride 120,creatinine 1.75 and calcium 11.2.CT head did not show acute intracranial findings. CXR showed no acute cardiopulmonary abnormalities. UA showed trace leukocytes. She was started on broad spectrum abx for treatment of possible sepsis.   Past Medical History:  Past Medical History  Diagnosis Date  . Hypertension   . Bipolar 1 disorder   . Hypothyroidism   . Memory difficulties   . HLD (hyperlipidemia)   . Diverticulitis large intestine   . Pneumonia     Past Surgical History  Procedure Laterality Date  . No past surgeries     Family History:  Family History  Problem Relation Age of Onset  . Heart disease Father    Social History:  History  Alcohol Use No     History  Drug Use No    History   Social History  . Marital Status: Single    Spouse Name: N/A  . Number of Children: N/A  . Years of Education:  N/A   Social History Main Topics  . Smoking status: Never Smoker   . Smokeless tobacco: Never Used  . Alcohol Use: No  . Drug Use: No  . Sexual Activity: No   Other Topics Concern  . None   Social History Narrative    Additional Social History:      Allergies:   Allergies  Allergen Reactions  . Antihistamine Decongestant [Triprolidine-Pse] Diarrhea  . Codeine Other (See Comments)    Gi upset     Vitals: Blood pressure 107/64, pulse 74, temperature 98.5 F (36.9 C), temperature source Oral, resp. rate 19, height  (1.676 m), weight 63.3 kg (139 lb 8.8 oz), SpO2 100 %.  Risk to Self: Is patient at risk for suicide?: No Risk to Others:   Prior Inpatient Therapy:   Prior Outpatient Therapy:    Current Facility-Administered Medications  Medication Dose Route Frequency Provider Last Rate Last Dose  . acetaminophen (TYLENOL) suppository 650 mg  650 mg Rectal Q4H PRN Ethelda Chick, MD   650 mg at 07/28/14 0104  . acetaminophen (TYLENOL) tablet 650 mg  650 mg Oral Q6H PRN Alison Murray, MD       Or  . acetaminophen (TYLENOL) suppository 650 mg  650 mg Rectal Q6H PRN Alison Murray, MD   650 mg at 07/28/14 2215  . albuterol (PROVENTIL) (2.5 MG/3ML) 0.083% nebulizer solution 2.5 mg  2.5 mg Nebulization Q4H PRN Alison Murray, MD      . antiseptic oral rinse (BIOTENE) solution 15 mL  15 mL Mouth Rinse PRN Alison Murray, MD      . antiseptic oral rinse (CPC / CETYLPYRIDINIUM CHLORIDE 0.05%) solution 7 mL  7 mL Mouth Rinse q12n4p Rodolph Bong, MD   7 mL at 07/30/14 1633  . aspirin suppository 300 mg  300 mg Rectal Daily Ramiro Harvest V, MD   300 mg at 07/31/14 1026  . B-complex with vitamin C tablet 1 tablet  1 tablet Oral Q breakfast Alison Murray, MD   1 tablet at 07/27/14 0800  . chlorhexidine (PERIDEX) 0.12 % solution 15 mL  15 mL Mouth Rinse BID Rodolph Bong, MD   15 mL at 07/30/14 2000  . Chlorhexidine Gluconate Cloth 2 % PADS 6 each  6 each Topical Q0600 Marinda Elk, MD   6 each at 07/31/14 [phone]  . dextrose 5 % 1,000 mL infusion   Intravenous Continuous Rodolph Bong, MD 150 mL/hr at 07/29/14 1811    . dextrose 50 % solution 50 mL  1 ampule Intravenous Once Rodolph Bong, MD   50 mL at 07/29/14 1811  . guaiFENesin-dextromethorphan (ROBITUSSIN DM) 100-10 MG/5ML syrup 5 mL  5 mL Oral Q4H PRN Alison Murray, MD      . insulin aspart (novoLOG) injection 10 Units  10 Units Intravenous Once Rodolph Bong, MD   10 Units at 07/29/14 1812  . levothyroxine (SYNTHROID, LEVOTHROID) injection 68 mcg  68 mcg Intravenous Daily Rodolph Bong, MD   68 mcg at 07/31/14 1027  . mupirocin ointment (BACTROBAN) 2 % 1 application  1 application Nasal BID Marinda Elk, MD   1 application at 07/31/14 [phone]  . ondansetron (ZOFRAN) tablet 4 mg  4 mg Oral Q6H PRN Alison Murray, MD       Or  . ondansetron [facility]) injection 4 mg  4 mg Intravenous Q6H PRN Alison Murray, MD      .  phenol (CHLORASEPTIC) mouth spray 2 spray  2 spray Mouth/Throat Q2H PRN Alison Murray, MD      . sodium chloride 0.9 % injection 3 mL  3 mL Intravenous Q12H Alison Murray, MD   3 mL at 07/30/14 2131    Musculoskeletal: Strength & Muscle Tone: decreased Gait & Station: unable to stand Patient leans: N/A  Psychiatric Specialty Exam: Physical Exam as per the history and physical   ROS dehydration and history of bipolar disorder, currently noncommunicative   Blood pressure 107/64, pulse 74, temperature 98.5 F (36.9 C), temperature source Oral, resp. rate 19, height  (1.676 m), weight 63.3 kg (139 lb 8.8 oz), SpO2 100 %.Body mass index is 22.53 kg/(m^2).  General Appearance: Disheveled  Eye Contact::  Poor  Speech:  Slow and Slurred  Volume:  Decreased  Mood:  Anxious and Depressed  Affect:  Constricted  Thought Process:  Irrelevant and Tangential  Orientation:  Full (Time, Place, and Person)  Thought Content:  Rumination  Suicidal Thoughts:  No  Homicidal Thoughts:  No  Memory:  Immediate;   Poor Recent;   Poor  Judgement:  Fair  Insight:  Shallow  Psychomotor Activity:  Psychomotor Retardation  Concentration:  Poor  Recall:  Poor  Fund of Knowledge:Fair  Language: Fair   Akathisia:  Negative  Handed:  Right  AIMS (if indicated):     Assets:  Communication Skills Desire for Improvement Housing Intimacy Resilience Social Support  ADL's:  Impaired  Cognition: Impaired,  Moderate  Sleep:      Medical Decision Making: New problem, with additional work up planned, Review of Psycho-Social Stressors (1), Review or order clinical lab tests (1), Established Problem, Worsening (2), Review or order medicine tests (1), Review of Medication Regimen & Side Effects (2) and Review of New Medication or Change in Dosage (2)  Treatment Plan Summary: Daily contact with patient to assess and evaluate symptoms and progress in treatment and Medication management  Plan: Discontinue carbamazepine and benztropine Patient does not meet criteria for psychiatric inpatient admission. Supportive therapy provided about ongoing stressors.  Appreciate psychiatric consultation and follow up as clinically required Please contact [phone] or [phone] if needs further assistance  Disposition: Patient can be discharged to her skilled nursing facility when she is medically stable.  Suzane Vanderweide,JANARDHAHA R. 07/31/2014 2:42 PM

## 2014-08-01 LAB — BASIC METABOLIC PANEL
ANION GAP: 5 (ref 5–15)
ANION GAP: 3 — AB (ref 5–15)
BUN: 12 mg/dL (ref 6–23)
BUN: 11 mg/dL (ref 6–23)
CALCIUM: 9 mg/dL (ref 8.4–10.5)
CO2: 24 mmol/L (ref 19–32)
CO2: 27 mmol/L (ref 19–32)
CREATININE: 0.99 mg/dL (ref 0.50–1.10)
Calcium: 9.2 mg/dL (ref 8.4–10.5)
Chloride: 119 mmol/L — ABNORMAL HIGH (ref 96–112)
Chloride: 116 mmol/L — ABNORMAL HIGH (ref 96–112)
Creatinine, Ser: 0.88 mg/dL (ref 0.50–1.10)
GFR calc Af Amer: 63 mL/min — ABNORMAL LOW (ref >90)
GFR calc non Af Amer: 55 mL/min — ABNORMAL LOW (ref >90)
GFR calc non Af Amer: 63 mL/min — ABNORMAL LOW (ref >90)
GFR, EST AFRICAN AMERICAN: 73 mL/min — AB (ref >90)
Glucose, Bld: 117 mg/dL — ABNORMAL HIGH (ref 70–99)
Glucose, Bld: 154 mg/dL — ABNORMAL HIGH (ref 70–99)
POTASSIUM: 3.5 mmol/L (ref 3.5–5.1)
Potassium: 3.5 mmol/L (ref 3.5–5.1)
SODIUM: 146 mmol/L — AB (ref 135–145)
Sodium: 148 mmol/L — ABNORMAL HIGH (ref 135–145)

## 2014-08-01 LAB — CBC
HEMATOCRIT: 35.5 % — AB (ref 36.0–46.0)
HEMOGLOBIN: 10.8 g/dL — AB (ref 12.0–15.0)
MCH: 30 pg (ref 26.0–34.0)
MCHC: 30.4 g/dL (ref 30.0–36.0)
MCV: 98.6 fL (ref 78.0–100.0)
Platelets: 161 10*3/uL (ref 150–400)
RBC: 3.6 MIL/uL — ABNORMAL LOW (ref 3.87–5.11)
RDW: 12.7 % (ref 11.5–15.5)
WBC: 8.2 10*3/uL (ref 4.0–10.5)

## 2014-08-01 LAB — CULTURE, BLOOD (ROUTINE X 2)
CULTURE: NO GROWTH
Culture: NO GROWTH

## 2014-08-01 LAB — GLUCOSE, CAPILLARY: Glucose-Capillary: 111 mg/dL — ABNORMAL HIGH (ref 70–99)

## 2014-08-01 MED ORDER — DEXTROSE 5 % IV SOLN
INTRAVENOUS | Status: DC
  Administered 2014-08-01 – 2014-08-02 (×2): via INTRAVENOUS

## 2014-08-01 MED ORDER — HYDROCHLOROTHIAZIDE 25 MG PO TABS
25.0000 mg | ORAL_TABLET | Freq: Every day | ORAL | Status: DC
  Administered 2014-08-01 – 2014-08-03 (×3): 25 mg via ORAL
  Filled 2014-08-01 (×4): qty 1

## 2014-08-01 MED ORDER — POTASSIUM CHLORIDE CRYS ER 20 MEQ PO TBCR
40.0000 meq | EXTENDED_RELEASE_TABLET | Freq: Once | ORAL | Status: AC
  Administered 2014-08-01: 40 meq via ORAL
  Filled 2014-08-01: qty 2

## 2014-08-01 MED ORDER — POTASSIUM CHLORIDE CRYS ER 20 MEQ PO TBCR: 40.0000 meq | EXTENDED_RELEASE_TABLET | Freq: Once | ORAL | Status: DC

## 2014-08-01 NOTE — Progress Notes (Signed)
[facility] PROGRESS NOTE  [name] [name] GNF:[id number] DOB: 05-05-1941 DOA: 07/26/2014 PCP: Astrid Divine, MD  Assessment/Plan: #1 acute encephalopathy Likely secondary to hypernatremia in the setting of acute renal failure. Clinical improvement.  Chest x-ray was negative. CT head negative. Urinalysis was nitrite negative leukocytes negative. Urine cultures have been negative. Blood cultures pending with no growth to date. Patient's sodium level this morning is 148 from 155 from 167. Continue D5W. Serial basic metabolic profiles. Once hypernatremia resolves will not resume lithium again. Follow.  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 Renal function and sodium levels improving. Will start thiazide  daily. Will need to be on low salt/low protein diet, and need weekly BMET. Will need renal outpatient follow up. No more lithium on d/c.  #3 hypernatremia Likely secondary to dehydration and DI from lithium. Continue D5W. Serial BMETs.  #4 acute renal failure Likely prerenal azotemia and ATN as patient does look dehydrated and noted to be hypernatremic and was on Lithium PTA. Baseline creatinine 1-1.2. Renal ultrasound negative for hydronephrosis. Continue IV fluids. Renal function trending down. Will need weekly BMET and probable outpatient follow up with renal. Follow.  #5 leukocytosis Questionable etiology. Patient is currently afebrile. Patient had been on IV Vanco and Zosyn and currently on IV Rocephin. No source of infection noted. Urine cultures are negative. Blood cultures pending today. Antibiotic have been discontinued. WBC improved. Follow.   #6 hypertension Stable. Patient not taking oral medications at this point in time. Will follow for now.  #7 dyslipidemia When tolerating oral intake will resume statin.  #8 hypothyroidism TSH was decreased. Decreased Synthroid. Will need repeat thyroid function studies done in about 4-6 weeks.  #9 bipolar 1  disorder Will continue to hold lithium and Tegretol and Cogentin   for now as patient is acute renal failure and lithium may accumulate. Lithium should not be resumed as likely lead to nephrogenic DI.  Follow for now.   Code Status: Full Family Communication: No family at bedside. Disposition Plan: Remain inpatient.   Consultants:  None  Procedures:  CT head 07/26/2014  Chest x-ray 07/26/2014  Renal ultrasound 07/28/2014  Antibiotics:  IV Zosyn 07/26/2014>>> 07/27/2014  IV vancomycin to 22 2016>>> 07/27/2014  IV Rocephin 07/27/2014>>>> 07/28/2014  HPI/Subjective: Patient opens eyes to verbal stimuli. Patient ff commands and interacting.   Objective: Filed Vitals:   08/01/14 0603  BP: 116/61  Pulse: 73  Temp: 98.1 F (36.7 C)  Resp: 18    Intake/Output Summary (Last 24 hours) at 08/01/14 1133 Last data filed at 08/01/14 0830  Gross per 24 hour  Intake 3345.83 ml  Output   3450 ml  Net -104.17 ml   Filed Weights   07/30/14 0440 07/31/14 0448 08/01/14 0604  Weight: 61.4 kg (135 lb 5.8 oz) 63.3 kg (139 lb 8.8 oz) 61.2 kg (134 lb 14.7 oz)    Exam:   General:  NAD. Opens his eyes to verbal stimuli. Following commands. Extremely dry mucous membranes.  Cardiovscular: Regular rate rhythm no murmurs rubs or gallops.  Respiratory: Clear auscultation bilaterally anterior lung fields.  Abdomen: Obese, soft, nontender, nondistended, positive bowel sounds.  Musculoskeletal: No clubbing cyanosis or edema.  Data Reviewed: Basic Metabolic Panel:  Recent Labs Lab 07/26/14 1915  07/28/14 0550  07/29/14 0900  07/30/14 0615 07/30/14 0950 07/30/14 1914 07/31/14 0555 08/01/14 0541  NA 156*  < > 167*  < > 163*  < > 154* 152* 143 148* 148*  K 3.9  < > 4.1  < >  3.9  < > 3.9 3.7 3.3* 3.5 3.5  CL 125*  < > >130*  < > >130*  < > 124* 124* 115* 120* 119*  CO2 26  < > 24  < > 22  < > 25 25 25 25 24   GLUCOSE 107*  < > 160*  < > 125*  < > 153* 153* 94 128* 117*  BUN  19  < > 30*  < > 30*  < > 23 23 19 16 12   CREATININE 1.61*  < > 2.02*  < > 1.49*  < > 1.39* 1.30* 0.99 1.04 0.99  CALCIUM 9.9  < > 10.9*  < > 10.3  < > 9.8 9.7 9.0 9.4 9.2  MG 2.3  --  2.7*  --  2.3  --   --   --   --  2.0  --   PHOS 4.1  --   --   --   --   --   --   --   --   --   --   < > = values in this interval not displayed. Liver Function Tests:  Recent Labs Lab 07/26/14 1033 07/26/14 1915 07/28/14 0550  AST 38* 43* 29  ALT 38* 42* 34  ALKPHOS 119* 94 101  BILITOT 0.7 0.6 0.2*  PROT 7.3 5.9* 7.1  ALBUMIN 3.9 3.1* 3.6   No results for input(s): LIPASE, AMYLASE in the last 168 hours. No results for input(s): AMMONIA in the last 168 hours. CBC:  Recent Labs Lab 07/26/14 1033 07/28/14 0550 07/29/14 0505 07/30/14 0615 08/01/14 0541  WBC 13.2* 17.2* 10.0 8.5 8.2  NEUTROABS 11.2* 14.6* 7.8*  --   --   HGB 14.3 14.3 12.3 11.2* 10.8*  HCT 48.3* 50.5* 43.5 38.8 35.5*  MCV 101.5* 106.3* 105.8* 103.2* 98.6  PLT 332 212 154 129* 161   Cardiac Enzymes: No results for input(s): CKTOTAL, CKMB, CKMBINDEX, TROPONINI in the last 168 hours. BNP (last 3 results) No results for input(s): BNP in the last 8760 hours.  ProBNP (last 3 results) No results for input(s): PROBNP in the last 8760 hours.  CBG:  Recent Labs Lab 07/28/14 0745 07/29/14 0738 07/30/14 0802 07/31/14 0736 08/01/14 0749  GLUCAP 146* 113* 140* 121* 111*    Recent Results (from the past 240 hour(s))  Blood culture (routine x 2)     Status: None   Collection Time: 07/26/14 10:33 AM  Result Value Ref Range Status   Specimen Description BLOOD LEFT HAND  Final   Special Requests BOTTLES DRAWN AEROBIC AND ANAEROBIC 5 CC EACH  Final   Culture   Final    NO GROWTH 5 DAYS [facility]formed at [facility]    Report Status 08/01/2014 FINAL  Final  Blood culture (routine x 2)     Status: None   Collection Time: 07/26/14 10:39 AM  Result Value Ref Range Status   Specimen Description BLOOD LEFT WRIST  Final    Special Requests BOTTLES DRAWN AEROBIC AND ANAEROBIC 3 CC EA  Final   Culture   Final    NO GROWTH 5 DAYS Performed at [facility]    Report Status 08/01/2014 FINAL  Final  Urine culture     Status: None   Collection Time: 07/26/14 10:53 AM  Result Value Ref Range Status   Specimen Description URINE, CATHETERIZED  Final   Special Requests NONE  Final   Colony Count NO GROWTH Performed at [facility]  Final   Culture NO GROWTH Performed at [facility]   Final   Report Status 07/27/2014 FINAL  Final  MRSA PCR Screening     Status: Abnormal   Collection Time: 07/27/14 10:31 AM  Result Value Ref Range Status   MRSA by PCR POSITIVE (A) NEGATIVE Final    Comment:        The GeneXpert MRSA Assay (FDA approved for NASAL specimens only), is one component of a comprehensive MRSA colonization surveillance program. It is not intended to diagnose MRSA infection nor to guide or monitor treatment for MRSA infections. RESULT CALLED TO, READ BACK BY AND VERIFIED WITH: Ramond Craver [id number] @ 1335 BY J SCOTTON   Culture, Urine     Status: None   Collection Time: 07/28/14 11:33 AM  Result Value Ref Range Status   Specimen Description URINE, CATHETERIZED  Final   Special Requests NONE  Final   Colony Count NO GROWTH Performed at [facility]   Final   Culture NO GROWTH Performed at [facility]   Final   Report Status 07/29/2014 FINAL  Final     Studies: Dg Swallowing Func-speech Pathology  07/31/2014   Breck Coons [place], CCC-SLP     07/31/2014  4:48 PM  Objective Swallowing Evaluation: Modified Barium Swallowing Study   Patient Details  Name: [name] [name] MRN: [id number] Date of Birth: 27-Jul-1940  Today's Date: 07/31/2014 Time: SLP Start Time (ACUTE ONLY): 1600-SLP Stop Time (ACUTE  ONLY): 1620 SLP Time Calculation (min) (ACUTE ONLY): 20 min  Past Medical History:  Past Medical History  Diagnosis Date  . Hypertension   . Bipolar 1  disorder   . Hypothyroidism   . Memory difficulties   . HLD (hyperlipidemia)   . Diverticulitis large intestine   . Pneumonia    Past Surgical History:  Past Surgical History  Procedure Laterality Date  . No past surgeries     HPI:  HPI: 74 year old female with history of bipolar disorder,  hypertension, dyslipidemia, pna, hypothyroidism, CKD stage 3,  from nursing home admitted with altered mental status. CT 2/22 No  acute intracranial abnormality. CXR No acute cardiopulmonary  abnormality seen. MBS 06/23/14 with penetration and trace silent  aspiration iwth thin and decreased esophageal clearance, nectar  thick liquids and sugessted MD may wish to continue liquids only;  not certain if pt was started on solids. MBS recommened to  objectively assess current swallow function.  No Data Recorded  Assessment / Plan / Recommendation [facility]L IP CLINICAL IMPRESSIONS 07/31/2014  Dysphagia Diagnosis Moderate oral phase dysphagia;Moderate  pharyngeal phase dysphagia  Clinical impression Pt demonstrated moderate oral dysphagia  characterized by motor weakness and delayed transit.  Oropharyngeal phase deficits with decreased tongue base  retraction resulting in moderate amounts of residue posterior  tongue without consistent sensation. Verbal cue for 2 swallows  assisted in mild-moderate clearance. Pharyngeal phase described  with sensorimotor impairments leading to delayed swallow  initiation with nectar to valleculae and mild vallecular residue.  Silent penetration observed with large sip nectar barium. Honey  thick resulted in increased base of tongue residue and required 3  swallows to decrease. Pt masticated Dys 2 texture (graham cracker  in applesauce) with functional manipulation and tranist.  Recommend Dys 2 texture and nectar thick liquids, no straws,  pills whole in applesauce (if able) and full assistance for  compensatory strategies. Pt stated "I can't eat puree food. It  looks nasty and makes me throw up." Continued ST.  [facility] IP TREATMENT RECOMMENDATION 07/31/2014  Treatment Plan Recommendations Therapy as outlined in treatment  plan below     [facility] IP DIET RECOMMENDATION 07/31/2014  Diet Recommendations Dysphagia 2 (Fine chop);Nectar-thick liquid  Liquid Administration via Cup;No straw  Medication Administration Whole meds with puree  Compensations Slow rate;Small sips/bites;Check for  pocketing;Multiple dry swallows after each bite/sip  Postural Changes and/or Swallow Maneuvers Seated upright 90  degrees;Upright 30-60 min after meal     CHL IP OTHER RECOMMENDATIONS 07/31/2014  Recommended Consults (None)  Oral Care Recommendations Oral care Q4 per protocol  Other Recommendations (None)     [facility] IP FOLLOW UP RECOMMENDATIONS 07/31/2014  Follow up Recommendations Skilled Nursing facility     [facility] IP FREQUENCY AND DURATION 07/31/2014  Speech Therapy Frequency (ACUTE ONLY) min 2x/week  Treatment Duration 2 weeks     Pertinent Vitals/Pain none     No flowsheet data found.  No flowsheet data found.    CHL IP REASON FOR REFERRAL 07/31/2014  Reason for Referral Objectively evaluate swallowing function     CH[facility] IP ORAL PHASE 07/31/2014  Lips (None)  Tongue (None)  Mucous membranes (None)  Nutritional status (None)  Other (None)  Oxygen therapy (None)  Oral Phase Impaired  Oral - Pudding Teaspoon (None)  Oral - Pudding Cup (None)  Oral - Honey Teaspoon Lingual/palatal residue;Delayed oral  transit;Right anterior bolus loss  Oral - Honey Cup Lingual/palatal residue;Delayed oral  transit;Right anterior bolus loss  Oral - Honey Syringe (None)  Oral - Nectar Teaspoon Lingual/palatal residue;Delayed oral  transit;Right anterior bolus loss  Oral - Nectar Cup Lingual/palatal residue;Delayed oral  transit;Right anterior bolus loss  Oral - Nectar Straw (None)  Oral - Nectar Syringe (None)  Oral - Ice Chips (None)  Oral - Thin Teaspoon (None)  Oral - Thin Cup (None)  Oral - Thin Straw (None)  Oral - Thin Syringe (None)  Oral - Puree Lingual/palatal  residue;Delayed oral transit  Oral - Mechanical Soft Delayed oral transit  Oral - Regular (None)  Oral - Multi-consistency (None)  Oral - Pill (None)  Oral Phase - Comment (None)      CHL IP PHARYNGEAL PHASE 07/31/2014  Pharyngeal Phase Impaired  Pharyngeal - Pudding Teaspoon (None)  Penetration/Aspiration details (pudding teaspoon) (None)  Pharyngeal - Pudding Cup (None)  Penetration/Aspiration details (pudding cup) (None)  Pharyngeal - Honey Teaspoon Pharyngeal residue -  valleculae;Reduced tongue base retraction  Penetration/Aspiration details (honey teaspoon) (None)  Pharyngeal - Honey Cup Pharyngeal residue - valleculae;Reduced  tongue base retraction  Penetration/Aspiration details (honey cup) (None)  Pharyngeal - Honey Syringe (None)  Penetration/Aspiration details (honey syringe) (None)  Pharyngeal - Nectar Teaspoon Delayed swallow initiation;Premature  spillage to valleculae;Reduced tongue base retraction  Penetration/Aspiration details (nectar teaspoon) (None)  Pharyngeal - Nectar Cup Delayed swallow initiation;Premature  spillage to valleculae;Reduced tongue base  retraction;Penetration/Aspiration during swallow;Pharyngeal  residue - valleculae  Penetration/Aspiration details (nectar cup) Material enters  airway, remains ABOVE vocal cords and not ejected out  Pharyngeal - Nectar Straw (None)  Penetration/Aspiration details (nectar straw) (None)  Pharyngeal - Nectar Syringe (None)  Penetration/Aspiration details (nectar syringe) (None)  Pharyngeal - Ice Chips (None)  Penetration/Aspiration details (ice chips) (None)  Pharyngeal - Thin Teaspoon (None)  Penetration/Aspiration details (thin teaspoon) (None)  Pharyngeal - Thin Cup (None)  Penetration/Aspiration details (thin cup) (None)  Pharyngeal - Thin Straw (None)  Penetration/Aspiration details (thin straw) (None)  Pharyngeal - Thin Syringe (None)  Penetration/Aspiration details (thin syringe') (None)  Pharyngeal - Puree Pharyngeal residue -  valleculae;Reduced tongue  base retraction  Penetration/Aspiration details (puree) (None)  Pharyngeal - Mechanical Soft (None)  Penetration/Aspiration details (mechanical soft) (None)  Pharyngeal - Regular (None)  Penetration/Aspiration details (regular) (None)  Pharyngeal - Multi-consistency (None)  Penetration/Aspiration details (multi-consistency) (None)  Pharyngeal - Pill (None)  Penetration/Aspiration details (pill) (None)  Pharyngeal Comment (None)     CHL IP CERVICAL ESOPHAGEAL PHASE 07/31/2014  Cervical Esophageal Phase WFL  Pudding Teaspoon (None)  Pudding Cup (None)  Honey Teaspoon (None)  Honey Cup (None) Honey Syringe (None)  Nectar Teaspoon (None)  Nectar Cup (None)  Nectar Straw (None)  Nectar Syringe (None)  Thin Teaspoon (None)  Thin Cup (None)  Thin Straw (None)  Thin Syringe (None)  Cervical Esophageal Comment (None)    No flowsheet data found.         Royce Macadamia 07/31/2014, 4:47 PM  Breck Coons [place] Face.Ed CCC-SLP Pager [phone]      Scheduled Meds: . antiseptic oral rinse  7 mL Mouth Rinse q12n4p  . aspirin  300 mg Rectal Daily  . B-complex with vitamin C  1 tablet Oral Q breakfast  . chlorhexidine  15 mL Mouth Rinse BID  . Chlorhexidine Gluconate Cloth  6 each Topical Q0600  . dextrose  1 ampule Intravenous Once  . hydrochlorothiazide  25 mg Oral Daily  . insulin aspart  10 Units Intravenous Once  . levothyroxine  68 mcg Intravenous Daily  . sodium chloride  3 mL Intravenous Q12H   Continuous Infusions: . dextrose 150 mL/hr at 08/01/14 1126    Principal Problem:   Acute encephalopathy Active Problems:   Hypernatremia   Drug-induced nephrogenic diabetes insipidus   Bipolar 1 disorder   Acute kidney injury   Dehydration   SIRS (systemic inflammatory response syndrome)   Essential hypertension   Dyslipidemia   Hypothyroidism   ARF (acute renal failure)   Leukocytosis    Time spent: 35 minutes    Majesty Oehlert M.D. [facility] Pager  [phone]. If 7PM-7AM, please contact night-coverage at [email or web address], password [facility] 08/01/2014, 11:33 AM  LOS: 6 days

## 2014-08-02 LAB — BASIC METABOLIC PANEL
Anion gap: 5 (ref 5–15)
BUN: 9 mg/dL (ref 6–23)
CO2: 27 mmol/L (ref 19–32)
Calcium: 9.3 mg/dL (ref 8.4–10.5)
Chloride: 113 mmol/L — ABNORMAL HIGH (ref 96–112)
Creatinine, Ser: 0.94 mg/dL (ref 0.50–1.10)
GFR calc Af Amer: 68 mL/min — ABNORMAL LOW (ref >90)
GFR, EST NON AFRICAN AMERICAN: 58 mL/min — AB (ref >90)
GLUCOSE: 176 mg/dL — AB (ref 70–99)
POTASSIUM: 3.2 mmol/L — AB (ref 3.5–5.1)
SODIUM: 145 mmol/L (ref 135–145)

## 2014-08-02 LAB — CBC
HCT: 35.5 % — ABNORMAL LOW (ref 36.0–46.0)
HEMOGLOBIN: 10.8 g/dL — AB (ref 12.0–15.0)
MCH: 29.8 pg (ref 26.0–34.0)
MCHC: 30.4 g/dL (ref 30.0–36.0)
MCV: 98.1 fL (ref 78.0–100.0)
Platelets: 180 10*3/uL (ref 150–400)
RBC: 3.62 MIL/uL — ABNORMAL LOW (ref 3.87–5.11)
RDW: 12.9 % (ref 11.5–15.5)
WBC: 8.7 10*3/uL (ref 4.0–10.5)

## 2014-08-02 LAB — GLUCOSE, CAPILLARY: GLUCOSE-CAPILLARY: 104 mg/dL — AB (ref 70–99)

## 2014-08-02 LAB — MAGNESIUM: Magnesium: 1.8 mg/dL (ref 1.5–2.5)

## 2014-08-02 MED ORDER — LEVOTHYROXINE SODIUM 137 MCG PO TABS
137.0000 ug | ORAL_TABLET | Freq: Every day | ORAL | Status: DC
  Administered 2014-08-03 – 2014-08-06 (×4): 137 ug via ORAL
  Filled 2014-08-02 (×5): qty 1

## 2014-08-02 MED ORDER — LAMOTRIGINE 25 MG PO TABS
25.0000 mg | ORAL_TABLET | Freq: Two times a day (BID) | ORAL | Status: DC
  Administered 2014-08-02 – 2014-08-04 (×4): 25 mg via ORAL
  Filled 2014-08-02 (×6): qty 1

## 2014-08-02 MED ORDER — POTASSIUM CHLORIDE CRYS ER 20 MEQ PO TBCR
40.0000 meq | EXTENDED_RELEASE_TABLET | ORAL | Status: AC
  Administered 2014-08-02 (×2): 40 meq via ORAL
  Filled 2014-08-02 (×2): qty 2

## 2014-08-02 MED ORDER — ASPIRIN 81 MG PO CHEW
81.0000 mg | CHEWABLE_TABLET | Freq: Every day | ORAL | Status: DC
  Administered 2014-08-02 – 2014-08-06 (×5): 81 mg via ORAL
  Filled 2014-08-02 (×5): qty 1

## 2014-08-02 NOTE — Progress Notes (Signed)
[facility] PROGRESS NOTE  [name] Arhoebe Pirani UEA:[id number] DOB: Oct 15, 1940 DOA: 07/26/2014 PCP: Astrid DivineGRIFFIN,ELAINE COLLINS, MD  Assessment/Plan: #1 acute encephalopathy Likely secondary to hypernatremia in the setting of acute renal failure. Clinical improvement.  Chest x-ray was negative. CT head negative. Urinalysis was nitrite negative leukocytes negative. Urine cultures have been negative. Blood cultures pending with no growth to date. Patient's sodium level this morning is 145 from 148 from 155 from 167. NSL IVF. Serial basic metabolic profiles. Will not resume lithium again. Follow.  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 Renal function and sodium levels improving. Continue thiazide 25mg  daily. Continue low salt/low protein diet, and need weekly BMET. Will need renal outpatient follow up. No more lithium on d/c.  #3 hypernatremia Likely secondary to dehydration and DI from lithium. Continue D5W. Serial BMETs.  #4 acute renal failure Likely prerenal azotemia and ATN as patient does look dehydrated and noted to be hypernatremic and was on Lithium PTA. Baseline creatinine 1-1.2. Renal ultrasound negative for hydronephrosis. Continue IV fluids. Renal function trending down. Will need weekly BMET and probable outpatient follow up with renal. Follow.  #5 leukocytosis Questionable etiology. Patient is currently afebrile. Patient had been on IV Vanco and Zosyn and currently on IV Rocephin. No source of infection noted. Urine cultures are negative. Blood cultures pending today. Antibiotic have been discontinued. WBC improved. Follow.   #6 hypertension Stable. Patient not taking oral medications at this point in time. Will follow for now.  #7 dyslipidemia When tolerating oral intake will resume statin.  #8 hypothyroidism TSH was decreased. Decreased Synthroid. Will need repeat thyroid function studies done in about 4-6 weeks.  #9 bipolar 1 disorder Will continue to hold lithium  and Tegretol and Cogentin   for now as patient is acute renal failure and lithium may accumulate. Lithium should not be resumed as likely lead to nephrogenic DI.  Patient started on lamictal per psychiatry. Follow for now.   Code Status: Full Family Communication: No family at bedside. Disposition Plan: Remain inpatient.   Consultants:  None  Procedures:  CT head 07/26/2014  Chest x-ray 07/26/2014  Renal ultrasound 07/28/2014  Antibiotics:  IV Zosyn 07/26/2014>>> 07/27/2014  IV vancomycin to 22 2016>>> 07/27/2014  IV Rocephin 07/27/2014>>>> 07/28/2014  HPI/Subjective: Patient opens eyes to verbal stimuli. Patient ff commands and interacting.   Objective: Filed Vitals:   08/02/14 0605  BP: 134/68  Pulse: 82  Temp: 97.7 F (36.5 C)  Resp: 18    Intake/Output Summary (Last 24 hours) at 08/02/14 1324 Last data filed at 08/02/14 1230  Gross per 24 hour  Intake 4258.5 ml  Output   2825 ml  Net 1433.5 ml   Filed Weights   07/31/14 0448 08/01/14 0604 08/02/14 0605  Weight: 63.3 kg (139 lb 8.8 oz) 61.2 kg (134 lb 14.7 oz) 63.3 kg (139 lb 8.8 oz)    Exam:   General:  NAD. Opens his eyes to verbal stimuli. Following commands. Extremely dry mucous membranes.  Cardiovscular: Regular rate rhythm no murmurs rubs or gallops.  Respiratory: Clear auscultation bilaterally anterior lung fields.  Abdomen: Obese, soft, nontender, nondistended, positive bowel sounds.  Musculoskeletal: No clubbing cyanosis or edema.  Data Reviewed: Basic Metabolic Panel:  Recent Labs Lab 07/26/14 1915  07/28/14 0550  07/29/14 0900  07/30/14 1914 07/31/14 0555 08/01/14 0541 08/01/14 1420 08/02/14 0552  NA 156*  < > 167*  < > 163*  < > 143 148* 148* 146* 145  K 3.9  < > 4.1  < >  3.9  < > 3.3* 3.5 3.5 3.5 3.2*  CL 125*  < > >130*  < > >130*  < > 115* 120* 119* 116* 113*  CO2 26  < > 24  < > 22  < > 25 25 24 27 27   GLUCOSE 107*  < > 160*  < > 125*  < > 94 128* 117* 154* 176*   BUN 19  < > 30*  < > 30*  < > 19 16 12 11 9   CREATININE 1.61*  < > 2.02*  < > 1.49*  < > 0.99 1.04 0.99 0.88 0.94  CALCIUM 9.9  < > 10.9*  < > 10.3  < > 9.0 9.4 9.2 9.0 9.3  MG 2.3  --  2.7*  --  2.3  --   --  2.0  --   --  1.8  PHOS 4.1  --   --   --   --   --   --   --   --   --   --   < > = values in this interval not displayed. Liver Function Tests:  Recent Labs Lab 07/26/14 1915 07/28/14 0550  AST 43* 29  ALT 42* 34  ALKPHOS 94 101  BILITOT 0.6 0.2*  PROT 5.9* 7.1  ALBUMIN 3.1* 3.6   No results for input(s): LIPASE, AMYLASE in the last 168 hours. No results for input(s): AMMONIA in the last 168 hours. CBC:  Recent Labs Lab 07/28/14 0550 07/29/14 0505 07/30/14 0615 08/01/14 0541 08/02/14 0552  WBC 17.2* 10.0 8.5 8.2 8.7  NEUTROABS 14.6* 7.8*  --   --   --   HGB 14.3 12.3 11.2* 10.8* 10.8*  HCT 50.5* 43.5 38.8 35.5* 35.5*  MCV 106.3* 105.8* 103.2* 98.6 98.1  PLT 212 154 129* 161 180   Cardiac Enzymes: No results for input(s): CKTOTAL, CKMB, CKMBINDEX, TROPONINI in the last 168 hours. BNP (last 3 results) No results for input(s): BNP in the last 8760 hours.  ProBNP (last 3 results) No results for input(s): PROBNP in the last 8760 hours.  CBG:  Recent Labs Lab 07/29/14 0738 07/30/14 0802 07/31/14 0736 08/01/14 0749 08/02/14 0811  GLUCAP 113* 140* 121* 111* 104*    Recent Results (from the past 240 hour(s))  Blood culture (routine x 2)     Status: None   Collection Time: 07/26/14 10:33 AM  Result Value Ref Range Status   Specimen Description BLOOD LEFT HAND  Final   Special Requests BOTTLES DRAWN AEROBIC AND ANAEROBIC 5 CC EACH  Final   Culture   Final    NO GROWTH 5 DAYS Performed at [facility]    Report Status 08/01/2014 FINAL  Final  Blood culture (routine x 2)     Status: None   Collection Time: 07/26/14 10:39 AM  Result Value Ref Range Status   Specimen Description BLOOD LEFT WRIST  Final   Special Requests BOTTLES DRAWN AEROBIC  AND ANAEROBIC 3 CC EA  Final   Culture   Final    NO GROWTH 5 DAYS Performed at [facility]    Report Status 08/01/2014 FINAL  Final  Urine culture     Status: None   Collection Time: 07/26/14 10:53 AM  Result Value Ref Range Status   Specimen Description URINE, CATHETERIZED  Final   Special Requests NONE  Final   Colony Count NO GROWTH Performed at [facility]   Final   Culture NO GROWTH Performed  at [facility]   Final   Report Status 07/27/2014 FINAL  Final  MRSA PCR Screening     Status: Abnormal   Collection Time: 07/27/14 10:31 AM  Result Value Ref Range Status   MRSA by PCR POSITIVE (A) NEGATIVE Final    Comment:        The GeneXpert MRSA Assay (FDA approved for NASAL specimens only), is one component of a comprehensive MRSA colonization surveillance program. It is not intended to diagnose MRSA infection nor to guide or monitor treatment for MRSA infections. RESULT CALLED TO, READ BACK BY AND VERIFIED WITH: Ramond Craver [id number] @ 1335 BY J SCOTTON   Culture, Urine     Status: None   Collection Time: 07/28/14 11:33 AM  Result Value Ref Range Status   Specimen Description URINE, CATHETERIZED  Final   Special Requests NONE  Final   Colony Count NO GROWTH Performed at [facility]   Final   Culture NO GROWTH Performed at [facility]   Final   Report Status 07/29/2014 FINAL  Final     Studies: Dg Swallowing Func-speech Pathology  07/31/2014   Breck Coons [place], CCC-SLP     07/31/2014  4:48 PM  Objective Swallowing Evaluation: Modified Barium Swallowing Study   Patient Details  Name: Keerthi Hazell MRN: [id number] Date of Birth: [birth year]  Today's Date: 07/31/2014 Time: SLP Start Time (ACUTE ONLY): 1600-SLP Stop Time (ACUTE  ONLY): 1620 SLP Time Calculation (min) (ACUTE ONLY): 20 min  Past Medical History:  Past Medical History  Diagnosis Date  . Hypertension   . Bipolar 1 disorder   . Hypothyroidism   . Memory  difficulties   . HLD (hyperlipidemia)   . Diverticulitis large intestine   . Pneumonia    Past Surgical History:  Past Surgical History  Procedure Laterality Date  . No past surgeries     HPI:  HPI: 74 year old female with history of bipolar disorder,  hypertension, dyslipidemia, pna, hypothyroidism, CKD stage 3,  from nursing home admitted with altered mental status. CT 2/22 No  acute intracranial abnormality. CXR No acute cardiopulmonary  abnormality seen. MBS 06/23/14 with penetration and trace silent  aspiration iwth thin and decreased esophageal clearance, nectar  thick liquids and sugessted MD may wish to continue liquids only;  not certain if pt was started on solids. MBS recommened to  objectively assess current swallow function.  No Data Recorded  Assessment / Plan / Recommendation CHL IP CLINICAL IMPRESSIONS 07/31/2014  Dysphagia Diagnosis Moderate oral phase dysphagia;Moderate  pharyngeal phase dysphagia  Clinical impression Pt demonstrated moderate oral dysphagia  characterized by motor weakness and delayed transit.  Oropharyngeal phase deficits with decreased tongue base  retraction resulting in moderate amounts of residue posterior  tongue without consistent sensation. Verbal cue for 2 swallows  assisted in mild-moderate clearance. Pharyngeal phase described  with sensorimotor impairments leading to delayed swallow  initiation with nectar to valleculae and mild vallecular residue.  Silent penetration observed with large sip nectar barium. Honey  thick resulted in increased base of tongue residue and required 3  swallows to decrease. Pt masticated Dys 2 texture (graham cracker  in applesauce) with functional manipulation and tranist.  Recommend Dys 2 texture and nectar thick liquids, no straws,  pills whole in applesauce (if able) and full assistance for  compensatory strategies. Pt stated "I can't eat puree food. It  looks nasty and makes me throw up." Continued ST.       CH[facility]  IP TREATMENT RECOMMENDATION  07/31/2014  Treatment Plan Recommendations Therapy as outlined in treatment  plan below     [facility] IP DIET RECOMMENDATION 07/31/2014  Diet Recommendations Dysphagia 2 (Fine chop);Nectar-thick liquid  Liquid Administration via Cup;No straw  Medication Administration Whole meds with puree  Compensations Slow rate;Small sips/bites;Check for  pocketing;Multiple dry swallows after each bite/sip  Postural Changes and/or Swallow Maneuvers Seated upright 90  degrees;Upright 30-60 min after meal     CHL IP OTHER RECOMMENDATIONS 07/31/2014  Recommended Consults (None)  Oral Care Recommendations Oral care Q4 per protocol  Other Recommendations (None)     CHL IP FOLLOW UP RECOMMENDATIONS 07/31/2014  Follow up Recommendations Skilled Nursing facility     [facility] IP FREQUENCY AND DURATION 07/31/2014  Speech Therapy Frequency (ACUTE ONLY) min 2x/week  Treatment Duration 2 weeks     Pertinent Vitals/Pain none     No flowsheet data found.  No flowsheet data found.    CHL IP REASON FOR REFERRAL 07/31/2014  Reason for Referral Objectively evaluate swallowing function     CHL IP ORAL PHASE 07/31/2014  Lips (None)  Tongue (None)  Mucous membranes (None)  Nutritional status (None)  Other (None)  Oxygen therapy (None)  Oral Phase Impaired  Oral - Pudding Teaspoon (None)  Oral - Pudding Cup (None)  Oral - Honey Teaspoon Lingual/palatal residue;Delayed oral  transit;Right anterior bolus loss  Oral - Honey Cup Lingual/palatal residue;Delayed oral  transit;Right anterior bolus loss  Oral - Honey Syringe (None)  Oral - Nectar Teaspoon Lingual/palatal residue;Delayed oral  transit;Right anterior bolus loss  Oral - Nectar Cup Lingual/palatal residue;Delayed oral  transit;Right anterior bolus loss  Oral - Nectar Straw (None)  Oral - Nectar Syringe (None)  Oral - Ice Chips (None)  Oral - Thin Teaspoon (None)  Oral - Thin Cup (None)  Oral - Thin Straw (None)  Oral - Thin Syringe (None)  Oral - Puree Lingual/palatal residue;Delayed oral transit  Oral - Mechanical  Soft Delayed oral transit  Oral - Regular (None)  Oral - Multi-consistency (None)  Oral - Pill (None)  Oral Phase - Comment (None)      CHL IP PHARYNGEAL PHASE 07/31/2014  Pharyngeal Phase Impaired  Pharyngeal - Pudding Teaspoon (None)  Penetration/Aspiration details (pudding teaspoon) (None)  Pharyngeal - Pudding Cup (None)  Penetration/Aspiration details (pudding cup) (None)  Pharyngeal - Honey Teaspoon Pharyngeal residue -  valleculae;Reduced tongue base retraction  Penetration/Aspiration details (honey teaspoon) (None)  Pharyngeal - Honey Cup Pharyngeal residue - valleculae;Reduced  tongue base retraction  Penetration/Aspiration details (honey cup) (None)  Pharyngeal - Honey Syringe (None)  Penetration/Aspiration details (honey syringe) (None)  Pharyngeal - Nectar Teaspoon Delayed swallow initiation;Premature  spillage to valleculae;Reduced tongue base retraction  Penetration/Aspiration details (nectar teaspoon) (None)  Pharyngeal - Nectar Cup Delayed swallow initiation;Premature  spillage to valleculae;Reduced tongue base  retraction;Penetration/Aspiration during swallow;Pharyngeal  residue - valleculae  Penetration/Aspiration details (nectar cup) Material enters  airway, remains ABOVE vocal cords and not ejected out  Pharyngeal - Nectar Straw (None)  Penetration/Aspiration details (nectar straw) (None)  Pharyngeal - Nectar Syringe (None)  Penetration/Aspiration details (nectar syringe) (None)  Pharyngeal - Ice Chips (None)  Penetration/Aspiration details (ice chips) (None)  Pharyngeal - Thin Teaspoon (None)  Penetration/Aspiration details (thin teaspoon) (None)  Pharyngeal - Thin Cup (None)  Penetration/Aspiration details (thin cup) (None)  Pharyngeal - Thin Straw (None)  Penetration/Aspiration details (thin straw) (None)  Pharyngeal - Thin Syringe (None)  Penetration/Aspiration details (thin syringe') (None)  Pharyngeal - Puree Pharyngeal residue -  valleculae;Reduced tongue  base retraction   Penetration/Aspiration details (puree) (None)  Pharyngeal - Mechanical Soft (None)  Penetration/Aspiration details (mechanical soft) (None)  Pharyngeal - Regular (None)  Penetration/Aspiration details (regular) (None)  Pharyngeal - Multi-consistency (None)  Penetration/Aspiration details (multi-consistency) (None)  Pharyngeal - Pill (None)  Penetration/Aspiration details (pill) (None)  Pharyngeal Comment (None)     CHL IP CERVICAL ESOPHAGEAL PHASE 07/31/2014  Cervical Esophageal Phase WFL  Pudding Teaspoon (None)  Pudding Cup (None)  Honey Teaspoon (None)  Honey Cup (None) Honey Syringe (None)  Nectar Teaspoon (None)  Nectar Cup (None)  Nectar Straw (None)  Nectar Syringe (None)  Thin Teaspoon (None)  Thin Cup (None)  Thin Straw (None)  Thin Syringe (None)  Cervical Esophageal Comment (None)    No flowsheet data found.         Royce Macadamia 07/31/2014, 4:47 PM  Breck Coons [place] Face.Ed CCC-SLP Pager [phone]      Scheduled Meds: . antiseptic oral rinse  7 mL Mouth Rinse q12n4p  . aspirin  81 mg Oral Daily  . B-complex with vitamin C  1 tablet Oral Q breakfast  . chlorhexidine  15 mL Mouth Rinse BID  . dextrose  1 ampule Intravenous Once  . hydrochlorothiazide  25 mg Oral Daily  . insulin aspart  10 Units Intravenous Once  . lamoTRIgine  25 mg Oral BID  . [START ON 08/03/2014] levothyroxine  137 mcg Oral QAC breakfast  . potassium chloride  40 mEq Oral Q4H  . sodium chloride  3 mL Intravenous Q12H   Continuous Infusions: . dextrose 150 mL/hr at 08/02/14 0118    Principal Problem:   Acute encephalopathy Active Problems:   Hypernatremia   Drug-induced nephrogenic diabetes insipidus   Bipolar 1 disorder   Acute kidney injury   Dehydration   SIRS (systemic inflammatory response syndrome)   Essential hypertension   Dyslipidemia   Hypothyroidism   ARF (acute renal failure)   Leukocytosis    Time spent: 35 minutes    THOMPSON,DANIEL M.D. [facility] Pager [phone]. If  7PM-7AM, please contact night-coverage at [email or web address], password [facility] 08/02/2014, 1:24 PM  LOS: 7 days

## 2014-08-02 NOTE — Progress Notes (Signed)
Speech Language Pathology Treatment: Dysphagia  Patient Details Name: [name] [name] MRN: [id number] DOB: [birth year] Today's Date: 08/02/2014 Time: [id number] SLP Time Calculation (min) (ACUTE ONLY): 16 min  Assessment / Plan / Recommendation Clinical Impression  SLP observed RN providing pt potassium tablets in puree. Pt required multiple boluses of applesauce to clear broken tablet from oral cavity. Liquid wash with nectar thick liquid following meds. No overt s/s aspiration with either puree, tablet, or nectar thick liquid. [facility] RN reports pt more alert, tolerating diet well.   HPI HPI: 74 year old female with history of bipolar disorder, hypertension, dyslipidemia, pna, hypothyroidism, CKD stage 3, from nursing home admitted with altered mental status. CT 2/22 No acute intracranial abnormality. CXR No acute cardiopulmonary abnormality seen. MBS 06/23/14 with penetration and trace silent aspiration iwth thin and decreased esophageal clearance, nectar thick liquids and sugessted MD may wish to continue liquids only; not certain if pt was started on solids. MBS recommened to objectively assess current swallow function.   Pertinent Vitals Pain Assessment: 0-10 Pain Score: 6  Pain Location: legs, knees Pain Intervention(s): Patient requesting pain meds-RN notified  SLP Plan  Continue with current plan of care    Recommendations Diet recommendations: Dysphagia 2 (fine chop);Nectar-thick liquid Liquids provided via: Cup;No straw Medication Administration: Whole meds with puree Supervision: Staff to assist with self feeding;Full supervision/cueing for compensatory strategies Compensations: Slow rate;Small sips/bites;Check for pocketing;Multiple dry swallows after each bite/sip Postural Changes and/or Swallow Maneuvers: Seated upright 90 degrees;Upright 30-60 min after meal              Oral Care Recommendations: Oral care Q4 per protocol Follow up  Recommendations: Skilled Nursing facility Plan: Continue with current plan of care    GO   Celia B. Murvin NatalBueche, Breckinridge Memorial HospitalMSP, CCC-SLP [phone]  Leigh AuroraBueche, Celia Brown 08/02/2014, 4:48 PM

## 2014-08-02 NOTE — Progress Notes (Signed)
Physical Therapy Treatment Patient Details Name: [name] [name] MRN: [id number] DOB: 01-31-1941 Today's Date: 08/02/2014    History of Present Illness  [name] [name] is a 74 y.o. female adm  07/26/14 with acute encephalopathy has been in skilled nursing facility since last adm;  history of hypertension, bipolar disorder, hypothyroidism, who was the in this hospital back in November and was treated for pneumonia    PT Comments    Pt appears more alert and answering questions appropriately.  Assisted OOB to recliner stand pivot + 2 assist pt 15%.  Hoyer pad in place for nursing.  Follow Up Recommendations  SNF     Equipment Recommendations       Recommendations for Other Services       Precautions / Restrictions Precautions Precautions: Fall Restrictions Weight Bearing Restrictions: No    Mobility  Bed Mobility Overal bed mobility: Needs Assistance;+2 for physical assistance Bed Mobility: Sit to Supine       Sit to supine: +2 for physical assistance;Total assist   General bed mobility comments: assist for trunk and LEs total assist + 2 pt 15%.  Pt sat EOX only 4 min at [employer] before c/o fatigue/weakness.  Difficulty achieving midline.   Transfers Overall transfer level: Needs assistance Equipment used: None Transfers: Stand Pivot Transfers Sit to Stand: +2 physical assistance;Total assist         General transfer comment: + 2 total assist from elevated bed to recliner 1/4 pivoted turn pt 5%.  Very weak.  B knees buckled.   Ambulation/Gait                 Stairs            Wheelchair Mobility    Modified Rankin (Stroke Patients Only)       Balance                                    Cognition Arousal/Alertness: Awake/alert Behavior During Therapy: Flat affect                   General Comments: HOH    Exercises      General Comments        Pertinent Vitals/Pain Pain Assessment: No/denies pain    Home  Living                      Prior Function            PT Goals (current goals can now be found in the care plan section) Progress towards PT goals: Progressing toward goals    Frequency  Min 3X/week    PT Plan      Co-evaluation             End of Session Equipment Utilized During Treatment: Gait belt Activity Tolerance: Patient limited by fatigue Patient left: in chair;with call bell/phone within reach     Time: 1400-1410 PT Time Calculation (min) (ACUTE ONLY): 10 min  Charges:  $Therapeutic Activity: 8-22 mins                    G Codes:      Felecia ShellingLori Kacen Mellinger  PTA [facility]  Acute  Rehab Pager      [phone]

## 2014-08-02 NOTE — Consult Note (Signed)
Psychiatry Consult follow-up note  Reason for Consult:  Bipolar medication management and s/p acute encephalopathy Referring Physician:  Dr. Janee Morn  Patient Identification: [name] [name] MRN:  [id number] Principal Diagnosis: Acute encephalopathy Diagnosis:   Patient Active Problem List   Diagnosis Date Noted  . 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 ARF (acute renal failure) [N17.9]   . Leukocytosis [D72.829]   . Essential hypertension [I10] 07/26/2014  . Dyslipidemia [E78.5] 07/26/2014  . Hypothyroidism [E03.9] 07/26/2014  . SIRS (systemic inflammatory response syndrome) [A41.9] 06/17/2014  . Acute encephalopathy [G93.40] 06/16/2014  . Hypernatremia [E87.0] 06/16/2014  . Dehydration [E86.0] 06/16/2014  . Bipolar 1 disorder [F31.9] 04/26/2014  . Acute kidney injury [N17.9] 04/26/2014    Total Time spent with patient: 20 minutes  Subjective:   [name] [name] is a 74 y.o. female patient admitted with bipolar disorder.  HPI: [name] [name] is a 74 year old female seen and chart reviewed. Patient is currently suffering with acute metabolic encephalopathy and electrolyte abnormalities and not able to contribute to psychiatric evaluation. Case discussed with Dr. Janee Morn and staff RN and recommended discontinue her medications like Tegretol which can cause electrolyte abnormalities and Cogentin which can cause dehydration. Patient medication can be reintroduced then she was medically cleared are started showing behavioral or emotional problems. Patient appeared lying in her bed with the open-mouth and sleeping without distress. She has a dry mouth and lips.   Interval history: Patient seen for psychiatric consultation follow-up. Patient was appear in her bed, awake, alert and oriented to her name and age. She has slept okay and has eating without distress. She has been hydrated well and will not start her previous medication Lithium, tegretol for mood swings due to  abnormal electrolytes.  Patient continued to be confused, speech is slow and occasionally slurred. Patient is not a good historian. Patient has no dentures which makes it difficult to converse with her. I will start Lamictal 25 mg PO BID for mood swings which does not have side effect similar like other two medications.  Medical history: Patient with history of bipolar disorder, hypertension, dyslipidemia, hypothyroidism, CKD stage 3, from nursing home who presented to [facility] ED for evaluation of altered mental status. Patient at baseline talkative, able to communicate with others and last seen normal by family about 3 days prior to this admission. No reports of falls, no reports of respiratory distress, fevers, cough. No reports of vomiting. On admission, BP was stable at 124/68, HR 99, RR 35, Tmax 100.7 F and oxygen saturation of 92 % on room air.Blood work showed WBC count 13.2, sodium 157,chloride 120,creatinine 1.75 and calcium 11.2.CT head did not show acute intracranial findings. CXR showed no acute cardiopulmonary abnormalities. UA showed trace leukocytes. She was started on broad spectrum abx for treatment of possible sepsis.   Past Medical History:  Past Medical History  Diagnosis Date  . Hypertension   . Bipolar 1 disorder   . Hypothyroidism   . Memory difficulties   . HLD (hyperlipidemia)   . Diverticulitis large intestine   . Pneumonia     Past Surgical History  Procedure Laterality Date  . No past surgeries     Family History:  Family History  Problem Relation Age of Onset  . Heart disease Father    Social History:  History  Alcohol Use No     History  Drug Use No    History   Social History  . Marital Status: Single    Spouse Name:  N/A  . Number of Children: N/A  . Years of Education: N/A   Social History Main Topics  . Smoking status: Never Smoker   . Smokeless tobacco: Never Used  . Alcohol Use: No  . Drug Use: No  . Sexual Activity: No   Other Topics  Concern  . None   Social History Narrative   Additional Social History:      Allergies:   Allergies  Allergen Reactions  . Antihistamine Decongestant [Triprolidine-Pse] Diarrhea  . Codeine Other (See Comments)    Gi upset     Vitals: Blood pressure 134/68, pulse 82, temperature 97.7 F (36.5 C), temperature source Oral, resp. rate 18, height  (1.676 m), weight 63.3 kg (139 lb 8.8 oz), SpO2 99 %.  Risk to Self: Is patient at risk for suicide?: No Risk to Others:   Prior Inpatient Therapy:   Prior Outpatient Therapy:    Current Facility-Administered Medications  Medication Dose Route Frequency Provider Last Rate Last Dose  . acetaminophen (TYLENOL) suppository 650 mg  650 mg Rectal Q4H PRN Ethelda Chick, MD   650 mg at 07/28/14 0104  . acetaminophen (TYLENOL) tablet 650 mg  650 mg Oral Q6H PRN Alison Murray, MD   650 mg at 08/02/14 3244   Or  . acetaminophen (TYLENOL) suppository 650 mg  650 mg Rectal Q6H PRN Alison Murray, MD   650 mg at 07/28/14 2215  . albuterol (PROVENTIL) (2.5 MG/3ML) 0.083% nebulizer solution 2.5 mg  2.5 mg Nebulization Q4H PRN Alison Murray, MD      . antiseptic oral rinse (BIOTENE) solution 15 mL  15 mL Mouth Rinse PRN Alison Murray, MD      . antiseptic oral rinse (CPC / CETYLPYRIDINIUM CHLORIDE 0.05%) solution 7 mL  7 mL Mouth Rinse q12n4p Rodolph Bong, MD   7 mL at 08/01/14 1127  . aspirin chewable tablet 81 mg  81 mg Oral Daily Rodolph Bong, MD      . B-complex with vitamin C tablet 1 tablet  1 tablet Oral Q breakfast Alison Murray, MD   1 tablet at 08/01/14 0800  . chlorhexidine (PERIDEX) 0.12 % solution 15 mL  15 mL Mouth Rinse BID Rodolph Bong, MD   15 mL at 08/02/14 0903  . dextrose 5 % solution   Intravenous Continuous Rodolph Bong, MD 150 mL/hr at 08/02/14 0118    . dextrose 50 % solution 50 mL  1 ampule Intravenous Once Rodolph Bong, MD   50 mL at 07/29/14 1811  . guaiFENesin-dextromethorphan (ROBITUSSIN DM)  100-10 MG/5ML syrup 5 mL  5 mL Oral Q4H PRN Alison Murray, MD      . hydrochlorothiazide (HYDRODIURIL) tablet 25 mg  25 mg Oral Daily Rodolph Bong, MD   25 mg at 08/01/14 1055  . insulin aspart (novoLOG) injection 10 Units  10 Units Intravenous Once Rodolph Bong, MD   10 Units at 07/29/14 1812  . [START ON 08/03/2014] levothyroxine (SYNTHROID, LEVOTHROID) tablet 137 mcg  137 mcg Oral QAC breakfast Rodolph Bong, MD      . ondansetron [facility]) tablet 4 mg  4 mg Oral Q6H PRN Alison Murray, MD       Or  . ondansetron [facility]) injection 4 mg  4 mg Intravenous Q6H PRN Alison Murray, MD      . phenol [facility]) mouth spray 2 spray  2 spray Mouth/Throat Q2H PRN Alison Murray,  MD      . potassium chloride SA (K-DUR,KLOR-CON) CR tablet 40 mEq  40 mEq Oral Q4H Rodolph Bonganiel Thompson V, MD   40 mEq at 08/02/14 0902  . RESOURCE THICKENUP CLEAR   Oral PRN Ramiro Harvestaniel Thompson V, MD      . sodium chloride 0.9 % injection 3 mL  3 mL Intravenous Q12H Alison MurrayAlma M Devine, MD   3 mL at 08/01/14 1055    Musculoskeletal: Strength & Muscle Tone: decreased Gait & Station: unable to stand Patient leans: N/A  Psychiatric Specialty Exam: Physical Exam as per the history and physical   ROS dehydration and history of bipolar disorder, currently noncommunicative   Blood pressure 134/68, pulse 82, temperature 97.7 F (36.5 C), temperature source Oral, resp. rate 18, height 5\' 6"  (1.676 m), weight 63.3 kg (139 lb 8.8 oz), SpO2 99 %.Body mass index is 22.53 kg/(m^2).  General Appearance: Disheveled  Eye Contact::  Poor  Speech:  Slow and Slurred  Volume:  Decreased  Mood:  Anxious and Depressed  Affect:  Constricted  Thought Process:  Irrelevant and Tangential  Orientation:  Full (Time, Place, and Person)  Thought Content:  Rumination  Suicidal Thoughts:  No  Homicidal Thoughts:  No  Memory:  Immediate;   Poor Recent;   Poor  Judgement:  Fair  Insight:  Shallow  Psychomotor Activity:  Psychomotor Retardation   Concentration:  Poor  Recall:  Poor  Fund of Knowledge:Fair  Language: Fair  Akathisia:  Negative  Handed:  Right  AIMS (if indicated):     Assets:  Communication Skills Desire for Improvement Housing Intimacy Resilience Social Support  ADL's:  Impaired  Cognition: Impaired,  Moderate  Sleep:      Medical Decision Making: New problem, with additional work up planned, Review of Psycho-Social Stressors (1), Review or order clinical lab tests (1), Established Problem, Worsening (2), Review or order medicine tests (1), Review of Medication Regimen & Side Effects (2) and Review of New Medication or Change in Dosage (2)  Treatment Plan Summary: Daily contact with patient to assess and evaluate symptoms and progress in treatment and Medication management  Plan: Start Lamictal 25 mg PO BID Patient does not meet criteria for psychiatric inpatient admission. Supportive therapy provided about ongoing stressors.  Appreciate psychiatric consultation and follow up as clinically required Please contact [phone] or [phone] if needs further assistance  Disposition: Patient can be discharged to her skilled nursing facility when she is medically stable.  Colden Samaras,JANARDHAHA R. 08/02/2014 9:25 AM

## 2014-08-02 NOTE — Progress Notes (Signed)
OT Cancellation Note  Patient Details Name: [name] [name] MRN: [id number] DOB: 08-30-1940   Cancelled Treatment:    Reason Eval/Treat Not Completed: Other (comment) Noted that [facility]. Will defer OT to that venue.  Evern BioMayberry, Arisa Congleton Lynn 08/02/2014, 8:08 AM

## 2014-08-02 NOTE — Progress Notes (Addendum)
Clinical Social Work  Patient was discussed during progression meeting and MD reports that patient is not medically stable today but possibly ready tomorrow. CSW spoke with patient's son who reports he is pleased with care [name] but has heard good [facility] from [facility] and wanted to know if they would accept patient. CSW explained that CSW could check with [facility] but son is agreeable for patient to return to [name] if [facility] unable to accept. CSW left a message with [facility] and will follow up with son re: DC plans.   CSW will continue to follow.  [place]bury[place]olly Isidoro Santillana, [place] [phone]  Addendum 1500 [facility] called CSW and reports one female LT bed available. [facility] to review information and will call CSW back once they have determined if they can accept patient. CSW will continue to follow.

## 2014-08-02 NOTE — Progress Notes (Signed)
NUTRITION FOLLOW UP  Intervention:   - Magic cup TID with meals, each supplement provides 290 kcal and 9 grams of protein - RD will continue to monitor  Nutrition Dx:   Inadequate oral intake related to inability to eat as evidenced by NPO  Goal:   Pt to meet >/= 90% of their estimated nutrition needs   Monitor:   Weight trend, po intake, acceptance of supplements, labs  Assessment:   74 year old female with history of bipolar disorder, hypertension, dyslipidemia, hypothyroidism, CKD stage 3, from nursing home who [facility] for evaluation of altered mental status.  - S/P MBS 2/27. Pt seen by SLP who recommended Dysphagia II Diet with no straws, low salt, low protein. Nectar-thickened liquids. - Per RN, pt has been eating better. She had applesauce, oatmeal and orange juice fr breakfast this morning. Will order Magic Cups on all trays to improve po intake further.  - Labs reviewed- K low - Pt with mild to moderate muscle wasting at the temples  Height: Ht Readings from Last 1 Encounters:  07/26/14 5\' 6"  (1.676 m)    Weight Status:   Wt Readings from Last 1 Encounters:  08/02/14 139 lb 8.8 oz (63.3 kg)    Re-estimated needs:  Kcal: 2000-2200 Protein: 110-120 g Fluid: 2.0 L/day  Skin: intact  Diet Order: DIET DYS 2   Intake/Output Summary (Last 24 hours) at 08/02/14 1222 Last data filed at 08/02/14 0830  Gross per 24 hour  Intake 4238.5 ml  Output   1925 ml  Net 2313.5 ml    Last BM: 2/28   Labs:   Recent Labs Lab 07/26/14 1915  07/29/14 0900  07/31/14 0555 08/01/14 0541 08/01/14 1420 08/02/14 0552  NA 156*  < > 163*  < > 148* 148* 146* 145  K 3.9  < > 3.9  < > 3.5 3.5 3.5 3.2*  CL 125*  < > >130*  < > 120* 119* 116* 113*  CO2 26  < > 22  < > 25 24 27 27   BUN 19  < > 30*  < > 16 12 11 9   CREATININE 1.61*  < > 1.49*  < > 1.04 0.99 0.88 0.94  CALCIUM 9.9  < > 10.3  < > 9.4 9.2 9.0 9.3  MG 2.3  < > 2.3  --  2.0  --   --  1.8  PHOS 4.1  --    --   --   --   --   --   --   GLUCOSE 107*  < > 125*  < > 128* 117* 154* 176*  < > = values in this interval not displayed.  CBG (last 3)   Recent Labs  07/31/14 0736 08/01/14 0749 08/02/14 0811  GLUCAP 121* 111* 104*    Scheduled Meds: . antiseptic oral rinse  7 mL Mouth Rinse q12n4p  . aspirin  81 mg Oral Daily  . B-complex with vitamin C  1 tablet Oral Q breakfast  . chlorhexidine  15 mL Mouth Rinse BID  . dextrose  1 ampule Intravenous Once  . hydrochlorothiazide  25 mg Oral Daily  . insulin aspart  10 Units Intravenous Once  . [START ON 08/03/2014] levothyroxine  137 mcg Oral QAC breakfast  . potassium chloride  40 mEq Oral Q4H  . sodium chloride  3 mL Intravenous Q12H    Continuous Infusions: . dextrose 150 mL/hr at 08/02/14 0118    Emmaline KluverHaley Pattricia Weiher MS,  [place], [place]

## 2014-08-03 LAB — BASIC METABOLIC PANEL
Anion gap: 6 (ref 5–15)
Anion gap: 5 (ref 5–15)
BUN: 11 mg/dL (ref 6–23)
BUN: 12 mg/dL (ref 6–23)
CHLORIDE: 112 mmol/L (ref 96–112)
CO2: 28 mmol/L (ref 19–32)
CO2: 29 mmol/L (ref 19–32)
CREATININE: 0.99 mg/dL (ref 0.50–1.10)
CREATININE: 0.96 mg/dL (ref 0.50–1.10)
Calcium: 9.5 mg/dL (ref 8.4–10.5)
Calcium: 9 mg/dL (ref 8.4–10.5)
Chloride: 109 mmol/L (ref 96–112)
GFR calc Af Amer: 63 mL/min — ABNORMAL LOW (ref >90)
GFR calc non Af Amer: 55 mL/min — ABNORMAL LOW (ref >90)
GFR calc non Af Amer: 57 mL/min — ABNORMAL LOW (ref >90)
GFR, EST AFRICAN AMERICAN: 66 mL/min — AB (ref >90)
GLUCOSE: 98 mg/dL (ref 70–99)
Glucose, Bld: 173 mg/dL — ABNORMAL HIGH (ref 70–99)
POTASSIUM: 3.8 mmol/L (ref 3.5–5.1)
Potassium: 3.3 mmol/L — ABNORMAL LOW (ref 3.5–5.1)
Sodium: 146 mmol/L — ABNORMAL HIGH (ref 135–145)
Sodium: 143 mmol/L (ref 135–145)

## 2014-08-03 LAB — CBC
HCT: 34 % — ABNORMAL LOW (ref 36.0–46.0)
Hemoglobin: 10.5 g/dL — ABNORMAL LOW (ref 12.0–15.0)
MCH: 30.1 pg (ref 26.0–34.0)
MCHC: 30.9 g/dL (ref 30.0–36.0)
MCV: 97.4 fL (ref 78.0–100.0)
Platelets: 229 10*3/uL (ref 150–400)
RBC: 3.49 MIL/uL — ABNORMAL LOW (ref 3.87–5.11)
RDW: 13 % (ref 11.5–15.5)
WBC: 7.1 10*3/uL (ref 4.0–10.5)

## 2014-08-03 LAB — GLUCOSE, CAPILLARY: Glucose-Capillary: 88 mg/dL (ref 70–99)

## 2014-08-03 MED ORDER — POTASSIUM CHLORIDE CRYS ER 20 MEQ PO TBCR
40.0000 meq | EXTENDED_RELEASE_TABLET | Freq: Every day | ORAL | Status: DC
  Administered 2014-08-04 – 2014-08-06 (×3): 40 meq via ORAL
  Filled 2014-08-03 (×3): qty 2

## 2014-08-03 MED ORDER — DEXTROSE 5 % IV SOLN
INTRAVENOUS | Status: DC
  Administered 2014-08-03 – 2014-08-04 (×3): via INTRAVENOUS

## 2014-08-03 MED ORDER — POTASSIUM CHLORIDE CRYS ER 20 MEQ PO TBCR
40.0000 meq | EXTENDED_RELEASE_TABLET | Freq: Once | ORAL | Status: AC
  Administered 2014-08-03: 40 meq via ORAL
  Filled 2014-08-03: qty 2

## 2014-08-03 NOTE — Progress Notes (Signed)
[facility] PROGRESS NOTE  [name] [name] RUE:[id number] DOB: [birth year] DOA: 07/26/2014 PCP: Astrid Divine, MD  Assessment/Plan: #1 acute encephalopathy Likely secondary to hypernatremia in the setting of acute renal failure. Clinical improvement.  Chest x-ray was negative. CT head negative. Urinalysis was nitrite negative leukocytes negative. Urine cultures have been negative. Blood cultures pending with no growth to date. Patient's sodium level this morning is 146 from 145 from 148 from 155 from 167. NSL IVF. Serial basic metabolic profiles. Will not resume lithium again. Follow.  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 Renal function and sodium levels improving. Continue thiazide  daily. Continue low salt/low protein diet, and need weekly BMET on discharge. Will need renal outpatient follow up. No more lithium on d/c. Patient may need amiloride and DDAVP as outpatient.  #3 hypernatremia Likely secondary to dehydration and DI from lithium. NSL IVF. Serial BMETs.  #4 acute renal failure Likely prerenal azotemia and ATN as patient does look dehydrated and noted to be hypernatremic and was on Lithium PTA. Baseline creatinine 1-1.2. Renal ultrasound negative for hydronephrosis. Continue IV fluids. Renal function trending down. Will need weekly BMET and probable outpatient follow up with renal on discharge.. Follow.  #5 leukocytosis Questionable etiology. Patient is currently afebrile. Patient had been on IV Vanco and Zosyn and currently on IV Rocephin. No source of infection noted. Urine cultures are negative. Blood cultures pending today. Antibiotic have been discontinued. WBC improved. Follow.   #6 hypertension Stable. Patient not taking oral medications at this point in time. Will follow for now.  #7 dyslipidemia When tolerating oral intake will resume statin.  #8 hypothyroidism TSH was decreased. Decreased Synthroid. Will need repeat thyroid function studies  done in about 4-6 weeks.  #9 bipolar 1 disorder Will continue to hold lithium and Tegretol and Cogentin   for now as patient is acute renal failure and lithium may accumulate. Lithium should not be resumed as likely lead to nephrogenic DI.  Patient started on lamictal per psychiatry. Follow for now.   Code Status: Full Family Communication: No family at bedside. Disposition Plan: Remain inpatient, hopefully to skilled nursing facility one to 2 days.   Consultants:  None  Procedures:  CT head 07/26/2014  Chest x-ray 07/26/2014  Renal ultrasound 07/28/2014  Antibiotics:  IV Zosyn 07/26/2014>>> 07/27/2014  IV vancomycin to 22 2016>>> 07/27/2014  IV Rocephin 07/27/2014>>>> 07/28/2014  HPI/Subjective: Patient opens eyes to verbal stimuli. Patient ff commands and interacting.   Objective: Filed Vitals:   08/03/14 1322  BP: 119/79  Pulse: 79  Temp: 98.1 F (36.7 C)  Resp: 17    Intake/Output Summary (Last 24 hours) at 08/03/14 1501 Last data filed at 08/03/14 1321  Gross per 24 hour  Intake    340 ml  Output   1200 ml  Net   -860 ml   Filed Weights   08/01/14 0604 08/02/14 0605 08/03/14 0448  Weight: 61.2 kg (134 lb 14.7 oz) 63.3 kg (139 lb 8.8 oz) 64.6 kg (142 lb 6.7 oz)    Exam:   General:  NAD. Opens his eyes to verbal stimuli. Following commands. Extremely dry mucous membranes.  Cardiovscular: Regular rate rhythm no murmurs rubs or gallops.  Respiratory: Clear auscultation bilaterally anterior lung fields.  Abdomen: Obese, soft, nontender, nondistended, positive bowel sounds.  Musculoskeletal: No clubbing cyanosis or edema.  Data Reviewed: Basic Metabolic Panel:  Recent Labs Lab 07/28/14 0550  07/29/14 0900  07/31/14 0555 08/01/14 0541 08/01/14 1420 08/02/14 0552 08/03/14 0547  NA  167*  < > 163*  < > 148* 148* 146* 145 146*  K 4.1  < > 3.9  < > 3.5 3.5 3.5 3.2* 3.8  CL >130*  < > >130*  < > 120* 119* 116* 113* 112  CO2 24  < > 22  < >  GLUCOSE 160*  < > 125*  < > 128* 117* 154* 176* 98  BUN 30*  < > 30*  < > CREATININE 2.02*  < > 1.49*  < > 1.04 0.99 0.88 0.94 0.99  CALCIUM 10.9*  < > 10.3  < > 9.4 9.2 9.0 9.3 9.5  MG 2.7*  --  2.3  --  2.0  --   --  1.8  --   < > = values in this interval not displayed. Liver Function Tests:  Recent Labs Lab 07/28/14 0550  AST 29  ALT 34  ALKPHOS 101  BILITOT 0.2*  PROT 7.1  ALBUMIN 3.6   No results for input(s): LIPASE, AMYLASE in the last 168 hours. No results for input(s): AMMONIA in the last 168 hours. CBC:  Recent Labs Lab 07/28/14 0550 07/29/14 0505 07/30/14 0615 08/01/14 0541 08/02/14 0552 08/03/14 0547  WBC 17.2* 10.0 8.5 8.2 8.7 7.1  NEUTROABS 14.6* 7.8*  --   --   --   --   HGB 14.3 12.3 11.2* 10.8* 10.8* 10.5*  HCT 50.5* 43.5 38.8 35.5* 35.5* 34.0*  MCV 106.3* 105.8* 103.2* 98.6 98.1 97.4  PLT 212 154 129* 161 180 229   Cardiac Enzymes: No results for input(s): CKTOTAL, CKMB, CKMBINDEX, TROPONINI in the last 168 hours. BNP (last 3 results) No results for input(s): BNP in the last 8760 hours.  ProBNP (last 3 results) No results for input(s): PROBNP in the last 8760 hours.  CBG:  Recent Labs Lab 07/30/14 0802 07/31/14 0736 08/01/14 0749 08/02/14 0811 08/03/14 0721  GLUCAP 140* 121* 111* 104* 88    Recent Results (from the past 240 hour(s))  Blood culture (routine x 2)     Status: None   Collection Time: 07/26/14 10:33 AM  Result Value Ref Range Status   Specimen Description BLOOD LEFT HAND  Final   Special Requests BOTTLES DRAWN AEROBIC AND ANAEROBIC 5 CC EACH  Final   Culture   Final    NO GROWTH 5 DAYS Performed at [facility]    Report Status 08/01/2014 FINAL  Final  Blood culture (routine x 2)     Status: None   Collection Time: 07/26/14 10:39 AM  Result Value Ref Range Status   Specimen Description BLOOD LEFT WRIST  Final   Special Requests BOTTLES DRAWN AEROBIC AND ANAEROBIC 3 CC EA   Final   Culture   Final    NO GROWTH 5 DAYS Performed at [facility]    Report Status 08/01/2014 FINAL  Final  Urine culture     Status: None   Collection Time: 07/26/14 10:53 AM  Result Value Ref Range Status   Specimen Description URINE, CATHETERIZED  Final   Special Requests NONE  Final   Colony Count NO GROWTH Performed at [facility]   Final   Culture NO GROWTH Performed at [facility]   Final   Report Status 07/27/2014 FINAL  Final  MRSA PCR Screening     Status: Abnormal   Collection Time: 07/27/14 10:31 AM  Result Value Ref Range Status  MRSA by PCR POSITIVE (A) NEGATIVE Final    Comment:        The GeneXpert MRSA Assay (FDA approved for NASAL specimens only), is one component of a comprehensive MRSA colonization surveillance program. It is not intended to diagnose MRSA infection nor to guide or monitor treatment for MRSA infections. RESULT CALLED TO, READ BACK BY AND VERIFIED WITH: Ramond CraverLINDSEY DRAPER,RN [id number] @ 1335 BY J SCOTTON   Culture, Urine     Status: None   Collection Time: 07/28/14 11:33 AM  Result Value Ref Range Status   Specimen Description URINE, CATHETERIZED  Final   Special Requests NONE  Final   Colony Count NO GROWTH [facility]   Final   Culture NO GROWTH [facility]formed at [facility]   Final   Report Status 07/29/2014 FINAL  Final     Studies: No results found.  Scheduled Meds: . antiseptic oral rinse  7 mL Mouth Rinse q12n4p  . aspirin  81 mg Oral Daily  . B-complex with vitamin C  1 tablet Oral Q breakfast  . chlorhexidine  15 mL Mouth Rinse BID  . dextrose  1 ampule Intravenous Once  . hydrochlorothiazide  25 mg Oral Daily  . insulin aspart  10 Units Intravenous Once  . lamoTRIgine  25 mg Oral BID  . levothyroxine  137 mcg Oral QAC breakfast  . sodium chloride  3 mL Intravenous Q12H   Continuous Infusions: . dextrose 150 mL/hr at 08/03/14 1009    Principal Problem:    Acute encephalopathy Active Problems:   Hypernatremia   Drug-induced nephrogenic diabetes insipidus   Bipolar 1 disorder   Acute kidney injury   Dehydration   SIRS (systemic inflammatory response syndrome)   Essential hypertension   Dyslipidemia   Hypothyroidism   ARF (acute renal failure)   Leukocytosis    Time spent: 35 minutes    Adair Lauderback M.D. [facility] Pager [phone]. If 7PM-7AM, please contact night-coverage at [email or web address], password [email or web address] 08/03/2014, 3:01 PM  LOS: 8 days

## 2014-08-03 NOTE — Progress Notes (Signed)
Clinical Social Work  CSW spoke with [facility] who is unable to accept patient because no LT beds. CSW spoke with son and explained this information. Son reports he is fine with patient returning to [name] but wants to keep patient on [facility] waiting list. CSW encouraged son to continue to follow up with [facility] and that [name] could complete [facility] if [facility] able to [facility] in the future. CSW spoke with Melissa at [name] who is aware of DC plans and agreeable to accept patient back once medically stable.  [place]olly Keara Pag[place], [place] [phone]

## 2014-08-04 LAB — BASIC METABOLIC PANEL
ANION GAP: 6 (ref 5–15)
BUN: 9 mg/dL (ref 6–23)
CO2: 28 mmol/L (ref 19–32)
CREATININE: 0.94 mg/dL (ref 0.50–1.10)
Calcium: 9.1 mg/dL (ref 8.4–10.5)
Chloride: 104 mmol/L (ref 96–112)
GFR calc Af Amer: 68 mL/min — ABNORMAL LOW (ref >90)
GFR, EST NON AFRICAN AMERICAN: 58 mL/min — AB (ref >90)
Glucose, Bld: 111 mg/dL — ABNORMAL HIGH (ref 70–99)
Potassium: 3.4 mmol/L — ABNORMAL LOW (ref 3.5–5.1)
Sodium: 138 mmol/L (ref 135–145)

## 2014-08-04 LAB — GLUCOSE, CAPILLARY: Glucose-Capillary: 109 mg/dL — ABNORMAL HIGH (ref 70–99)

## 2014-08-04 MED ORDER — LAMOTRIGINE 25 MG PO TABS
50.0000 mg | ORAL_TABLET | Freq: Two times a day (BID) | ORAL | Status: DC
  Administered 2014-08-04 – 2014-08-06 (×4): 50 mg via ORAL
  Filled 2014-08-04 (×5): qty 2

## 2014-08-04 MED ORDER — FUROSEMIDE 20 MG PO TABS
20.0000 mg | ORAL_TABLET | Freq: Every day | ORAL | Status: DC
  Administered 2014-08-04 – 2014-08-06 (×3): 20 mg via ORAL
  Filled 2014-08-04 (×3): qty 1

## 2014-08-04 MED ORDER — ENSURE COMPLETE PO LIQD
237.0000 mL | Freq: Three times a day (TID) | ORAL | Status: DC
  Administered 2014-08-04 – 2014-08-05 (×5): 237 mL via ORAL

## 2014-08-04 MED ORDER — TRAMADOL HCL 50 MG PO TABS
50.0000 mg | ORAL_TABLET | Freq: Four times a day (QID) | ORAL | Status: DC | PRN
  Administered 2014-08-04 – 2014-08-05 (×3): 50 mg via ORAL
  Filled 2014-08-04 (×3): qty 1

## 2014-08-04 NOTE — Progress Notes (Signed)
[name] [name] ZOX:[id number] DOB: 05-15-1941 DOA: 07/26/2014 PCP: Astrid Divine, MD  Brief narrative: 74 y/o ? Htn, Bipolar with known visual and auditory hallucinations on lithium at baseline, Hypothyroidism. ASCVD hyperlipidemia, baseline CK D stage II Recent admission 1/13 severe hypernatremia 157 sodium , toxic metabolic encephalopathy-symptomatic bradycardic arrest  S/P intubation. Admitted 2/22 toxic metabolic encephalopathy 2/2 hypernatremia, hypercalcemia, WBC 13.2, MAXIMUM TEMPERATURE 100.7, creatinine 1.7-started on vancomycin/Zosyn   lithium level on admission 1.16 Psychiatry consulted and lithium discontinued and orders for Lamictal placed  Past medical history-As per Problem list Chart reviewed as below  Consult Psychiatry  Procedures   renal ultrasound  Speech evaluation and MBS 2/27 = dysphagia 2,  nectar thick full assistance     Subjective  Alert but a little confused NAd Tol jices No diarrea NO sensation to void from Foley    Objective    Interim History:   Telemetry: NSr/Sinus tach   Objective: Filed Vitals:   08/03/14 0448 08/03/14 1322 08/03/14 2149 08/04/14 0535  BP: 114/72 119/79 122/65 107/74  Pulse: 86 79 81 85  Temp: 98 F (36.7 C) 98.1 F (36.7 C) 97.8 F (36.6 C) 97.7 F (36.5 C)  TempSrc: Oral Oral Oral Oral  Resp: Height:      Weight: 64.6 kg (142 lb 6.7 oz)   59 kg (130 lb 1.1 oz)  SpO2: 100% 99% 97% 96%    Intake/Output Summary (Last 24 hours) at 08/04/14 1205 Last data filed at 08/04/14 0900  Gross per 24 hour  Intake 3427.5 ml  Output   2100 ml  Net 1327.5 ml    Exam:  General: Alert slightly confused, strabismus noted Cardiovascular: S1-S2 no murmur rub or gallop, slightly tachycardic Respiratory: Clinically clear no added sound Abdomen: Soft nontender nondistended no rebound no guarding Skin no lower extremity edema Neuro intact and moves all 4 limbs equally  Data Reviewed: Basic  Metabolic Panel:  Recent Labs Lab 07/29/14 0900  07/31/14 0555  08/01/14 1420 08/02/14 0552 08/03/14 0547 08/03/14 1440 08/04/14 0613  NA 163*  < > 148*  < > 146* 145 146* 143 138  K 3.9  < > 3.5  < > 3.5 3.2* 3.8 3.3* 3.4*  CL >130*  < > 120*  < > 116* 113* 112 109 104  CO2 22  < > 25  < > GLUCOSE 125*  < > 128*  < > 154* 176* 98 173* 111*  BUN 30*  < > 16  < > CREATININE 1.49*  < > 1.04  < > 0.88 0.94 0.99 0.96 0.94  CALCIUM 10.3  < > 9.4  < > 9.0 9.3 9.5 9.0 9.1  MG 2.3  --  2.0  --   --  1.8  --   --   --   < > = values in this interval not displayed. Liver Function Tests: No results for input(s): AST, ALT, ALKPHOS, BILITOT, PROT, ALBUMIN in the last 168 hours. No results for input(s): LIPASE, AMYLASE in the last 168 hours. No results for input(s): AMMONIA in the last 168 hours. CBC:  Recent Labs Lab 07/29/14 0505 07/30/14 0615 08/01/14 0541 08/02/14 0552 08/03/14 0547  WBC 10.0 8.5 8.2 8.7 7.1  NEUTROABS 7.8*  --   --   --   --   HGB 12.3 11.2* 10.8* 10.8* 10.5*  HCT 43.5 38.8 35.5* 35.5* 34.0*  MCV 105.8* 103.2*  98.6 98.1 97.4  PLT 154 129* 161 180 229   Cardiac Enzymes: No results for input(s): CKTOTAL, CKMB, CKMBINDEX, TROPONINI in the last 168 hours. BNP: Invalid input(s): POCBNP CBG:  Recent Labs Lab 07/31/14 0736 08/01/14 0749 08/02/14 0811 08/03/14 0721 08/04/14 0704  GLUCAP 121* 111* 104* 88 109*    Recent Results (from the past 240 hour(s))  Blood culture (routine x 2)     Status: None   Collection Time: 07/26/14 10:33 AM  Result Value Ref Range Status   Specimen Description BLOOD LEFT HAND  Final   Special Requests BOTTLES DRAWN AEROBIC AND ANAEROBIC 5 CC EACH  Final   Culture   Final    NO GROWTH 5 DAYS Performed at [facility]    Report Status 08/01/2014 FINAL  Final  Blood culture (routine x 2)     Status: None   Collection Time: 07/26/14 10:39 AM  Result Value Ref Range Status   Specimen  Description BLOOD LEFT WRIST  Final   Special Requests BOTTLES DRAWN AEROBIC AND ANAEROBIC 3 CC EA  Final   Culture   Final    NO GROWTH 5 DAYS Performed at [facility]    Report Status 08/01/2014 FINAL  Final  Urine culture     Status: None   Collection Time: 07/26/14 10:53 AM  Result Value Ref Range Status   Specimen Description URINE, CATHETERIZED  Final   Special Requests NONE  Final   Colony Count NO GROWTH Performed at [facility]   Final   Culture NO GROWTH Performed at [facility]   Final   Report Status 07/27/2014 FINAL  Final  MRSA PCR Screening     Status: Abnormal   Collection Time: 07/27/14 10:31 AM  Result Value Ref Range Status   MRSA by PCR POSITIVE (A) NEGATIVE Final    Comment:        The GeneXpert MRSA Assay (FDA approved for NASAL specimens only), is one component of a comprehensive MRSA colonization surveillance program. It is not intended to diagnose MRSA infection nor to guide or monitor treatment for MRSA infections. RESULT CALLED TO, READ BACK BY AND VERIFIED WITH: Ramond CraverLINDSEY DRAPER,RN [phone] @ 1335 BY J SCOTTON   Culture, Urine     Status: None   Collection Time: 07/28/14 11:33 AM  Result Value Ref Range Status   Specimen Description URINE, CATHETERIZED  Final   Special Requests NONE  Final   Colony Count NO GROWTH Performed at [facility]   Final   Culture NO GROWTH Performed at [facility]   Final   Report Status 07/29/2014 FINAL  Final     Studies:              All Imaging reviewed and is as per above notation   Scheduled Meds: . antiseptic oral rinse  7 mL Mouth Rinse q12n4p  . aspirin  81 mg Oral Daily  . B-complex with vitamin C  1 tablet Oral Q breakfast  . chlorhexidine  15 mL Mouth Rinse BID  . dextrose  1 ampule Intravenous Once  . feeding supplement (ENSURE COMPLETE)  237 mL Oral TID BM  . furosemide  20 mg Oral Daily  . insulin aspart  10 Units Intravenous Once  . lamoTRIgine   50 mg Oral BID  . levothyroxine  137 mcg Oral QAC breakfast  . potassium chloride  40 mEq Oral Daily  . sodium chloride  3 mL Intravenous Q12H  Continuous Infusions:    Assessment/Plan:  1. Toxic metabolic encephalopathy-multifactorial secondary to hyponatremia/acute kidney injury. All cultures to date are negative. Sodium 167 on admission-->138 currently. 2. 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  Repeat labs in a.m. 3. Acute kidney injury secondary to prerenal azotemia-Baseline creatinine 1-1.2-ultrasound of kidneys negative. Saline lock 3/2 4. Leukocytosis-initially treated with vancomycin/Zosyn 2/22-2/24 5. Bipolar type I-medications adjusted and discontinued off of lithium/Tegretol/Cogentin-patient on Lamictal 25 twice a day per psychiatry input appreciate input 6. Hypothyroidism-TSH was decreased this admission-repeat TSH in 4 weeks-continue levothyroxine 150 every morning 7. impaired glucose tolerance-monitor-no need for OP Coverage 8. History hypertension-continue amlodipine 5 daily if blood pressure elevated  Code Status: Full Family Communication:  [name],[name] Other [phone]  Disposition Plan: inpatient till resolution.   Pleas Koch, MD  [facility] Pager [phone] 08/04/2014, 12:05 PM    LOS: 9 days

## 2014-08-04 NOTE — Progress Notes (Addendum)
Speech Language Pathology Treatment: Dysphagia  Patient Details Name: [name] [name] MRN: [id number] DOB: 01/09/1941 Today's Date: 08/04/2014 Time: [id number] SLP Time Calculation (min) (ACUTE ONLY): 35 min  Assessment / Plan / Recommendation Clinical Impression S Pt seen to assess tolerance of po diet, readiness for dietary advancement.  Intake of liquids adequate per Nurse tech and pt reports she dislikes solid foods.  Pt states she has a hard time chewing foods due to lack of dentition and purees "look disgusting".    Observed pt consuming moistened graham cracker *to make soft* - pt accepted only 2 bites before saying "it's hard to chew, I don't want anymore".  Oral transiting was delayed across consistencies and suspected delayed pharyngeal swallow.  SLP reviewed clinical reasoning for compensation strategies *pt is NOT conducting dry swallows but is not demonstrating s/s of aspiration.    Given h/o silent aspiration with thin and adequate intake of nectar, recommend to continue dys2/nectar diet.  SLP recommends follow up at SNF to help determine readiness for dietary advancement.    SLP phoned dietician to see if pt could benefit from liquid supplement given pt preference for liquids.  She reports she will follow up with pt- thanks.    HPI HPI: 74 year old female with history of bipolar disorder, hypertension, dyslipidemia, pna, hypothyroidism, CKD stage 3, from nursing home admitted with altered mental status. CT 2/22 No acute intracranial abnormality. CXR No acute cardiopulmonary abnormality seen. MBS 06/23/14 with penetration and trace silent aspiration iwth thin and decreased esophageal clearance, nectar thick liquids and sugessted MD may wish to continue liquids only; not certain if pt was started on solids. MBS completed with recommendation for dys2/nectar.  Pt silently aspirated thin in the past and thin was not tested during previous MBS.     Pertinent Vitals Pain Assessment: No/denies  pain  SLP Plan  Continue with current plan of care    Recommendations Diet recommendations: Dysphagia 2 (fine chop);Nectar-thick liquid Liquids provided via: Cup;No straw Medication Administration: Whole meds with puree Supervision: Staff to assist with self feeding;Full supervision/cueing for compensatory strategies Compensations: Slow rate;Small sips/bites;Check for pocketing Postural Changes and/or Swallow Maneuvers: Seated upright 90 degrees;Upright 30-60 min after meal              Oral Care Recommendations: Oral care Q4 per protocol Follow up Recommendations: Skilled Nursing facility Plan: Continue with current plan of care    GO     [name] Burnetamara Kia Varnadore, MS [facility] SLP [phone]

## 2014-08-04 NOTE — Progress Notes (Signed)
NUTRITION FOLLOW UP  Intervention:   - Magic cup TID with meals, each supplement provides 290 kcal and 9 grams of protein - Ensure Complete po TID, each supplement provides 350 kcal and 13 grams of protein - RD will continue to monitor  Nutrition Dx:   Inadequate oral intake related to inability to eat as evidenced by NPO; improving  Goal:   Pt to meet >/= 90% of their estimated nutrition needs; improving  Monitor:   Weight trend, po intake, acceptance of supplements, labs  Assessment:   74 year old female with history of bipolar disorder, hypertension, dyslipidemia, hypothyroidism, CKD stage 3, from nursing home who presented to [facility] ED for evaluation of altered mental status.  2/29 - S/P MBS 2/27. Pt seen by SLP who recommended Dysphagia II Diet with no straws, low salt, low protein. Nectar-thickened liquids. - Per RN, pt has been eating better. She had applesauce, oatmeal and orange juice fr breakfast this morning. Will order Magic Cups on all trays to improve po intake further.  - Labs reviewed- K low - Pt with mild to moderate muscle wasting at the temples  3/2: - Spoke with SLP who said that pt does not really tolerate or like solid foods. She does better with liquids. Will order liquid supplements. - Per RN, pt is Airline pilot.  - Pt likes strawberry Ensure and agreed to drink it between meals.  - Labs reviewed                                     Height: Ht Readings from Last 1 Encounters:  07/26/14  (1.676 m)    Weight Status:   Wt Readings from Last 1 Encounters:  08/04/14 130 lb 1.1 oz (59 kg)    Re-estimated needs:  Kcal: 2000-2200 Protein: 110-120 g Fluid: 2.0 L/day  Skin: intact  Diet Order: DIET DYS 2   Intake/Output Summary (Last 24 hours) at 08/04/14 1054 Last data filed at 08/04/14 0745  Gross per 24 hour  Intake 3067.5 ml  Output   2100 ml  Net  967.5 ml    Last BM: 2/28   Labs:   Recent Labs Lab 07/29/14 0900   07/31/14 0555  08/02/14 0552 08/03/14 0547 08/03/14 1440 08/04/14 0613  NA 163*  < > 148*  < > 145 146* 143 138  K 3.9  < > 3.5  < > 3.2* 3.8 3.3* 3.4*  CL >130*  < > 120*  < > 113* 112 109 104  CO2 22  < > 25  < > BUN 30*  < > 16  < > CREATININE 1.49*  < > 1.04  < > 0.94 0.99 0.96 0.94  CALCIUM 10.3  < > 9.4  < > 9.3 9.5 9.0 9.1  MG 2.3  --  2.0  --  1.8  --   --   --   GLUCOSE 125*  < > 128*  < > 176* 98 173* 111*  < > = values in this interval not displayed.  CBG (last 3)   Recent Labs  08/02/14 0811 08/03/14 0721 08/04/14 0704  GLUCAP 104* 88 109*    Scheduled Meds: . antiseptic oral rinse  7 mL Mouth Rinse q12n4p  . aspirin  81 mg Oral Daily  . B-complex with vitamin C  1 tablet Oral Q  breakfast  . chlorhexidine  15 mL Mouth Rinse BID  . dextrose  1 ampule Intravenous Once  . furosemide  20 mg Oral Daily  . insulin aspart  10 Units Intravenous Once  . lamoTRIgine  25 mg Oral BID  . levothyroxine  137 mcg Oral QAC breakfast  . potassium chloride  40 mEq Oral Daily  . sodium chloride  3 mL Intravenous Q12H    Continuous Infusions:    Emmaline KluverHaley Cortavius Montesinos MS, RD, LDN [phone]

## 2014-08-04 NOTE — Consult Note (Signed)
Psychiatry Consult follow-up note  Reason for Consult:  Bipolar medication management and s/p acute encephalopathy Referring Physician:  Dr. Janee Morn  Patient Identification: [name] [name] MRN:  [id number] Principal Diagnosis: Acute encephalopathy Diagnosis:   Patient Active Problem List   Diagnosis Date Noted  . 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 ARF (acute renal failure) [N17.9]   . Leukocytosis [D72.829]   . Essential hypertension [I10] 07/26/2014  . Dyslipidemia [E78.5] 07/26/2014  . Hypothyroidism [E03.9] 07/26/2014  . SIRS (systemic inflammatory response syndrome) [A41.9] 06/17/2014  . Acute encephalopathy [G93.40] 06/16/2014  . Hypernatremia [E87.0] 06/16/2014  . Dehydration [E86.0] 06/16/2014  . Bipolar 1 disorder [F31.9] 04/26/2014  . Acute kidney injury [N17.9] 04/26/2014    Total Time spent with patient: 20 minutes  Subjective:   Shaylyn Bawa is a 74 y.o. female patient admitted with bipolar disorder.  HPI: Phobe Crabtree is a 74 year old female seen and chart reviewed. Patient is currently suffering with acute metabolic encephalopathy and electrolyte abnormalities and not able to contribute to psychiatric evaluation. Case discussed with Dr. Janee Morn and staff RN and recommended discontinue her medications like Tegretol which can cause electrolyte abnormalities and Cogentin which can cause dehydration. Patient medication can be reintroduced then she was medically cleared are started showing behavioral or emotional problems. Patient appeared lying in her bed with the open-mouth and sleeping without distress. She has a dry mouth and lips.   Interval history: Patient seen for psychiatric consultation follow-up along with case manager. Patient appeared sitting in her bed, awake, alert and oriented to her name, age, city, state and country but could not name the hospital. She states that she forgets where she lives but know that she has been in  Nursing home. She denied current symptoms of depression, anxiety, and psychosis. Reportedly she is sleeping and eating fine. She has tolerated her new medication lamictal and will increase today for optimum therapeutic levels. Will not start her previous medication Lithium, tegretol for mood swings due to abnormal electrolytes.  Patient seems to be less confused today, has mild cognitive deficits, repeatedly asks who we are instead of introducing ourselves, and she has low volume of speech. Patient has no dentures which makes it difficult to understand some words.   Medical history: Patient with history of bipolar disorder, hypertension, dyslipidemia, hypothyroidism, CKD stage 3, from nursing home who presented to [facility] ED for evaluation of altered mental status. Patient at baseline talkative, able to communicate with others and last seen normal by family about 3 days prior to this admission. No reports of falls, no reports of respiratory distress, fevers, cough. No reports of vomiting. On admission, BP was stable at 124/68, HR 99, RR 35, Tmax 100.7 F and oxygen saturation of 92 % on room air.Blood work showed WBC count 13.2, sodium 157,chloride 120,creatinine 1.75 and calcium 11.2.CT head did not show acute intracranial findings. CXR showed no acute cardiopulmonary abnormalities. UA showed trace leukocytes. She was started on broad spectrum abx for treatment of possible sepsis.   Past Medical History:  Past Medical History  Diagnosis Date  . Hypertension   . Bipolar 1 disorder   . Hypothyroidism   . Memory difficulties   . HLD (hyperlipidemia)   . Diverticulitis large intestine   . Pneumonia     Past Surgical History  Procedure Laterality Date  . No past surgeries     Family History:  Family History  Problem Relation Age of Onset  . Heart disease Father  Social History:  History  Alcohol Use No     History  Drug Use No    History   Social History  . Marital Status: Single     Spouse Name: N/A  . Number of Children: N/A  . Years of Education: N/A   Social History Main Topics  . Smoking status: Never Smoker   . Smokeless tobacco: Never Used  . Alcohol Use: No  . Drug Use: No  . Sexual Activity: No   Other Topics Concern  . None   Social History Narrative   Additional Social History:      Allergies:   Allergies  Allergen Reactions  . Antihistamine Decongestant [Triprolidine-Pse] Diarrhea  . Codeine Other (See Comments)    Gi upset     Vitals: Blood pressure 107/74, pulse 85, temperature 97.7 F (36.5 C), temperature source Oral, resp. rate 18, height 5\' 6"  (1.676 m), weight 59 kg (130 lb 1.1 oz), SpO2 96 %.  Risk to Self: Is patient at risk for suicide?: No Risk to Others:   Prior Inpatient Therapy:   Prior Outpatient Therapy:    Current Facility-Administered Medications  Medication Dose Route Frequency Provider Last Rate Last Dose  . acetaminophen (TYLENOL) suppository 650 mg  650 mg Rectal Q4H PRN Ethelda ChickMartha K Linker, MD   650 mg at 07/28/14 0104  . acetaminophen (TYLENOL) tablet 650 mg  650 mg Oral Q6H PRN Alison MurrayAlma M Devine, MD   650 mg at 08/03/14 2317   Or  . acetaminophen (TYLENOL) suppository 650 mg  650 mg Rectal Q6H PRN Alison MurrayAlma M Devine, MD   650 mg at 07/28/14 2215  . albuterol (PROVENTIL) (2.5 MG/3ML) 0.083% nebulizer solution 2.5 mg  2.5 mg Nebulization Q4H PRN Alison MurrayAlma M Devine, MD      . antiseptic oral rinse (BIOTENE) solution 15 mL  15 mL Mouth Rinse PRN Alison MurrayAlma M Devine, MD      . antiseptic oral rinse (CPC / CETYLPYRIDINIUM CHLORIDE 0.05%) solution 7 mL  7 mL Mouth Rinse q12n4p Rodolph Bonganiel Thompson V, MD   7 mL at 08/03/14 1555  . aspirin chewable tablet 81 mg  81 mg Oral Daily Rodolph Bonganiel Thompson V, MD   81 mg at 08/04/14 1019  . B-complex with vitamin C tablet 1 tablet  1 tablet Oral Q breakfast Alison MurrayAlma M Devine, MD   1 tablet at 08/04/14 1019  . chlorhexidine (PERIDEX) 0.12 % solution 15 mL  15 mL Mouth Rinse BID Rodolph Bonganiel Thompson V, MD   15 mL at  08/04/14 0746  . dextrose 50 % solution 50 mL  1 ampule Intravenous Once Rodolph Bonganiel Thompson V, MD   50 mL at 07/29/14 1811  . feeding supplement (ENSURE COMPLETE) (ENSURE COMPLETE) liquid 237 mL  237 mL Oral TID BM Normand SloopHaley H Lawrence, RD      . furosemide (LASIX) tablet 20 mg  20 mg Oral Daily Rhetta MuraJai-Gurmukh Samtani, MD   20 mg at 08/04/14 1019  . guaiFENesin-dextromethorphan (ROBITUSSIN DM) 100-10 MG/5ML syrup 5 mL  5 mL Oral Q4H PRN Alison MurrayAlma M Devine, MD      . insulin aspart (novoLOG) injection 10 Units  10 Units Intravenous Once Rodolph Bonganiel Thompson V, MD   10 Units at 07/29/14 1812  . lamoTRIgine (LAMICTAL) tablet 25 mg  25 mg Oral BID Nehemiah SettleJanardhaha R Bari Leib, MD   25 mg at 08/04/14 0746  . levothyroxine (SYNTHROID, LEVOTHROID) tablet 137 mcg  137 mcg Oral QAC breakfast Rodolph Bonganiel Thompson V, MD   137 mcg at  08/04/14 0745  . ondansetron (ZOFRAN) tablet 4 mg  4 mg Oral Q6H PRN Alison Murray, MD       Or  . ondansetron [facility]) injection 4 mg  4 mg Intravenous Q6H PRN Alison Murray, MD      . phenol [facility]) mouth spray 2 spray  2 spray Mouth/Throat Q2H PRN Alison Murray, MD      . potassium chloride SA (K-DUR,KLOR-CON) CR tablet 40 mEq  40 mEq Oral Daily Rodolph Bong, MD   40 mEq at 08/04/14 1020  . RESOURCE THICKENUP CLEAR   Oral PRN Ramiro Harvest V, MD      . sodium chloride 0.9 % injection 3 mL  3 mL Intravenous Q12H Alison Murray, MD   3 mL at 08/03/14 1009  . traMADol (ULTRAM) tablet 50 mg  50 mg Oral Q6H PRN Leda Gauze, NP   50 mg at 08/04/14 2440    Musculoskeletal: Strength & Muscle Tone: decreased Gait & Station: unable to stand Patient leans: N/A  Psychiatric Specialty Exam: Physical Exam as per the history and physical   ROS dehydration and history of bipolar disorder, currently noncommunicative   Blood pressure 107/74, pulse 85, temperature 97.7 F (36.5 C), temperature source Oral, resp. rate 18, height  (1.676 m), weight 59 kg (130 lb 1.1 oz), SpO2 96 %.Body mass  index is 21 kg/(m^2).  General Appearance: Disheveled  Eye Contact::  Good  Speech:  Clear and Coherent and Slow  Volume:  Decreased  Mood:  Anxious and Depressed  Affect:  Appropriate, Congruent and Depressed  Thought Process:  Coherent and Goal Directed  Orientation:  Full (Time, Place, and Person)  Thought Content:  WDL  Suicidal Thoughts:  No  Homicidal Thoughts:  No  Memory:  Immediate;   Poor Recent;   Poor  Judgement:  Fair  Insight:  Shallow  Psychomotor Activity:  Decreased  Concentration:  Fair  Recall:  Fair  Fund of Knowledge:Fair  Language: Good  Akathisia:  Negative  Handed:  Right  AIMS (if indicated):     Assets:  Communication Skills Desire for Improvement Housing Intimacy Resilience Social Support  ADL's:  Impaired  Cognition: Impaired,  Moderate  Sleep:      Medical Decision Making: New problem, with additional work up planned, Review of Psycho-Social Stressors (1), Review or order clinical lab tests (1), Established Problem, Worsening (2), Review or order medicine tests (1), Review of Medication Regimen & Side Effects (2) and Review of New Medication or Change in Dosage (2)  Treatment Plan Summary: Daily contact with patient to assess and evaluate symptoms and progress in treatment and Medication management  Plan: Increase Lamictal 25 mg ii PO BID for bipolar mood swings Patient does not meet criteria for psychiatric inpatient admission. Supportive therapy provided about ongoing stressors.  Appreciate psychiatric consultation and follow up as clinically required Please contact [phone] or [phone] if needs further assistance  Disposition: Patient can be discharged to her skilled nursing facility when she is medically stable.  Briany Aye,JANARDHAHA R. 08/04/2014 11:13 AM

## 2014-08-04 NOTE — Progress Notes (Signed)
CSW met with Pt along with psych MD. Pt was awake and alert today.  A mini mental status exam was completed with Pt; Pt demonstrated improved mental status and memory. Pt was oriented x3, but was not able to recall where she was.  Pt reported no current issues or symptoms related to her depression or bipolar diagnoses.  Psych MD is making medication recommendations. CSW will continue to follow Pt through hospital stay.  SNF has been updated on DC plans; Pt can return when stable.  Peri Maris, [place] 08/04/2014 12:05 PM

## 2014-08-05 LAB — COMPREHENSIVE METABOLIC PANEL
ALBUMIN: 2.8 g/dL — AB (ref 3.5–5.2)
ALK PHOS: 145 U/L — AB (ref 39–117)
ALT: 49 U/L — AB (ref 0–35)
AST: 50 U/L — AB (ref 0–37)
Anion gap: 8 (ref 5–15)
BILIRUBIN TOTAL: 0.4 mg/dL (ref 0.3–1.2)
BUN: 15 mg/dL (ref 6–23)
CO2: 34 mmol/L — AB (ref 19–32)
Calcium: 9.6 mg/dL (ref 8.4–10.5)
Chloride: 101 mmol/L (ref 96–112)
Creatinine, Ser: 1.01 mg/dL (ref 0.50–1.10)
GFR calc Af Amer: 62 mL/min — ABNORMAL LOW (ref >90)
GFR calc non Af Amer: 53 mL/min — ABNORMAL LOW (ref >90)
Glucose, Bld: 103 mg/dL — ABNORMAL HIGH (ref 70–99)
POTASSIUM: 3.5 mmol/L (ref 3.5–5.1)
SODIUM: 143 mmol/L (ref 135–145)
Total Protein: 5.7 g/dL — ABNORMAL LOW (ref 6.0–8.3)

## 2014-08-05 LAB — BASIC METABOLIC PANEL
ANION GAP: 3 — AB (ref 5–15)
BUN: 16 mg/dL (ref 6–23)
CALCIUM: 9 mg/dL (ref 8.4–10.5)
CO2: 37 mmol/L — ABNORMAL HIGH (ref 19–32)
Chloride: 100 mmol/L (ref 96–112)
Creatinine, Ser: 0.9 mg/dL (ref 0.50–1.10)
GFR calc non Af Amer: 61 mL/min — ABNORMAL LOW (ref >90)
GFR, EST AFRICAN AMERICAN: 71 mL/min — AB (ref >90)
GLUCOSE: 120 mg/dL — AB (ref 70–99)
Potassium: 3.7 mmol/L (ref 3.5–5.1)
SODIUM: 140 mmol/L (ref 135–145)

## 2014-08-05 LAB — CBC
HCT: 35 % — ABNORMAL LOW (ref 36.0–46.0)
Hemoglobin: 10.8 g/dL — ABNORMAL LOW (ref 12.0–15.0)
MCH: 29.4 pg (ref 26.0–34.0)
MCHC: 30.9 g/dL (ref 30.0–36.0)
MCV: 95.4 fL (ref 78.0–100.0)
Platelets: 250 10*3/uL (ref 150–400)
RBC: 3.67 MIL/uL — ABNORMAL LOW (ref 3.87–5.11)
RDW: 12.5 % (ref 11.5–15.5)
WBC: 5.9 10*3/uL (ref 4.0–10.5)

## 2014-08-05 LAB — GLUCOSE, CAPILLARY: Glucose-Capillary: 109 mg/dL — ABNORMAL HIGH (ref 70–99)

## 2014-08-05 MED ORDER — POLYETHYLENE GLYCOL 3350 17 G PO PACK
17.0000 g | PACK | Freq: Every day | ORAL | Status: DC
  Administered 2014-08-05 – 2014-08-06 (×2): 17 g via ORAL
  Filled 2014-08-05 (×2): qty 1

## 2014-08-05 NOTE — Progress Notes (Signed)
Patient ID: [name] [name], female   DOB: April 09, 1941, 74 y.o.   MRN: [id number]  Patient was seen sleeping in her room without any distress. Case manager will follow-up as needed for the disposition plans. Psychiatric consultation follow-up with her tomorrow.  Zohaib Heeney,JANARDHAHA R. 08/05/2014 2:40 PM

## 2014-08-05 NOTE — Progress Notes (Signed)
Physical Therapy Treatment Patient Details Name: [name] [name] MRN: [id number] DOB: 05/28/1941 Today's Date: 08/05/2014    History of Present Illness  [name] Arhoebe Kopp is a 74 y.o. female adm  07/26/14 with acute encephalopathy has been in skilled nursing facility since last adm;  history of hypertension, bipolar disorder, hypothyroidism, who was the in this hospital back in November and was treated for pneumonia    PT Comments    Pt in bed alert and pleasant.  Stated she needed to void.  Found pt to be incont of urine [facility] Total assist 1/4 pivoted turn "bear hug' as B knees buckle.  Pt unable to support self in standing and unable to use RW.  Assisted of BSC with NT to perform hygiene and quickly pivot 1/4 turn to recliner.  Increased time to position.  Chair alarm activated.  Follow Up Recommendations  SNF     Equipment Recommendations       Recommendations for Other Services       Precautions / Restrictions Precautions Precautions: Fall Restrictions Weight Bearing Restrictions: No    Mobility  Bed Mobility Overal bed mobility: Needs Assistance;+2 for physical assistance Bed Mobility: Supine to Sit     Supine to sit: Total assist     General bed mobility comments: assisted to EOB total assist pt 25%  Transfers Overall transfer level: Needs assistance Equipment used: None Transfers: Stand Pivot Transfers Sit to Stand: +2 physical assistance;Total assist         General transfer comment: + 2 total assist from elevated bed to recliner 1/4 pivoted turn pt 5%.  Very weak.  B knees buckled. assisted off BSC to recliner same fashion.  Rec staff use [employer] Lift.  Ambulation/Gait                 Stairs            Wheelchair Mobility    Modified Rankin (Stroke Patients Only)       Balance                                    Cognition Arousal/Alertness: Awake/alert   Overall Cognitive Status: Within Functional Limits  for tasks assessed                 General Comments: HOH  appears at prior level    Exercises      General Comments        Pertinent Vitals/Pain Pain Assessment: No/denies pain    Home Living                      Prior Function            PT Goals (current goals can now be found in the care plan section) Progress towards PT goals: Progressing toward goals    Frequency  Min 2X/week    PT Plan      Co-evaluation             End of Session Equipment Utilized During Treatment: Gait belt Activity Tolerance: Patient limited by fatigue Patient left: in chair;with call bell/phone within reach     Time: 1445-1515 PT Time Calculation (min) (ACUTE ONLY): 30 min  Charges:  $Therapeutic Activity: 23-37 mins                    G Codes:  Rica Koyanagi  PTA [facility]  Acute  Rehab Pager      [phone]

## 2014-08-05 NOTE — Progress Notes (Signed)
CSW attempted to meet with Pt along with psych MD, however Pt was observed to be sleeping in her room. CSW will continue to follow Pt to assist with any needs.  Chad CordialLauren Carter, LCSWA 08/05/2014 3:30 PM

## 2014-08-05 NOTE — Progress Notes (Signed)
[name] [name] ZOX:[id number] DOB: 02-22-1941 DOA: 07/26/2014 PCP: Astrid Divine, MD  Brief narrative: 74 y/o ? Htn, Bipolar with known visual and auditory hallucinations on lithium at baseline, Hypothyroidism. ASCVD hyperlipidemia, baseline CK D stage II Recent admission 1/13 severe hypernatremia 157 sodium , toxic metabolic encephalopathy-symptomatic bradycardic arrest  S/P intubation. Admitted 2/22 toxic metabolic encephalopathy 2/2 hypernatremia, hypercalcemia, WBC 13.2, MAXIMUM TEMPERATURE 100.7, creatinine 1.7-started on vancomycin/Zosyn   lithium level on admission 1.16 Psychiatry consulted and lithium discontinued and orders for Lamictal placed Because of her elevated lithium level, significant hypernatremia patient was kept on D5 and IV fluid repleted These were discontinued on 08/04/14 and she was encouraged to force fluids orally  Past medical history-As per Problem list Chart reviewed as below  Consult Psychiatry  Procedures   renal ultrasound  Speech evaluation and MBS 2/27 = dysphagia 2,  nectar thick full assistance     Subjective   Slight confusion no other issues at present Tolerating some diet-enjoys orange juice No chest pain no nausea no vomiting States that she has some pain in her back in her neck but these are chronic   Objective    Interim History:   Telemetry: NSr/Sinus tach   Objective: Filed Vitals:   08/04/14 1253 08/04/14 2123 08/05/14 0524 08/05/14 1608  BP: 116/71 124/68 109/69 116/78  Pulse: 79 82 80 92  Temp: 98.1 F (36.7 C) 98.2 F (36.8 C) 98.4 F (36.9 C) 97.8 F (36.6 C)  TempSrc: Oral Oral Oral Oral  Resp: Height:      Weight:   63 kg (138 lb 14.2 oz)   SpO2: 97% 96% 95% 97%    Intake/Output Summary (Last 24 hours) at 08/05/14 1851 Last data filed at 08/05/14 1300  Gross per 24 hour  Intake    840 ml  Output      0 ml  Net    840 ml    Exam:  General: Alert slightly confused, strabismus  noted--alert but somewhat tangential Cardiovascular: S1-S2 no murmur rub or gallop, slightly tachycardic Respiratory: Clinically clear no added sound Abdomen: Soft nontender nondistended no rebound no guarding Skin no lower extremity edema   Data Reviewed: Basic Metabolic Panel:  Recent Labs Lab 07/31/14 0555  08/02/14 0552 08/03/14 0547 08/03/14 1440 08/04/14 0613 08/05/14 0551 08/05/14 1425  NA 148*  < > 145 146* 143 138 143 140  K 3.5  < > 3.2* 3.8 3.3* 3.4* 3.5 3.7  CL 120*  < > 113* 112 109 104 101 100  CO2 25  < > 34* 37*  GLUCOSE 128*  < > 176* 98 173* 111* 103* 120*  BUN 16  < > CREATININE 1.04  < > 0.94 0.99 0.96 0.94 1.01 0.90  CALCIUM 9.4  < > 9.3 9.5 9.0 9.1 9.6 9.0  MG 2.0  --  1.8  --   --   --   --   --   < > = values in this interval not displayed. Liver Function Tests:  Recent Labs Lab 08/05/14 0551  AST 50*  ALT 49*  ALKPHOS 145*  BILITOT 0.4  PROT 5.7*  ALBUMIN 2.8*   No results for input(s): LIPASE, AMYLASE in the last 168 hours. No results for input(s): AMMONIA in the last 168 hours. CBC:  Recent Labs Lab 07/30/14 0615 08/01/14 0541 08/02/14 0552 08/03/14 0547 08/05/14 0551  WBC 8.5 8.2 8.7 7.1  5.9  HGB 11.2* 10.8* 10.8* 10.5* 10.8*  HCT 38.8 35.5* 35.5* 34.0* 35.0*  MCV 103.2* 98.6 98.1 97.4 95.4  PLT 129* 161 180 229 250   Cardiac Enzymes: No results for input(s): CKTOTAL, CKMB, CKMBINDEX, TROPONINI in the last 168 hours. BNP: Invalid input(s): POCBNP CBG:  Recent Labs Lab 08/01/14 0749 08/02/14 0811 08/03/14 0721 08/04/14 0704 08/05/14 0850  GLUCAP 111* 104* 88 109* 109*    Recent Results (from the past 240 hour(s))  MRSA PCR Screening     Status: Abnormal   Collection Time: 07/27/14 10:31 AM  Result Value Ref Range Status   MRSA by PCR POSITIVE (A) NEGATIVE Final    Comment:        The GeneXpert MRSA Assay (FDA approved for NASAL specimens only), is one component of a comprehensive  MRSA colonization surveillance program. It is not intended to diagnose MRSA infection nor to guide or monitor treatment for MRSA infections. RESULT CALLED TO, READ BACK BY AND VERIFIED WITH: Ramond Craver [id number] @ 1335 BY J SCOTTON   Culture, Urine     Status: None   Collection Time: 07/28/14 11:33 AM  Result Value Ref Range Status   Specimen Description URINE, CATHETERIZED  Final   Special Requests NONE  Final   Colony Count NO GROWTH Performed at [facility]   Final   Culture NO GROWTH Performed at [facility]   Final   Report Status 07/29/2014 FINAL  Final     Studies:              All Imaging reviewed and is as per above notation   Scheduled Meds: . antiseptic oral rinse  7 mL Mouth Rinse q12n4p  . aspirin  81 mg Oral Daily  . B-complex with vitamin C  1 tablet Oral Q breakfast  . chlorhexidine  15 mL Mouth Rinse BID  . dextrose  1 ampule Intravenous Once  . feeding supplement (ENSURE COMPLETE)  237 mL Oral TID BM  . furosemide  20 mg Oral Daily  . insulin aspart  10 Units Intravenous Once  . lamoTRIgine  50 mg Oral BID  . levothyroxine  137 mcg Oral QAC breakfast  . polyethylene glycol  17 g Oral Daily  . potassium chloride  40 mEq Oral Daily  . sodium chloride  3 mL Intravenous Q12H   Continuous Infusions:    Assessment/Plan:  1. Toxic metabolic encephalopathy-multifactorial secondary to hyponatremia/acute kidney injury. All cultures to date are negative. Sodium 167 on admission--> 140 currently.  Patient to force by mouth fluids, repeat basic metabolic panel in a.m. and if below 145 can probably discharge home 2. 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  Repeat labs in a.m. as above 3. Acute kidney injury secondary to prerenal azotemia-Baseline creatinine 1-1.2-ultrasound of kidneys negative. Saline lock 3/2 4. Leukocytosis-initially  treated with vancomycin/Zosyn 2/22-2/24 5. Elevated/deranged LFTs-alkaline phosphatase 145 AST 50 ALT 49 total protein 5.7-given her habitus, I suspect hepatic steatosis versus medication induced secondary to psychotropic meds. Her hepatitis panel from 06/10/14 was nonreactive 6. Bipolar type I-medications adjusted and discontinued off of lithium/Tegretol/Cogentin-patient on Lamictal 25 twice a day per psychiatry input appreciate input 7. Hypothyroidism-TSH was decreased this admission-repeat TSH in 4 weeks-continue levothyroxine 150 every morning 8. impaired glucose tolerance-monitor-no need for OP Coverage 9. History hypertension-continue amlodipine 5 daily if blood pressure elevated  Code Status: Full Family Communication:  Laprise,Matthew Other [phone]  Disposition  Plan: inpatient till resolution.  25 min  Pleas KochJai Yoselin Amerman, MD  [facility] Pager [phone] 08/05/2014, 6:51 PM    LOS: 10 days

## 2014-08-06 LAB — COMPREHENSIVE METABOLIC PANEL
ALT: 33 U/L (ref 0–35)
AST: 27 U/L (ref 0–37)
Albumin: 2.9 g/dL — ABNORMAL LOW (ref 3.5–5.2)
Alkaline Phosphatase: 119 U/L — ABNORMAL HIGH (ref 39–117)
Anion gap: 7 (ref 5–15)
BUN: 17 mg/dL (ref 6–23)
CALCIUM: 9.1 mg/dL (ref 8.4–10.5)
CO2: 32 mmol/L (ref 19–32)
Chloride: 101 mmol/L (ref 96–112)
Creatinine, Ser: 1 mg/dL (ref 0.50–1.10)
GFR, EST AFRICAN AMERICAN: 63 mL/min — AB (ref >90)
GFR, EST NON AFRICAN AMERICAN: 54 mL/min — AB (ref >90)
GLUCOSE: 99 mg/dL (ref 70–99)
Potassium: 4.2 mmol/L (ref 3.5–5.1)
Sodium: 140 mmol/L (ref 135–145)
Total Bilirubin: 0.2 mg/dL — ABNORMAL LOW (ref 0.3–1.2)
Total Protein: 5.7 g/dL — ABNORMAL LOW (ref 6.0–8.3)

## 2014-08-06 LAB — CBC WITH DIFFERENTIAL/PLATELET
Basophils Absolute: 0 10*3/uL (ref 0.0–0.1)
Basophils Relative: 0 % (ref 0–1)
EOS PCT: 4 % (ref 0–5)
Eosinophils Absolute: 0.3 10*3/uL (ref 0.0–0.7)
HEMATOCRIT: 32.7 % — AB (ref 36.0–46.0)
Hemoglobin: 10.2 g/dL — ABNORMAL LOW (ref 12.0–15.0)
LYMPHS PCT: 20 % (ref 12–46)
Lymphs Abs: 1.2 10*3/uL (ref 0.7–4.0)
MCH: 30.3 pg (ref 26.0–34.0)
MCHC: 31.2 g/dL (ref 30.0–36.0)
MCV: 97 fL (ref 78.0–100.0)
MONO ABS: 0.6 10*3/uL (ref 0.1–1.0)
Monocytes Relative: 10 % (ref 3–12)
Neutro Abs: 3.9 10*3/uL (ref 1.7–7.7)
Neutrophils Relative %: 66 % (ref 43–77)
Platelets: 288 10*3/uL (ref 150–400)
RBC: 3.37 MIL/uL — ABNORMAL LOW (ref 3.87–5.11)
RDW: 12.9 % (ref 11.5–15.5)
WBC: 6 10*3/uL (ref 4.0–10.5)

## 2014-08-06 LAB — GLUCOSE, CAPILLARY: GLUCOSE-CAPILLARY: 82 mg/dL (ref 70–99)

## 2014-08-06 MED ORDER — FUROSEMIDE 20 MG PO TABS: 20.0000 mg | ORAL_TABLET | Freq: Every day | ORAL | Status: DC

## 2014-08-06 MED ORDER — TRAMADOL HCL 50 MG PO TABS: 50.0000 mg | ORAL_TABLET | Freq: Four times a day (QID) | ORAL | Status: DC | PRN

## 2014-08-06 MED ORDER — LORAZEPAM 0.5 MG PO TABS: 0.5000 mg | ORAL_TABLET | Freq: Four times a day (QID) | ORAL | Status: DC | PRN

## 2014-08-06 MED ORDER — LAMOTRIGINE 25 MG PO TABS: 50.0000 mg | ORAL_TABLET | Freq: Two times a day (BID) | ORAL | Status: DC

## 2014-08-06 NOTE — Progress Notes (Addendum)
CSW met with Pt to inform her of discharge. CSW also spoke with Pt's son to inform him that transportation had been scheduled for 1:00pm for Pt to return to [facility]. Pt's son inquired if Pt was on waitlist at [facility]; [place] confirmed but discussed with son his responsibility to follow-up on that placement.  CSW scheduled PTAR for 1:00pm: [id number]. Per Rometta Emery at [facility] who confirmed that Pt could return today.  Discharge packet completed and placed on chart. RN to call report.  CSW signing off but available if needed.  Peri Maris, [place] 08/06/2014 11:34 AM

## 2014-08-06 NOTE — Discharge Summary (Addendum)
Physician Discharge Summary  [name] [name] EAV:[id number] DOB: 09-25-1940 DOA: 07/26/2014  PCP: Astrid Divine, MD  Admit date: 07/26/2014 Discharge date: 08/06/2014  Time spent: 35 minutes  Recommendations for Outpatient Follow-up:  1. Patient will need close follow-up with psychiatry as an outpatient-all meds have been adjusted see discharge list below carefully 2. May wish to workup her subacute back pain as an outpatient 3. Repeat basic metabolic panel as well as CBC in about one week 4. Medications adjusted this admission  Discontinue Tegretol 200 twice a day  Discontinue COMPLETELY Lithium  Discontinue Cogentin 1 tablet a.m.  Discontinue Haldol 2 mg twice a day  Dosage of thyroxine transiently changed-to continue 150 g every morning  Discontinue K Dur  Discontinue temazepam daily at bedtime  Added Lasix 20 mg daily for natriuresis--- adjust dosage based on basic metabolic panel in 3-7 days please   Discharge Diagnoses:  Principal Problem:   Acute encephalopathy Active Problems:   Bipolar 1 disorder   Acute kidney injury   Hypernatremia   Dehydration   SIRS (systemic inflammatory response syndrome)   Essential hypertension   Dyslipidemia   Hypothyroidism   ARF (acute renal failure)   Leukocytosis   Drug-induced nephrogenic diabetes insipidus   Discharge Condition: Fair  Diet recommendation: Heart healthy diabetic  Diet should be nectar thick and chopped--please ensure patient compliance   Filed Weights   08/04/14 0535 08/05/14 0524 08/06/14 0500  Weight: 59 kg (130 lb 1.1 oz) 63 kg (138 lb 14.2 oz) 60.4 kg (133 lb 2.5 oz)    History of present illness:   74 y/o ? Htn, Bipolar with known visual and auditory hallucinations on lithium at baseline, Hypothyroidism. ASCVD hyperlipidemia, baseline CK D stage II Recent admission 1/13 severe hypernatremia 157 sodium , toxic metabolic encephalopathy-symptomatic bradycardic arrest S/P  intubation. Admitted 2/22 toxic metabolic encephalopathy 2/2 hypernatremia, hypercalcemia, WBC 13.2, MAXIMUM TEMPERATURE 100.7, creatinine 1.7-started on vancomycin/Zosyn  lithium level on admission 1.16 Psychiatry consulted and lithium discontinued and orders for Lamictal placed Because of her elevated lithium level, significant hypernatremia patient was kept on D5 and IV fluid repleted These were discontinued on 08/04/14 and she was encouraged to force fluids orally    Toxic metabolic encephalopathy-multifactorial secondary to hyponatremia/acute kidney injury. All cultures to date are negative. Sodium 167 on admission--> 140 currently.  Patient to force by mouth fluids.    Acute hypernatremia secondary to nephrogenic diabetes transiently placed on HCTZ this admission , was on D5 for an extended period of time which was discontinued 3/3 -start Lasix for natriuresis-20 twice a day--because of slightly rising BUN/creatinine this was adjusted downwards on 3/3 2 20  mg daily .  Repeat labs as above  Acute kidney injury secondary to prerenal azotemia-Baseline creatinine 1-1.2-ultrasound of kidneys negative. Saline lock 3/2  Leukocytosis-initially treated with vancomycin/Zosyn 2/22-2/24--- cultures negative-no role for antibiotics afebrile on discharge  Elevated/deranged LFTs-alkaline phosphatase 145 AST 50 ALT 49 total protein 5.7-given her habitus, I suspect hepatic steatosis versus medication induced secondary to psychotropic meds. Her hepatitis panel from 06/10/14 was nonreactive--on discharge 3/4 her LFTs normalized except for a low total protein  Bipolar type I-medications adjusted and discontinued off of lithium/Tegretol/Cogentin-patient on Lamictal 25 twice a day per psychiatry input appreciate input--I would not start back any other meds unless a psychiatrist sees this patient  Hypothyroidism-TSH was decreased this admission-repeat TSH in 4 weeks-continue levothyroxine 150 every morning on  discharge home  impaired glucose tolerance-monitor-no need for OP Coverage  History hypertension-continue amlodipine 5 daily  if blood pressure elevated   Consult Psychiatry  Procedures    renal ultrasound  Speech evaluation and MBS 2/27 = dysphagia 2,  nectar thick full assistance   Discharge Exam: Filed Vitals:   08/06/14 0619  BP: 109/63  Pulse: 93  Temp: 98.3 F (36.8 C)  Resp: 18   Alert oriented complaining of some back pain Denies nausea vomiting No chest pain no shortness of breath  General: EOMI NCAT  Cardiovascular: S1-S2 no murmur rub or gallop  Respiratory: Clinically clear  Discharge Instructions    Discharge Medication List as of 08/06/2014  1:28 PM    START taking these medications   Details  furosemide (LASIX) 20 MG tablet Take 1 tablet (20 mg total) by mouth daily., Starting 08/06/2014, Until Discontinued, No Print    lamoTRIgine (LAMICTAL) 25 MG tablet Take 2 tablets (50 mg total) by mouth 2 (two) times daily., Starting 08/06/2014, Until Discontinued, Print      CONTINUE these medications which have CHANGED   Details  LORazepam (ATIVAN) 0.5 MG tablet Take 1 tablet (0.5 mg total) by mouth every 6 (six) hours as needed for anxiety (and agitation)., Starting 08/06/2014, Until Discontinued, Print    traMADol (ULTRAM) 50 MG tablet Take 1 tablet (50 mg total) by mouth every 6 (six) hours as needed (for pain)., Starting 08/06/2014, Until Discontinued, Print      CONTINUE these medications which have NOT CHANGED   Details  albuterol (PROVENTIL) (2.5 MG/3ML) 0.083% nebulizer solution Take 3 mLs (2.5 mg total) by nebulization every 4 (four) hours as needed for wheezing., Starting 06/25/2014, Until Discontinued, Normal    amLODipine (NORVASC) 5 MG tablet Take 5 mg by mouth daily with breakfast. , Until Discontinued, Historical Med    aspirin 81 MG chewable tablet Chew 1 tablet (81 mg total) by mouth daily., Starting 06/25/2014, Until Discontinued, Normal    b  complex vitamins capsule Take 1 capsule by mouth daily with breakfast., Until Discontinued, Historical Med    cholecalciferol (VITAMIN D) 1000 UNITS tablet Take 1,000 Units by mouth daily with breakfast., Until Discontinued, Historical Med    ferrous sulfate 325 (65 FE) MG tablet Take 325 mg by mouth daily with breakfast., Until Discontinued, Historical Med    lactobacillus acidophilus (BACID) TABS tablet Take 1 tablet by mouth 2 (two) times daily., Until Discontinued, Historical Med    levothyroxine (SYNTHROID, LEVOTHROID) 150 MCG tablet Take 150 mcg by mouth daily before breakfast., Until Discontinued, Historical Med    lithium carbonate 300 MG capsule Take 300-600 mg by mouth 2 (two) times daily with a meal. Take 300 mg every morning and 600 mg every night., Until Discontinued, Historical Med    senna (SENOKOT) 8.6 MG TABS tablet Take 1 tablet (8.6 mg total) by mouth 2 (two) times daily., Starting 05/03/2014, Until Discontinued, Normal    simvastatin (ZOCOR) 20 MG tablet Take 20 mg by mouth at bedtime. , Until Discontinued, Historical Med    acetaminophen (TYLENOL) 160 MG/5ML solution Take 20.3 mLs (650 mg total) by mouth every 6 (six) hours as needed for mild pain, headache or fever., Starting 06/25/2014, Until Discontinued, Normal    antiseptic oral rinse (BIOTENE) LIQD 15 mLs by Mouth Rinse route as needed for dry mouth., Until Discontinued, Historical Med    feeding supplement, RESOURCE BREEZE, (RESOURCE BREEZE) LIQD Take 1 Container by mouth 3 (three) times daily between meals., Starting 06/25/2014, Until Discontinued, Normal    Maltodextrin-Xanthan Gum (RESOURCE THICKENUP CLEAR) POWD Use as indicated., No Print  pantoprazole sodium (PROTONIX) 40 mg/20 mL PACK Take 20 mLs (40 mg total) by mouth daily., Starting 06/25/2014, Until Discontinued, Normal      STOP taking these medications     benztropine (COGENTIN) 1 MG tablet      carbamazepine (TEGRETOL) 200 MG tablet       chlorhexidine (PERIDEX) 0.12 % solution      haloperidol (HALDOL) 2 MG tablet      temazepam (RESTORIL) 7.5 MG capsule      guaiFENesin-dextromethorphan (ROBITUSSIN DM) 100-10 MG/5ML syrup      phenol (CHLORASEPTIC) 1.4 % LIQD        Allergies  Allergen Reactions  . Antihistamine Decongestant [Triprolidine-Pse] Diarrhea  . Codeine Other (See Comments)    Gi upset       The results of significant diagnostics from this hospitalization (including imaging, microbiology, ancillary and laboratory) are listed below for reference.    Significant Diagnostic Studies: Ct Head Wo Contrast  07/26/2014   CLINICAL DATA:  74 year old female with altered mental status. Loss of speech. Initial encounter.  EXAM: CT HEAD WITHOUT CONTRAST  TECHNIQUE: Contiguous axial images were obtained from the base of the skull through the vertex without intravenous contrast.  COMPARISON:  Brain MRI 06/18/2014 and earlier.  FINDINGS: No acute osseous abnormality identified. Visualized paranasal sinuses and mastoids are clear. Negative orbit and scalp soft tissues.  Mild Calcified atherosclerosis at the skull base. No ventriculomegaly. No midline shift, mass effect, or evidence of intracranial mass lesion. Stable mild to moderate for age deep gray matter nuclei hypodensity. Stable mild for age white matter hypodensity elsewhere. No evidence of cortically based acute infarction identified. No acute intracranial hemorrhage identified. No suspicious intracranial vascular hyperdensity.  IMPRESSION: No acute intracranial abnormality.  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   Electronically Signed   By: Odessa FlemingH  Hall M.D.   On: 07/26/2014 14:55   [place] Renal  07/28/2014   CLINICAL DATA:  Acute renal failure.  History of hypertension.  EXAM: RENAL/URINARY TRACT ULTRASOUND COMPLETE  COMPARISON:  Abdomen and pelvis CT, 06/17/2014  FINDINGS: Right Kidney:  Length: 8.2  cm. Borderline increased parenchymal echogenicity. Mild cortical thinning mostly noted from the upper pole. No renal mass or stone. No hydronephrosis.  Left Kidney:  Length: 10 cm. Echogenicity within normal limits. No mass or hydronephrosis visualized.  Bladder:  Decompressed by a Foley catheter.  IMPRESSION: 1. No acute findings.  No hydronephrosis. 2. Mild reduction in the size of the right kidney with mild cortical thinning and borderline increased parenchymal echogenicity, consistent with medical renal disease.   Electronically Signed   By: Amie Portlandavid  Ormond M.D.   On: 07/28/2014 16:29   Dg Chest Port 1 View  (if Code Sepsis Called)  07/26/2014   CLINICAL DATA:  Altered mental status.  EXAM: PORTABLE CHEST - 1 VIEW  COMPARISON:  June 24, 2014.  FINDINGS: The heart size and mediastinal contours are within normal limits. Both lungs are clear. No pneumothorax or pleural effusion is noted. The visualized skeletal structures are unremarkable.  IMPRESSION: No acute cardiopulmonary abnormality seen.   Electronically Signed   By: Lupita RaiderJames  Green Jr, M.D.   On: 07/26/2014 11:25   Dg Swallowing Func-speech Pathology  07/31/2014   Breck CoonsLisa Willis JeffersonLitaker, CCC-SLP     07/31/2014  4:48 PM  Objective Swallowing Evaluation: Modified Barium Swallowing Study   Patient Details  Name: [name] Arhoebe Keener MRN: [id number] Date of Birth: [birth year]  Today's Date: 07/31/2014 Time: SLP Start Time (ACUTE ONLY): 1600-SLP Stop Time (ACUTE  ONLY): 1620 SLP Time Calculation (min) (ACUTE ONLY): 20 min  Past Medical History:  Past Medical History  Diagnosis Date  . Hypertension   . Bipolar 1 disorder   . Hypothyroidism   . Memory difficulties   . HLD (hyperlipidemia)   . Diverticulitis large intestine   . Pneumonia    Past Surgical History:  Past Surgical History  Procedure Laterality Date  . No past surgeries     HPI:  HPI: 74 year old female with history of bipolar disorder,  hypertension, dyslipidemia, pna, hypothyroidism, CKD stage 3,  from nursing  home admitted with altered mental status. CT 2/22 No  acute intracranial abnormality. CXR No acute cardiopulmonary  abnormality seen. MBS 06/23/14 with penetration and trace silent  aspiration iwth thin and decreased esophageal clearance, nectar  thick liquids and sugessted MD may wish to continue liquids only;  not certain if pt was started on solids. MBS recommened to  objectively assess current swallow function.  No Data Recorded  Assessment / Plan / Recommendation CHL IP CLINICAL IMPRESSIONS 07/31/2014  Dysphagia Diagnosis Moderate oral phase dysphagia;Moderate  pharyngeal phase dysphagia  Clinical impression Pt demonstrated moderate oral dysphagia  characterized by motor weakness and delayed transit.  Oropharyngeal phase deficits with decreased tongue base  retraction resulting in moderate amounts of residue posterior  tongue without consistent sensation. Verbal cue for 2 swallows  assisted in mild-moderate clearance. Pharyngeal phase described  with sensorimotor impairments leading to delayed swallow  initiation with nectar to valleculae and mild vallecular residue.  Silent penetration observed with large sip nectar barium. Honey  thick resulted in increased base of tongue residue and required 3  swallows to decrease. Pt masticated Dys 2 texture (graham cracker  in applesauce) with functional manipulation and tranist.  Recommend Dys 2 texture and nectar thick liquids, no straws,  pills whole in applesauce (if able) and full assistance for  compensatory strategies. Pt stated "I can't eat puree food. It  looks nasty and makes me throw up." Continued ST.       CHL IP TREATMENT RECOMMENDATION 07/31/2014  Treatment Plan Recommendations Therapy as outlined in treatment  plan below     CHL IP DIET RECOMMENDATION 07/31/2014  Diet Recommendations Dysphagia 2 (Fine chop);Nectar-thick liquid  Liquid Administration via Cup;No straw  Medication Administration Whole meds with puree  Compensations Slow rate;Small  sips/bites;Check for  pocketing;Multiple dry swallows after each bite/sip  Postural Changes and/or Swallow Maneuvers Seated upright 90  degrees;Upright 30-60 min after meal     CHL IP OTHER RECOMMENDATIONS 07/31/2014  Recommended Consults (None)  Oral Care Recommendations Oral care Q4 per protocol  Other Recommendations (None)     CHL IP FOLLOW UP RECOMMENDATIONS 07/31/2014  Follow up Recommendations Skilled Nursing facility     [facility] IP FREQUENCY AND DURATION 07/31/2014  Speech Therapy Frequency (ACUTE ONLY) min 2x/week  Treatment Duration 2 weeks     Pertinent Vitals/Pain none     No flowsheet data found.  No flowsheet data found.    CHL IP REASON FOR REFERRAL 07/31/2014  Reason for Referral Objectively evaluate swallowing function     CHL IP ORAL PHASE 07/31/2014  Lips (None)  Tongue (None)  Mucous membranes (None)  Nutritional status (None)  Other (None)  Oxygen therapy (None)  Oral Phase Impaired  Oral - Pudding Teaspoon (None)  Oral - Pudding Cup (None)  Oral - Honey Teaspoon Lingual/palatal residue;Delayed oral  transit;Right anterior bolus loss  Oral - Honey Cup Lingual/palatal residue;Delayed oral  transit;Right anterior bolus loss  Oral - Honey Syringe (None)  Oral - Nectar Teaspoon Lingual/palatal residue;Delayed oral  transit;Right anterior bolus loss  Oral - Nectar Cup Lingual/palatal residue;Delayed oral  transit;Right anterior bolus loss  Oral - Nectar Straw (None)  Oral - Nectar Syringe (None)  Oral - Ice Chips (None)  Oral - Thin Teaspoon (None)  Oral - Thin Cup (None)  Oral - Thin Straw (None)  Oral - Thin Syringe (None)  Oral - Puree Lingual/palatal residue;Delayed oral transit  Oral - Mechanical Soft Delayed oral transit  Oral - Regular (None)  Oral - Multi-consistency (None)  Oral - Pill (None)  Oral Phase - Comment (None)      CHL IP PHARYNGEAL PHASE 07/31/2014  Pharyngeal Phase Impaired  Pharyngeal - Pudding Teaspoon (None)  Penetration/Aspiration details (pudding teaspoon) (None)  Pharyngeal -  Pudding Cup (None)  Penetration/Aspiration details (pudding cup) (None)  Pharyngeal - Honey Teaspoon Pharyngeal residue -  valleculae;Reduced tongue base retraction  Penetration/Aspiration details (honey teaspoon) (None)  Pharyngeal - Honey Cup Pharyngeal residue - valleculae;Reduced  tongue base retraction  Penetration/Aspiration details (honey cup) (None)  Pharyngeal - Honey Syringe (None)  Penetration/Aspiration details (honey syringe) (None)  Pharyngeal - Nectar Teaspoon Delayed swallow initiation;Premature  spillage to valleculae;Reduced tongue base retraction  Penetration/Aspiration details (nectar teaspoon) (None)  Pharyngeal - Nectar Cup Delayed swallow initiation;Premature  spillage to valleculae;Reduced tongue base  retraction;Penetration/Aspiration during swallow;Pharyngeal  residue - valleculae  Penetration/Aspiration details (nectar cup) Material enters  airway, remains ABOVE vocal cords and not ejected out  Pharyngeal - Nectar Straw (None)  Penetration/Aspiration details (nectar straw) (None)  Pharyngeal - Nectar Syringe (None)  Penetration/Aspiration details (nectar syringe) (None)  Pharyngeal - Ice Chips (None)  Penetration/Aspiration details (ice chips) (None)  Pharyngeal - Thin Teaspoon (None)  Penetration/Aspiration details (thin teaspoon) (None)  Pharyngeal - Thin Cup (None)  Penetration/Aspiration details (thin cup) (None)  Pharyngeal - Thin Straw (None)  Penetration/Aspiration details (thin straw) (None)  Pharyngeal - Thin Syringe (None)  Penetration/Aspiration details (thin syringe') (None)  Pharyngeal - Puree Pharyngeal residue - valleculae;Reduced tongue  base retraction  Penetration/Aspiration details (puree) (None)  Pharyngeal - Mechanical Soft (None)  Penetration/Aspiration details (mechanical soft) (None)  Pharyngeal - Regular (None)  Penetration/Aspiration details (regular) (None)  Pharyngeal - Multi-consistency (None)  Penetration/Aspiration details (multi-consistency) (None)   Pharyngeal - Pill (None)  Penetration/Aspiration details (pill) (None)  Pharyngeal Comment (None)     CHL IP CERVICAL ESOPHAGEAL PHASE 07/31/2014  Cervical Esophageal Phase WFL  Pudding Teaspoon (None)  Pudding Cup (None)  Honey Teaspoon (None)  Honey Cup (None) Honey Syringe (None)  Nectar Teaspoon (None)  Nectar Cup (None)  Nectar Straw (None)  Nectar Syringe (None)  Thin Teaspoon (None)  Thin Cup (None)  Thin Straw (None)  Thin Syringe (None)  Cervical Esophageal Comment (None)    No flowsheet data found.         Royce Macadamia 07/31/2014, 4:47 PM  Breck Coons Lonell Face.Ed [employer] [phone]      Microbiology: Recent Results (from the past 240 hour(s))  MRSA PCR Screening     Status: Abnormal   Collection Time: 07/27/14 10:31 AM  Result Value Ref Range Status   MRSA by PCR POSITIVE (A) NEGATIVE Final    Comment:        The GeneXpert MRSA Assay (FDA approved for NASAL specimens only), is one component of a comprehensive MRSA colonization surveillance  program. It is not intended to diagnose MRSA infection nor to guide or monitor treatment for MRSA infections. RESULT CALLED TO, READ BACK BY AND VERIFIED WITH: Ramond Craver [id number] @ 1335 BY J SCOTTON   Culture, Urine     Status: None   Collection Time: 07/28/14 11:33 AM  Result Value Ref Range Status   Specimen Description URINE, CATHETERIZED  Final   Special Requests NONE  Final   Colony Count NO GROWTH Performed at [facility]   Final   Culture NO GROWTH Performed at [facility]   Final   Report Status 07/29/2014 FINAL  Final     Labs: Basic Metabolic Panel:  Recent Labs Lab 07/31/14 0555  08/02/14 0552  08/03/14 1440 08/04/14 0613 08/05/14 0551 08/05/14 1425 08/06/14 0515  NA 148*  < > 145  < > 143 138 143 140 140  K 3.5  < > 3.2*  < > 3.3* 3.4* 3.5 3.7 4.2  CL 120*  < > 113*  < > 109 104 101 100 101  CO2 25  < > 27  < > 29 28 34* 37* 32  GLUCOSE 128*  < > 176*  < > 173* 111* 103*  120* 99  BUN 16  < > 9  < > 12 9 15 16 17   CREATININE 1.04  < > 0.94  < > 0.96 0.94 1.01 0.90 1.00  CALCIUM 9.4  < > 9.3  < > 9.0 9.1 9.6 9.0 9.1  MG 2.0  --  1.8  --   --   --   --   --   --   < > = values in this interval not displayed. Liver Function Tests:  Recent Labs Lab 08/05/14 0551 08/06/14 0515  AST 50* 27  ALT 49* 33  ALKPHOS 145* 119*  BILITOT 0.4 0.2*  PROT 5.7* 5.7*  ALBUMIN 2.8* 2.9*   No results for input(s): LIPASE, AMYLASE in the last 168 hours. No results for input(s): AMMONIA in the last 168 hours. CBC:  Recent Labs Lab 08/01/14 0541 08/02/14 0552 08/03/14 0547 08/05/14 0551 08/06/14 0515  WBC 8.2 8.7 7.1 5.9 6.0  NEUTROABS  --   --   --   --  3.9  HGB 10.8* 10.8* 10.5* 10.8* 10.2*  HCT 35.5* 35.5* 34.0* 35.0* 32.7*  MCV 98.6 98.1 97.4 95.4 97.0  PLT 161 180 229 250 288   Cardiac Enzymes: No results for input(s): CKTOTAL, CKMB, CKMBINDEX, TROPONINI in the last 168 hours. BNP: BNP (last 3 results) No results for input(s): BNP in the last 8760 hours.  ProBNP (last 3 results) No results for input(s): PROBNP in the last 8760 hours.  CBG:  Recent Labs Lab 08/02/14 0811 08/03/14 0721 08/04/14 0704 08/05/14 0850 08/06/14 0733  GLUCAP 104* 88 109* 109* 82       Signed:  Rhetta Mura  [facility] 08/06/2014, 8:51 AM

## 2014-08-06 NOTE — Progress Notes (Signed)
[facility] at [name] Pt alert and aware of transfer. VS WNL.

## 2014-08-06 NOTE — Progress Notes (Signed)
[name] [place] Pt is scheduled to leave hospital via ambulance today.

## 2014-09-22 ENCOUNTER — Emergency Department (HOSPITAL_COMMUNITY): Payer: Medicare Other

## 2014-09-22 ENCOUNTER — Encounter (HOSPITAL_COMMUNITY): Payer: Self-pay | Admitting: *Deleted

## 2014-09-22 ENCOUNTER — Emergency Department (HOSPITAL_COMMUNITY)
Admission: EM | Admit: 2014-09-22 | Discharge: 2014-09-23 | Disposition: A | Discharge status: Home / Self Care | Payer: Medicare Other | Attending: Emergency Medicine | Admitting: Emergency Medicine

## 2014-09-22 DIAGNOSIS — Z7982 Long term (current) use of aspirin: Secondary | ICD-10-CM | POA: Insufficient documentation

## 2014-09-22 DIAGNOSIS — I10 Essential (primary) hypertension: Secondary | ICD-10-CM | POA: Diagnosis not present

## 2014-09-22 DIAGNOSIS — Y998 Other external cause status: Secondary | ICD-10-CM | POA: Insufficient documentation

## 2014-09-22 DIAGNOSIS — S0990XA Unspecified injury of head, initial encounter: Secondary | ICD-10-CM

## 2014-09-22 DIAGNOSIS — F319 Bipolar disorder, unspecified: Secondary | ICD-10-CM | POA: Diagnosis not present

## 2014-09-22 DIAGNOSIS — F0390 Unspecified dementia without behavioral disturbance: Secondary | ICD-10-CM | POA: Insufficient documentation

## 2014-09-22 DIAGNOSIS — Z79899 Other long term (current) drug therapy: Secondary | ICD-10-CM | POA: Diagnosis not present

## 2014-09-22 DIAGNOSIS — S161XXA Strain of muscle, fascia and tendon at neck level, initial encounter: Secondary | ICD-10-CM | POA: Diagnosis not present

## 2014-09-22 DIAGNOSIS — S0101XA Laceration without foreign body of scalp, initial encounter: Secondary | ICD-10-CM | POA: Insufficient documentation

## 2014-09-22 DIAGNOSIS — Z8719 Personal history of other diseases of the digestive system: Secondary | ICD-10-CM | POA: Diagnosis not present

## 2014-09-22 DIAGNOSIS — Y92128 Other place in nursing home as the place of occurrence of the external cause: Secondary | ICD-10-CM | POA: Diagnosis not present

## 2014-09-22 DIAGNOSIS — E785 Hyperlipidemia, unspecified: Secondary | ICD-10-CM | POA: Diagnosis not present

## 2014-09-22 DIAGNOSIS — Y9389 Activity, other specified: Secondary | ICD-10-CM | POA: Insufficient documentation

## 2014-09-22 DIAGNOSIS — W19XXXA Unspecified fall, initial encounter: Secondary | ICD-10-CM

## 2014-09-22 DIAGNOSIS — E039 Hypothyroidism, unspecified: Secondary | ICD-10-CM | POA: Insufficient documentation

## 2014-09-22 DIAGNOSIS — Z8701 Personal history of pneumonia (recurrent): Secondary | ICD-10-CM | POA: Insufficient documentation

## 2014-09-22 DIAGNOSIS — W01198A Fall on same level from slipping, tripping and stumbling with subsequent striking against other object, initial encounter: Secondary | ICD-10-CM | POA: Diagnosis not present

## 2014-09-22 NOTE — ED Provider Notes (Signed)
CSN: [id number]     Arrival date & time 09/22/14  0250 History  This chart was scribed for Geoffery Lyonsouglas Ashlynd Michna, MD by Annye AsaAnna Dorsett, ED Scribe. This patient was seen in room A01C/A01C and the patient's care was started at 2:59 AM.   Level 5 Caveat; Dementia    Chief Complaint  Patient presents with  . Fall   Patient is a 74 y.o. female presenting with fall. The history is provided by the patient and the EMS personnel. No language interpreter was used.  Fall  HPI Comments: [name] Arhoebe Zeitler is a 74 y.o. female who presents to the Emergency Department complaining of fall. Per EMS, [name]; she fell 45 minutes PTA and hit the back of her head on a table, resulting in a laceration to the back of her head. No known LOC. Patient is confused at baseline. She currently complains of pain "everywhere."   Past Medical History  Diagnosis Date  . Hypertension   . Bipolar 1 disorder   . Hypothyroidism   . Memory difficulties   . HLD (hyperlipidemia)   . Diverticulitis large intestine   . Pneumonia    Past Surgical History  Procedure Laterality Date  . No past surgeries     Family History  Problem Relation Age of Onset  . Heart disease Father    History  Substance Use Topics  . Smoking status: Never Smoker   . Smokeless tobacco: Never Used  . Alcohol Use: No   OB History    No data available     Review of Systems  A complete 10 system review of systems was obtained and all systems are negative except as noted in the HPI and PMH.    Allergies  Antihistamine decongestant and Codeine  Home Medications   Prior to Admission medications   Medication Sig Start Date End Date Taking? Authorizing Provider  acetaminophen (TYLENOL) 160 MG/5ML solution Take 20.3 mLs (650 mg total) by mouth every 6 (six) hours as needed for mild pain, headache or fever. Patient not taking: Reported on 07/26/2014 06/25/14   Alison MurrayAlma M Devine, MD  albuterol (PROVENTIL) (2.5 MG/3ML)  0.083% nebulizer solution Take 3 mLs (2.5 mg total) by nebulization every 4 (four) hours as needed for wheezing. 06/25/14   Alison MurrayAlma M Devine, MD  amLODipine (NORVASC) 5 MG tablet Take 5 mg by mouth daily with breakfast.     Historical Provider, MD  antiseptic oral rinse (BIOTENE) LIQD 15 mLs by Mouth Rinse route as needed for dry mouth.    Historical Provider, MD  aspirin 81 MG chewable tablet Chew 1 tablet (81 mg total) by mouth daily. 06/25/14   Alison MurrayAlma M Devine, MD  b complex vitamins capsule Take 1 capsule by mouth daily with breakfast.    Historical Provider, MD  cholecalciferol (VITAMIN D) 1000 UNITS tablet Take 1,000 Units by mouth daily with breakfast.    Historical Provider, MD  feeding supplement, RESOURCE BREEZE, (RESOURCE BREEZE) LIQD Take 1 Container by mouth 3 (three) times daily between meals. Patient not taking: Reported on 07/26/2014 06/25/14   Alison MurrayAlma M Devine, MD  ferrous sulfate 325 (65 FE) MG tablet Take 325 mg by mouth daily with breakfast.    Historical Provider, MD  furosemide (LASIX) 20 MG tablet Take 1 tablet (20 mg total) by mouth daily. 08/06/14   Rhetta MuraJai-Gurmukh Samtani, MD  lactobacillus acidophilus (BACID) TABS tablet Take 1 tablet by mouth 2 (two) times daily.    Historical Provider,  MD  lamoTRIgine (LAMICTAL) 25 MG tablet Take 2 tablets (50 mg total) by mouth 2 (two) times daily. 08/06/14   Rhetta Mura, MD  levothyroxine (SYNTHROID, LEVOTHROID) 150 MCG tablet Take 150 mcg by mouth daily before breakfast.    Historical Provider, MD  lithium carbonate 300 MG capsule Take 300-600 mg by mouth 2 (two) times daily with a meal. Take 300 mg every morning and 600 mg every night.    Historical Provider, MD  LORazepam (ATIVAN) 0.5 MG tablet Take 1 tablet (0.5 mg total) by mouth every 6 (six) hours as needed for anxiety (and agitation). 08/06/14   Rhetta Mura, MD  Maltodextrin-Xanthan Gum (RESOURCE THICKENUP CLEAR) POWD Use as indicated. Patient not taking: Reported on 07/26/2014  05/03/14   Kathlen Mody, MD  pantoprazole sodium (PROTONIX) 40 mg/20 mL PACK Take 20 mLs (40 mg total) by mouth daily. Patient not taking: Reported on 07/26/2014 06/25/14   Alison Murray, MD  senna (SENOKOT) 8.6 MG TABS tablet Take 1 tablet (8.6 mg total) by mouth 2 (two) times daily. 05/03/14   Kathlen Mody, MD  simvastatin (ZOCOR) 20 MG tablet Take 20 mg by mouth at bedtime.     Historical Provider, MD  traMADol (ULTRAM) 50 MG tablet Take 1 tablet (50 mg total) by mouth every 6 (six) hours as needed (for pain). 08/06/14   Rhetta Mura, MD   BP 107/62 mmHg  Pulse 61  Temp(Src) 97.6 F (36.4 C) (Oral)  Resp 18  SpO2 100% Physical Exam  Constitutional: She appears well-developed and well-nourished. No distress.  Patient is a 74 year old female with history of dementia. She is awake, alert, but confused.  HENT:  Head: Normocephalic.  Mouth/Throat: Oropharynx is clear and moist. No oropharyngeal exudate.  There is a 2.5cm laceration to the occiput.   Eyes: EOM are normal. Pupils are equal, round, and reactive to light.  Neck: Normal range of motion. Neck supple. No JVD present.  Cardiovascular: Normal rate, regular rhythm and normal heart sounds.  Exam reveals no gallop and no friction rub.   No murmur heard. Pulmonary/Chest: Effort normal and breath sounds normal. No respiratory distress. She has no wheezes. She has no rales.  Abdominal: Soft. Bowel sounds are normal. She exhibits no mass. There is no tenderness. There is no rebound and no guarding.  Musculoskeletal: Normal range of motion. She exhibits no edema.  Moves all extremities normally.   Lymphadenopathy:    She has no cervical adenopathy.  Neurological: She is alert. She displays normal reflexes.  Awake/demented and somewhat confused, facies symmetric. Cranial nerves 3/4/5/6/12/10/08/11/12 tested and intact. Moves all extremities normally.    Skin: Skin is warm and dry. No rash noted.  Psychiatric: She has a normal mood and  affect. Her behavior is normal.  Nursing note and vitals reviewed.   ED Course  Procedures   DIAGNOSTIC STUDIES: Oxygen Saturation is 100% on RA, normal by my interpretation.    COORDINATION OF CARE: 3:07 AM Discussed treatment plan with pt at bedside and pt agreed to plan.   Labs Review Labs Reviewed - No data to display  Imaging Review No results found.   EKG Interpretation None     LACERATION REPAIR Performed by: Geoffery Lyons Authorized by: Geoffery Lyons Consent: Verbal consent obtained. Risks and benefits: risks, benefits and alternatives were discussed Consent given by: patient Patient identity confirmed: provided demographic data Prepped and Draped in normal sterile fashion Wound explored  Laceration Location: occiput  Laceration Length: 2.5cm  No Foreign  Bodies seen or palpated  Anesthesia: local infiltration  Local anesthetic: none  Anesthetic total: 0  Wound cleaning: standard  Skin closure: staples  Number of staples: 3  Technique: staples  Patient tolerance: Patient tolerated the procedure well with no immediate complications.   MDM   Final diagnoses:  None    Patient brought for evaluation after a fall at the nursing home. She apparently fell backward and hit the back of her head on a table. There was no reported loss of consciousness, but she did sustain a laceration to the occiput. This was repaired with 3 staples. CT of the head and cervical spine are negative for fracture. There appears to be no other injury and I believe she is appropriate for discharge. Staples are to be removed in 5 days. The return as needed for any problems.   I personally performed the services described in this documentation, which was scribed in my presence. The recorded information has been reviewed and is accurate.       Geoffery Lyons, MD 09/22/14 [phone]

## 2014-09-22 NOTE — Discharge Instructions (Signed)
Sutures are to be removed in 5-7 days. Follow-up with your primary Dr. for this, and return to the ER if you develop redness, pus draining from the wound, increased pain, or other new and concerning symptoms.   Laceration Care, Adult A laceration is a cut that goes through all layers of the skin. The cut goes into the tissue beneath the skin. HOME CARE For stitches (sutures) or staples:  Keep the cut clean and dry.  If you have a bandage (dressing), change it at least once a day. Change the bandage if it gets wet or dirty, or as told by your doctor.  Wash the cut with soap and water 2 times a day. Rinse the cut with water. Pat it dry with a clean towel.  Put a thin layer of medicated cream on the cut as told by your doctor.  You may shower after the first 24 hours. Do not soak the cut in water until the stitches are removed.  Only take medicines as told by your doctor.  Have your stitches or staples removed as told by your doctor. For skin adhesive strips:  Keep the cut clean and dry.  Do not get the strips wet. You may take a bath, but be careful to keep the cut dry.  If the cut gets wet, pat it dry with a clean towel.  The strips will fall off on their own. Do not remove the strips that are still stuck to the cut. For wound glue:  You may shower or take baths. Do not soak or scrub the cut. Do not swim. Avoid heavy sweating until the glue falls off on its own. After a shower or bath, pat the cut dry with a clean towel.  Do not put medicine on your cut until the glue falls off.  If you have a bandage, do not put tape over the glue.  Avoid lots of sunlight or tanning lamps until the glue falls off. Put sunscreen on the cut for the first year to reduce your scar.  The glue will fall off on its own. Do not pick at the glue. You may need a tetanus shot if:  You cannot remember when you had your last tetanus shot.  You have never had a tetanus shot. If you need a tetanus shot  and you choose not to have one, you may get tetanus. Sickness from tetanus can be serious. GET HELP RIGHT AWAY IF:   Your pain does not get better with medicine.  Your arm, hand, leg, or foot loses feeling (numbness) or changes color.  Your cut is bleeding.  Your joint feels weak, or you cannot use your joint.  You have painful lumps on your body.  Your cut is red, puffy (swollen), or painful.  You have a red line on the skin near the cut.  You have yellowish-white fluid (pus) coming from the cut.  You have a fever.  You have a bad smell coming from the cut or bandage.  Your cut breaks open before or after stitches are removed.  You notice something coming out of the cut, such as wood or glass.  You cannot move a finger or toe. MAKE SURE YOU:   Understand these instructions.  Will watch your condition.  Will get help right away if you are not doing well or get worse. Document Released: 11/07/2007 Document Revised: 08/13/2011 Document Reviewed: 11/14/2010 [facility] Patient Information 2015 [place]. This information is not intended to replace advice given  to you by your health care provider. Make sure you discuss any questions you have with your health care provider.  Head Injury You have received a head injury. It does not appear serious at this time. Headaches and vomiting are common following head injury. It should be easy to awaken from sleeping. Sometimes it is necessary for you to stay in the emergency department for a while for observation. Sometimes admission to the hospital may be needed. After injuries such as yours, most problems occur within the first 24 hours, but side effects may occur up to 7-10 days after the injury. It is important for you to carefully monitor your condition and contact your health care provider or seek immediate medical care if there is a change in your condition. WHAT ARE THE TYPES OF HEAD INJURIES? Head injuries can be as minor as a  bump. Some head injuries can be more severe. More severe head injuries include:  A jarring injury to the brain (concussion).  A bruise of the brain (contusion). This mean there is bleeding in the brain that can cause swelling.  A cracked skull (skull fracture).  Bleeding in the brain that collects, clots, and forms a bump (hematoma). WHAT CAUSES A HEAD INJURY? A serious head injury is most likely to happen to someone who is in a car wreck and is not wearing a seat belt. Other causes of major head injuries include bicycle or motorcycle accidents, sports injuries, and falls. HOW ARE HEAD INJURIES DIAGNOSED? A complete history of the event leading to the injury and your current symptoms will be helpful in diagnosing head injuries. Many times, pictures of the brain, such as CT or MRI are needed to see the extent of the injury. Often, an overnight hospital stay is necessary for observation.  WHEN SHOULD I SEEK IMMEDIATE MEDICAL CARE?  You should get help right away if:  You have confusion or drowsiness.  You feel sick to your stomach (nauseous) or have continued, forceful vomiting.  You have dizziness or unsteadiness that is getting worse.  You have severe, continued headaches not relieved by medicine. Only take over-the-counter or prescription medicines for pain, fever, or discomfort as directed by your health care provider.  You do not have normal function of the arms or legs or are unable to walk.  You notice changes in the black spots in the center of the colored part of your eye (pupil).  You have a clear or bloody fluid coming from your nose or ears.  You have a loss of vision. During the next 24 hours after the injury, you must stay with someone who can watch you for the warning signs. This person should contact local emergency services (911 in the [place] if you have seizures, you become unconscious, or you are unable to wake up. HOW CAN I PREVENT A HEAD INJURY IN THE FUTURE? The  most important factor for preventing major head injuries is avoiding motor vehicle accidents. To minimize the potential for damage to your head, it is crucial to wear seat belts while riding in motor vehicles. Wearing helmets while bike riding and playing collision sports (like football) is also helpful. Also, avoiding dangerous activities around the house will further help reduce your risk of head injury.  WHEN CAN I RETURN TO NORMAL ACTIVITIES AND ATHLETICS? You should be reevaluated by your health care provider before returning to these activities. If you have any of the following symptoms, you should not return to activities or contact sports until  1 week after the symptoms have stopped:  Persistent headache.  Dizziness or vertigo.  Poor attention and concentration.  Confusion.  Memory problems.  Nausea or vomiting.  Fatigue or tire easily.  Irritability.  Intolerant of bright lights or loud noises.  Anxiety or depression.  Disturbed sleep. MAKE SURE YOU:   Understand these instructions.  Will watch your condition.  Will get help right away if you are not doing well or get worse. Document Released: 05/21/2005 Document Revised: 05/26/2013 Document Reviewed: 01/26/2013 [facility] Patient Information 2015 [place], [place]. This information is not intended to replace advice given to you by your health care provider. Make sure you discuss any questions you have with your health care provider.  Fall Prevention in Hospitals As a hospital patient, your condition and the treatments you receive can increase your risk for falls. Some additional risk factors for falls in a hospital include:  Being in an unfamiliar environment.  Being on bed rest.  Your surgery.  Taking certain medicines.  Your tubing requirements, such as intravenous (IV) therapy or catheters. It is important that you learn how to decrease fall risks while at the hospital. Below are important tips that can help  prevent falls. SAFETY TIPS FOR PREVENTING FALLS Talk about your risk of falling.  Ask your caregiver why you are at risk for falling. Is it your medicine, illness, tubing placement, or something else?  Make a plan with your caregiver to keep you safe from falls.  Ask your caregiver or pharmacist about side effect of your medicines. Some medicines can make you dizzy or affect your coordination. Ask for help.  Ask for help before getting out of bed. You may need to press your call button.  Ask for assistance in getting you safely to the toilet.  Ask for a walker or cane to be put at your bedside. Ask that most of the side rails on your bed be placed up before your caregiver leaves the room.  Ask family or friends to sit with you.  Ask for things that are out of your reach, such as your glasses, hearing aids, telephone, bedside table, or call button. Follow these tips to avoid falling:  Stay lying or seated, rather than standing, while waiting for help.  Wear rubber-soled slippers or shoes whenever you walk in the hospital.  Avoid quick, sudden movements.  Change positions slowly.  Sit on the side of your bed before standing.  Stand up slowly and wait before you start to walk.  Let your caregiver know if there is a spill on the floor.  Pay careful attention to the medical equipment, electrical cords, and tubes around you.  When you need help, use your call button by your bed or in the bathroom. Wait for one of your caregivers to help you.  If you feel dizzy or unsure of your footing, return to bed and wait for assistance.  Avoid being distracted by the TV, telephone, or another person in your room.  Do not lean or support yourself on rolling objects, such as IV poles or bedside tables. Document Released: 05/18/2000 Document Revised: 05/07/2012 Document Reviewed: 01/27/2012 [facility] Patient Information 2015 [place], [place]. This information is not intended to replace advice  given to you by your health care provider. Make sure you discuss any questions you have with your health care provider.

## 2014-09-22 NOTE — ED Notes (Signed)
[facility] Pt fell 45 mins ago and hit her head on a table. Pt had laceration to back of head. No LOC. Pt has a baseline of confusion.

## 2014-09-22 NOTE — ED Notes (Signed)
Pt sent back home with PTAR, d/c instructions given and patient signed d/c form.

## 2014-10-26 ENCOUNTER — Encounter (HOSPITAL_COMMUNITY): Payer: Self-pay | Admitting: *Deleted

## 2014-10-26 ENCOUNTER — Other Ambulatory Visit (HOSPITAL_COMMUNITY): Payer: Self-pay

## 2014-10-26 ENCOUNTER — Emergency Department (HOSPITAL_COMMUNITY): Payer: Medicare Other

## 2014-10-26 ENCOUNTER — Inpatient Hospital Stay (HOSPITAL_COMMUNITY)
Admission: EM | Admit: 2014-10-26 | Discharge: 2014-11-03 | DRG: 193 | Disposition: A | Discharge status: Skilled Nursing Facility | Payer: Medicare Other | Attending: Internal Medicine | Admitting: Internal Medicine

## 2014-10-26 DIAGNOSIS — D649 Anemia, unspecified: Secondary | ICD-10-CM | POA: Diagnosis present

## 2014-10-26 DIAGNOSIS — E860 Dehydration: Secondary | ICD-10-CM | POA: Diagnosis present

## 2014-10-26 DIAGNOSIS — D7589 Other specified diseases of blood and blood-forming organs: Secondary | ICD-10-CM | POA: Diagnosis present

## 2014-10-26 DIAGNOSIS — E785 Hyperlipidemia, unspecified: Secondary | ICD-10-CM | POA: Diagnosis present

## 2014-10-26 DIAGNOSIS — R4182 Altered mental status, unspecified: Secondary | ICD-10-CM | POA: Diagnosis present

## 2014-10-26 DIAGNOSIS — N390 Urinary tract infection, site not specified: Secondary | ICD-10-CM | POA: Diagnosis present

## 2014-10-26 DIAGNOSIS — N179 Acute kidney failure, unspecified: Secondary | ICD-10-CM | POA: Diagnosis present

## 2014-10-26 DIAGNOSIS — J189 Pneumonia, unspecified organism: Principal | ICD-10-CM | POA: Diagnosis present

## 2014-10-26 DIAGNOSIS — E43 Unspecified severe protein-calorie malnutrition: Secondary | ICD-10-CM | POA: Diagnosis present

## 2014-10-26 DIAGNOSIS — Z7901 Long term (current) use of anticoagulants: Secondary | ICD-10-CM

## 2014-10-26 DIAGNOSIS — R630 Anorexia: Secondary | ICD-10-CM | POA: Diagnosis not present

## 2014-10-26 DIAGNOSIS — E870 Hyperosmolality and hypernatremia: Secondary | ICD-10-CM | POA: Diagnosis present

## 2014-10-26 DIAGNOSIS — F0390 Unspecified dementia without behavioral disturbance: Secondary | ICD-10-CM | POA: Diagnosis present

## 2014-10-26 DIAGNOSIS — B964 Proteus (mirabilis) (morganii) as the cause of diseases classified elsewhere: Secondary | ICD-10-CM | POA: Diagnosis present

## 2014-10-26 DIAGNOSIS — Y95 Nosocomial condition: Secondary | ICD-10-CM | POA: Diagnosis present

## 2014-10-26 DIAGNOSIS — R627 Adult failure to thrive: Secondary | ICD-10-CM | POA: Diagnosis present

## 2014-10-26 DIAGNOSIS — E039 Hypothyroidism, unspecified: Secondary | ICD-10-CM | POA: Diagnosis present

## 2014-10-26 DIAGNOSIS — F319 Bipolar disorder, unspecified: Secondary | ICD-10-CM | POA: Diagnosis present

## 2014-10-26 DIAGNOSIS — I10 Essential (primary) hypertension: Secondary | ICD-10-CM | POA: Diagnosis present

## 2014-10-26 DIAGNOSIS — Z6820 Body mass index (BMI) 20.0-20.9, adult: Secondary | ICD-10-CM | POA: Diagnosis not present

## 2014-10-26 DIAGNOSIS — Z515 Encounter for palliative care: Secondary | ICD-10-CM | POA: Diagnosis not present

## 2014-10-26 DIAGNOSIS — R6251 Failure to thrive (child): Secondary | ICD-10-CM

## 2014-10-26 DIAGNOSIS — J181 Lobar pneumonia, unspecified organism: Secondary | ICD-10-CM

## 2014-10-26 DIAGNOSIS — Z886 Allergy status to analgesic agent status: Secondary | ICD-10-CM | POA: Diagnosis not present

## 2014-10-26 DIAGNOSIS — E440 Moderate protein-calorie malnutrition: Secondary | ICD-10-CM | POA: Insufficient documentation

## 2014-10-26 LAB — URINALYSIS, ROUTINE W REFLEX MICROSCOPIC
Bilirubin Urine: NEGATIVE
Glucose, UA: NEGATIVE mg/dL
HGB URINE DIPSTICK: NEGATIVE
KETONES UR: 40 mg/dL — AB
Nitrite: NEGATIVE
PH: 6.5 (ref 5.0–8.0)
PROTEIN: NEGATIVE mg/dL
Specific Gravity, Urine: 1.012 (ref 1.005–1.030)
Urobilinogen, UA: 0.2 mg/dL (ref 0.0–1.0)

## 2014-10-26 LAB — CBC WITH DIFFERENTIAL/PLATELET
Basophils Absolute: 0.1 10*3/uL (ref 0.0–0.1)
Basophils Relative: 1 % (ref 0–1)
Eosinophils Absolute: 0.2 10*3/uL (ref 0.0–0.7)
Eosinophils Relative: 2 % (ref 0–5)
HEMATOCRIT: 41.4 % (ref 36.0–46.0)
Hemoglobin: 11.9 g/dL — ABNORMAL LOW (ref 12.0–15.0)
LYMPHS PCT: 10 % — AB (ref 12–46)
Lymphs Abs: 1.2 10*3/uL (ref 0.7–4.0)
MCH: 28.8 pg (ref 26.0–34.0)
MCHC: 28.7 g/dL — AB (ref 30.0–36.0)
MCV: 100.2 fL — AB (ref 78.0–100.0)
MONO ABS: 0.6 10*3/uL (ref 0.1–1.0)
Monocytes Relative: 5 % (ref 3–12)
Neutro Abs: 10.5 10*3/uL — ABNORMAL HIGH (ref 1.7–7.7)
Neutrophils Relative %: 82 % — ABNORMAL HIGH (ref 43–77)
Platelets: 243 10*3/uL (ref 150–400)
RBC: 4.13 MIL/uL (ref 3.87–5.11)
RDW: 16.7 % — AB (ref 11.5–15.5)
WBC: 12.6 10*3/uL — ABNORMAL HIGH (ref 4.0–10.5)

## 2014-10-26 LAB — COMPREHENSIVE METABOLIC PANEL
ALK PHOS: 63 U/L (ref 38–126)
ALT: 24 U/L (ref 14–54)
ANION GAP: 16 — AB (ref 5–15)
AST: 33 U/L (ref 15–41)
Albumin: 3.1 g/dL — ABNORMAL LOW (ref 3.5–5.0)
BILIRUBIN TOTAL: 1 mg/dL (ref 0.3–1.2)
BUN: 26 mg/dL — AB (ref 6–20)
CALCIUM: 9.9 mg/dL (ref 8.9–10.3)
CO2: 22 mmol/L (ref 22–32)
Chloride: 126 mmol/L — ABNORMAL HIGH (ref 101–111)
Creatinine, Ser: 1.4 mg/dL — ABNORMAL HIGH (ref 0.44–1.00)
GFR calc Af Amer: 42 mL/min — ABNORMAL LOW (ref >60)
GFR, EST NON AFRICAN AMERICAN: 36 mL/min — AB (ref >60)
Glucose, Bld: 65 mg/dL (ref 65–99)
Potassium: 4 mmol/L (ref 3.5–5.1)
Sodium: 164 mmol/L (ref 135–145)
Total Protein: 6.2 g/dL — ABNORMAL LOW (ref 6.5–8.1)

## 2014-10-26 LAB — URINE MICROSCOPIC-ADD ON

## 2014-10-26 LAB — I-STAT CG4 LACTIC ACID, ED: Lactic Acid, Venous: 0.92 mmol/L (ref 0.5–2.0)

## 2014-10-26 LAB — TROPONIN I
Troponin I: 0.04 ng/mL — ABNORMAL HIGH (ref <0.031)
Troponin I: 0.03 ng/mL (ref <0.031)

## 2014-10-26 LAB — MRSA PCR SCREENING: MRSA by PCR: POSITIVE — AB

## 2014-10-26 LAB — LITHIUM LEVEL: LITHIUM LVL: 0.63 mmol/L (ref 0.60–1.20)

## 2014-10-26 MED ORDER — PERPHENAZINE 4 MG PO TABS
6.0000 mg | ORAL_TABLET | Freq: Every day | ORAL | Status: DC
  Filled 2014-10-26: qty 1

## 2014-10-26 MED ORDER — WARFARIN SODIUM 3 MG PO TABS: 3.5000 mg | ORAL_TABLET | Freq: Every day | ORAL | Status: DC

## 2014-10-26 MED ORDER — LEVOTHYROXINE SODIUM 25 MCG PO TABS
150.0000 ug | ORAL_TABLET | Freq: Every day | ORAL | Status: DC
  Filled 2014-10-26: qty 2
  Filled 2014-10-26: qty 1

## 2014-10-26 MED ORDER — FERROUS SULFATE 325 (65 FE) MG PO TABS
325.0000 mg | ORAL_TABLET | Freq: Every day | ORAL | Status: DC
  Administered 2014-10-28 – 2014-11-03 (×7): 325 mg via ORAL
  Filled 2014-10-26 (×9): qty 1

## 2014-10-26 MED ORDER — ASPIRIN 81 MG PO CHEW: 81.0000 mg | CHEWABLE_TABLET | Freq: Every day | ORAL | Status: DC

## 2014-10-26 MED ORDER — AMLODIPINE BESYLATE 5 MG PO TABS
5.0000 mg | ORAL_TABLET | Freq: Every day | ORAL | Status: DC
  Administered 2014-10-28 – 2014-10-31 (×3): 5 mg via ORAL
  Filled 2014-10-26 (×4): qty 1

## 2014-10-26 MED ORDER — VITAMIN B-1 100 MG PO TABS
100.0000 mg | ORAL_TABLET | Freq: Every day | ORAL | Status: DC
  Administered 2014-10-28 – 2014-11-03 (×7): 100 mg via ORAL
  Filled 2014-10-26 (×10): qty 1

## 2014-10-26 MED ORDER — CEREFOLIN 6-1-50-5 MG PO TABS: 1.0000 | ORAL_TABLET | Freq: Every day | ORAL | Status: DC

## 2014-10-26 MED ORDER — SODIUM CHLORIDE 0.45 % IV SOLN
INTRAVENOUS | Status: DC
  Administered 2014-10-26: 21:00:00 via INTRAVENOUS

## 2014-10-26 MED ORDER — VANCOMYCIN HCL IN DEXTROSE 1-5 GM/200ML-% IV SOLN
1000.0000 mg | INTRAVENOUS | Status: AC
  Administered 2014-10-26: 1000 mg via INTRAVENOUS
  Filled 2014-10-26: qty 200

## 2014-10-26 MED ORDER — ASENAPINE MALEATE 5 MG SL SUBL
10.0000 mg | SUBLINGUAL_TABLET | Freq: Two times a day (BID) | SUBLINGUAL | Status: DC
  Administered 2014-10-27 – 2014-10-28 (×2): 10 mg via SUBLINGUAL
  Filled 2014-10-26 (×6): qty 2

## 2014-10-26 MED ORDER — VITAMIN D 1000 UNITS PO TABS: 1000.0000 [IU] | ORAL_TABLET | Freq: Every day | ORAL | Status: DC

## 2014-10-26 MED ORDER — LEVOTHYROXINE SODIUM 100 MCG PO TABS
150.0000 ug | ORAL_TABLET | Freq: Every day | ORAL | Status: DC
Start: 2014-10-27 — End: 2014-10-26

## 2014-10-26 MED ORDER — B COMPLEX-C PO TABS
1.0000 | ORAL_TABLET | Freq: Every day | ORAL | Status: DC
  Administered 2014-10-28 – 2014-11-03 (×7): 1 via ORAL
  Filled 2014-10-26 (×14): qty 1

## 2014-10-26 MED ORDER — FOLIC ACID 1 MG PO TABS
1.0000 mg | ORAL_TABLET | Freq: Every day | ORAL | Status: DC
  Administered 2014-10-28 – 2014-11-03 (×7): 1 mg via ORAL
  Filled 2014-10-26 (×10): qty 1

## 2014-10-26 MED ORDER — RISAQUAD PO CAPS
1.0000 | ORAL_CAPSULE | Freq: Every day | ORAL | Status: DC
  Administered 2014-10-28 – 2014-11-03 (×7): 1 via ORAL
  Filled 2014-10-26 (×11): qty 1

## 2014-10-26 MED ORDER — PERPHENAZINE 2 MG PO TABS
2.0000 mg | ORAL_TABLET | Freq: Two times a day (BID) | ORAL | Status: DC
  Filled 2014-10-26: qty 1

## 2014-10-26 MED ORDER — CHLORHEXIDINE GLUCONATE CLOTH 2 % EX PADS
6.0000 | MEDICATED_PAD | Freq: Every day | CUTANEOUS | Status: AC
  Administered 2014-10-27 – 2014-10-31 (×5): 6 via TOPICAL

## 2014-10-26 MED ORDER — DEXTROSE 5 % IV SOLN
1.0000 g | Freq: Two times a day (BID) | INTRAVENOUS | Status: DC
  Administered 2014-10-27 – 2014-10-30 (×8): 1 g via INTRAVENOUS
  Filled 2014-10-26 (×10): qty 1

## 2014-10-26 MED ORDER — ACETAMINOPHEN 650 MG RE SUPP: 650.0000 mg | Freq: Four times a day (QID) | RECTAL | Status: DC | PRN

## 2014-10-26 MED ORDER — PANTOPRAZOLE SODIUM 40 MG PO PACK
40.0000 mg | PACK | Freq: Every day | ORAL | Status: DC
  Administered 2014-10-28 – 2014-11-03 (×7): 40 mg via ORAL
  Filled 2014-10-26 (×11): qty 20

## 2014-10-26 MED ORDER — VANCOMYCIN HCL IN DEXTROSE 1-5 GM/200ML-% IV SOLN
1000.0000 mg | INTRAVENOUS | Status: DC
Start: 2014-10-27 — End: 2014-10-29
  Administered 2014-10-27 – 2014-10-28 (×2): 1000 mg via INTRAVENOUS
  Filled 2014-10-26 (×2): qty 200

## 2014-10-26 MED ORDER — CARBAMAZEPINE ER 200 MG PO TB12
200.0000 mg | ORAL_TABLET | Freq: Every day | ORAL | Status: DC
  Filled 2014-10-26: qty 1

## 2014-10-26 MED ORDER — ASPIRIN 81 MG PO CHEW
81.0000 mg | CHEWABLE_TABLET | Freq: Every day | ORAL | Status: DC
  Administered 2014-10-28 – 2014-11-03 (×7): 81 mg via ORAL
  Filled 2014-10-26 (×9): qty 1

## 2014-10-26 MED ORDER — ALBUTEROL SULFATE (2.5 MG/3ML) 0.083% IN NEBU: 2.5000 mg | INHALATION_SOLUTION | RESPIRATORY_TRACT | Status: DC | PRN

## 2014-10-26 MED ORDER — BACID PO TABS
1.0000 | ORAL_TABLET | Freq: Two times a day (BID) | ORAL | Status: DC
  Filled 2014-10-26: qty 1

## 2014-10-26 MED ORDER — ACETAMINOPHEN 325 MG PO TABS
650.0000 mg | ORAL_TABLET | Freq: Four times a day (QID) | ORAL | Status: DC | PRN
  Administered 2014-10-30 – 2014-11-01 (×5): 650 mg via ORAL
  Filled 2014-10-26 (×5): qty 2

## 2014-10-26 MED ORDER — VITAMIN D 1000 UNITS PO TABS
1000.0000 [IU] | ORAL_TABLET | Freq: Every day | ORAL | Status: DC
  Administered 2014-10-28 – 2014-11-03 (×7): 1000 [IU] via ORAL
  Filled 2014-10-26 (×9): qty 1

## 2014-10-26 MED ORDER — MUPIROCIN 2 % EX OINT
1.0000 "application " | TOPICAL_OINTMENT | Freq: Two times a day (BID) | CUTANEOUS | Status: AC
  Administered 2014-10-26 – 2014-10-31 (×10): 1 via NASAL
  Filled 2014-10-26 (×2): qty 22

## 2014-10-26 MED ORDER — HEPARIN SODIUM (PORCINE) 5000 UNIT/ML IJ SOLN: 5000.0000 [IU] | Freq: Three times a day (TID) | INTRAMUSCULAR | Status: DC

## 2014-10-26 MED ORDER — ADULT MULTIVITAMIN W/MINERALS CH
1.0000 | ORAL_TABLET | Freq: Every day | ORAL | Status: DC
  Administered 2014-10-28 – 2014-11-03 (×7): 1 via ORAL
  Filled 2014-10-26 (×9): qty 1

## 2014-10-26 MED ORDER — DEXTROSE 5 % IV SOLN
1.0000 g | Freq: Once | INTRAVENOUS | Status: AC
  Administered 2014-10-26: 1 g via INTRAVENOUS
  Filled 2014-10-26: qty 1

## 2014-10-26 MED ORDER — MEMANTINE HCL 10 MG PO TABS
5.0000 mg | ORAL_TABLET | Freq: Two times a day (BID) | ORAL | Status: DC
  Administered 2014-10-27 – 2014-10-30 (×5): 5 mg via ORAL
  Filled 2014-10-26 (×6): qty 1

## 2014-10-26 MED ORDER — SODIUM CHLORIDE 0.9 % IJ SOLN
3.0000 mL | Freq: Two times a day (BID) | INTRAMUSCULAR | Status: DC
  Administered 2014-10-29 – 2014-11-03 (×6): 3 mL via INTRAVENOUS

## 2014-10-26 MED ORDER — PERPHENAZINE 2 MG PO TABS: 2.0000 mg | ORAL_TABLET | Freq: Three times a day (TID) | ORAL | Status: DC

## 2014-10-26 NOTE — Progress Notes (Addendum)
MEDICATION RELATED CONSULT NOTE - INITIAL   Pharmacy Consult for assistance with medication reconciliation  Medical History: Past Medical History  Diagnosis Date  . Hypertension   . Bipolar 1 disorder   . Hypothyroidism   . Memory difficulties   . HLD (hyperlipidemia)   . Diverticulitis large intestine   . Pneumonia     Assessment: Dr Welton FlakesKhan asked pharmacy to assist with medication reconciliation.  Pharmacy technician completed the medication history and documented all of the patient's current medications documented on nursing home medication administration guide (MAG).  The patient's chart in [facility] contained multiple medications that the patient was no longer taking.  As part of the medication history process, the technician appropriately documented these medications as "Patient no longer taking. Please Consider Medication Status and Discontinue" but some of these medications were resumed inpatient.  Listed below are the medications that patient is currently taking per nursing home MAR:  Amlodipine 5 mg daily - resumed inpatient Asenaprine 10 mg BID - resumed inpatient Enlyte (dietary management product) 1 tablet daily Ferrous sulfate 325 mg daily - resumed inpatient Cefefolin (B-complex) 1 tablet daily - resumed inpatient Lithium carbonate 300 mg BID - held, level pending, noted elevated SCr Namenda 5 mg BID - resumed inpatient  Plan:  After discussion with Dr. Welton FlakesKhan, pharmacy will remove the following inpatient medications orders for medications that the patient is currently not taking PTA:  Tegretol XR 200 mg daily Perphenazine (Trilafon) 2 mg BID  The following medications were marked as "Patient no longer taking" but MD wishes to continue so these orders will remain active: Aspirin 81 mg daily Levothyroxine 150 mg daily Cholecalciferol 1000 units daily  Please contact pharmacy department if need further assistance.  Clance BollRunyon, Sabian Kuba 10/26/2014,9:14 PM

## 2014-10-26 NOTE — H&P (Addendum)
[facility] History and Physical  Delsy Etzkorn WUJ:[id number] DOB: December 31, 1940 DOA: 10/26/2014  Referring physician: Hebert Soho, PA PCP: Astrid Divine, MD   Chief Complaint: Abnormal Labs  HPI: [name] [name] is a 74 y.o. female with dementia HTN hyperlipidemia presents with decreased responsiveness and abnormal labs. Unfortunately patient is not able to provide a history. She is a resident of a SNF and had been noted to not be her usual self for the last several days. Patient apparently is normally active to the point of being combative at baseline. She has been more somnolent and not as active per the SNF. Patient had labowrk done at the facility and was noted to have a sodium of 167. She has also been noted to have poor PO intake and has not been drinking much in the way of fluids. In teh ED she had repeat labs done and her sodium here was confirmed at 164. She also was noted to have a bump in her creatinine.   Review of Systems:  Patient is not able to provide a ROS.  Past Medical History  Diagnosis Date  . Hypertension   . Bipolar 1 disorder   . Hypothyroidism   . Memory difficulties   . HLD (hyperlipidemia)   . Diverticulitis large intestine   . Pneumonia    Past Surgical History  Procedure Laterality Date  . No past surgeries     Social History:  reports that she has never smoked. She has never used smokeless tobacco. She reports that she does not drink alcohol or use illicit drugs.  Allergies  Allergen Reactions  . Antihistamine Decongestant [Triprolidine-Pse] Diarrhea  . Triprolidine-Pseudoephedrine Diarrhea  . Codeine Other (See Comments) and Rash    Gi upset Gi upset     Family History  Problem Relation Age of Onset  . Heart disease Father      Prior to Admission medications   Medication Sig Start Date End Date Taking? Authorizing Provider  amLODipine (NORVASC) 5 MG tablet Take 5 mg by mouth daily with breakfast.    Yes Historical Provider,  MD  Asenapine Maleate 10 MG SUBL Place 10 mg under the tongue 2 (two) times daily. 10/19/14 11/18/14 Yes Historical Provider, MD  Dietary Management Product (ENLYTE PO) Take 1 tablet by mouth daily.   Yes Historical Provider, MD  ferrous sulfate 325 (65 FE) MG tablet Take 325 mg by mouth daily with breakfast.   Yes Historical Provider, MD  L-Methylfolate-B12-B6-B2 (CEREFOLIN PO) Take 1 tablet by mouth daily.   Yes Historical Provider, MD  lithium carbonate 300 MG capsule Take 300 mg by mouth 2 (two) times daily. 10/19/14  Yes Historical Provider, MD  memantine (NAMENDA) 5 MG tablet Take 5 mg by mouth 2 (two) times daily.   Yes Historical Provider, MD  warfarin (COUMADIN) 1 MG tablet Take 3.5 mg by mouth daily. 1700   Yes Historical Provider, MD  acetaminophen (TYLENOL) 160 MG/5ML solution Take 20.3 mLs (650 mg total) by mouth every 6 (six) hours as needed for mild pain, headache or fever. Patient not taking: Reported on 07/26/2014 06/25/14   Alison Murray, MD  albuterol (PROVENTIL) (2.5 MG/3ML) 0.083% nebulizer solution Take 3 mLs (2.5 mg total) by nebulization every 4 (four) hours as needed for wheezing. Patient not taking: Reported on 09/22/2014 06/25/14   Alison Murray, MD  antiseptic oral rinse (BIOTENE) LIQD 15 mLs by Mouth Rinse route as needed for dry mouth.    Historical Provider, MD  aspirin 81 MG  chewable tablet Chew 1 tablet (81 mg total) by mouth daily. Patient not taking: Reported on 09/22/2014 06/25/14   Alison Murray, MD  b complex vitamins capsule Take 1 capsule by mouth daily with breakfast.    Historical Provider, MD  carbamazepine (TEGRETOL XR) 200 MG 12 hr tablet Take 200 mg by mouth at bedtime.    Historical Provider, MD  cholecalciferol (VITAMIN D) 1000 UNITS tablet Take 1,000 Units by mouth daily with breakfast.    Historical Provider, MD  feeding supplement, RESOURCE BREEZE, (RESOURCE BREEZE) LIQD Take 1 Container by mouth 3 (three) times daily between meals. Patient not taking:  Reported on 07/26/2014 06/25/14   Alison Murray, MD  furosemide (LASIX) 20 MG tablet Take 1 tablet (20 mg total) by mouth daily. Patient not taking: Reported on 09/22/2014 08/06/14   Rhetta Mura, MD  lactobacillus acidophilus (BACID) TABS tablet Take 1 tablet by mouth 2 (two) times daily.    Historical Provider, MD  lamoTRIgine (LAMICTAL) 25 MG tablet Take 2 tablets (50 mg total) by mouth 2 (two) times daily. Patient not taking: Reported on 09/22/2014 08/06/14   Rhetta Mura, MD  levothyroxine (SYNTHROID, LEVOTHROID) 150 MCG tablet Take 150 mcg by mouth daily before breakfast.    Historical Provider, MD  LORazepam (ATIVAN) 0.5 MG tablet Take 1 tablet (0.5 mg total) by mouth every 6 (six) hours as needed for anxiety (and agitation). Patient not taking: Reported on 10/26/2014 08/06/14   Rhetta Mura, MD  Maltodextrin-Xanthan Gum (RESOURCE THICKENUP CLEAR) POWD Use as indicated. Patient not taking: Reported on 07/26/2014 05/03/14   Kathlen Mody, MD  pantoprazole sodium (PROTONIX) 40 mg/20 mL PACK Take 20 mLs (40 mg total) by mouth daily. Patient not taking: Reported on 07/26/2014 06/25/14   Alison Murray, MD  perphenazine (TRILAFON) 2 MG tablet Take 2-6 mg by mouth 3 (three) times daily. Take 2mg  at 0800 and 1700. Take 6mg  at 2100    Historical Provider, MD  senna (SENOKOT) 8.6 MG TABS tablet Take 1 tablet (8.6 mg total) by mouth 2 (two) times daily. Patient not taking: Reported on 09/22/2014 05/03/14   Kathlen Mody, MD  simvastatin (ZOCOR) 20 MG tablet Take 20 mg by mouth at bedtime.     Historical Provider, MD  traMADol (ULTRAM) 50 MG tablet Take 1 tablet (50 mg total) by mouth every 6 (six) hours as needed (for pain). 08/06/14   Rhetta Mura, MD   Physical Exam: Filed Vitals:   10/26/14 1548 10/26/14 1554  BP:  113/72  Pulse:  84  Temp:  99 F (37.2 C)  TempSrc:  Oral  Resp:  18  SpO2: 90% 96%    Wt Readings from Last 3 Encounters:  08/06/14 60.4 kg (133 lb 2.5 oz)    06/23/14 76.1 kg (167 lb 12.3 oz)  04/26/14 77.7 kg (171 lb 4.8 oz)    General:  Appears calm Eyes: PERRL, normal lids ENT: grossly dry oral mucus membranes Neck: no LAD, masses  Cardiovascular: RRR, no m/r/g. No LE edema. Respiratory: CTA bilaterally, no w/r/r Abdomen: soft, ntnd Skin: no rash or induration seen Musculoskeletal: grossly normal tone BUE/BLE Psychiatric: unable to assess Neurologic: unable to assess.          Labs on Admission:  Basic Metabolic Panel:  Recent Labs Lab 10/26/14 1704  NA 164*  K 4.0  CL 126*  CO2 22  GLUCOSE 65  BUN 26*  CREATININE 1.40*  CALCIUM 9.9   Liver Function Tests:  Recent Labs  Lab 10/26/14 1704  AST 33  ALT 24  ALKPHOS 63  BILITOT 1.0  PROT 6.2*  ALBUMIN 3.1*   No results for input(s): LIPASE, AMYLASE in the last 168 hours. No results for input(s): AMMONIA in the last 168 hours. CBC:  Recent Labs Lab 10/26/14 1704  WBC 12.6*  NEUTROABS 10.5*  HGB 11.9*  HCT 41.4  MCV 100.2*  PLT 243   Cardiac Enzymes:  Recent Labs Lab 10/26/14 1704  TROPONINI 0.04*    BNP (last 3 results) No results for input(s): BNP in the last 8760 hours.  ProBNP (last 3 results) No results for input(s): PROBNP in the last 8760 hours.  CBG: No results for input(s): GLUCAP in the last 168 hours.  Radiological Exams on Admission: Ct Head Wo Contrast  10/26/2014   CLINICAL DATA:  Altered mental status with lethargy and hypernatremia  EXAM: CT HEAD WITHOUT CONTRAST  TECHNIQUE: Contiguous axial images were obtained from the base of the skull through the vertex without intravenous contrast.  COMPARISON:  September 22, 2014  FINDINGS: There is age related volume loss. There is no intracranial mass, hemorrhage, extra-axial fluid collection, or midline shift. There is slight small vessel disease adjacent to the frontal horns of the lateral ventricles. Elsewhere gray-white compartments appear normal. No acute infarct is apparent. The bony  calvarium appears intact. The mastoid air cells are clear. There is debris in the left external auditory canal.  IMPRESSION: Minimal periventricular small vessel disease. No intracranial mass, hemorrhage, or acute appearing infarct. There is probable cerumen in the left external auditory canal.   Electronically Signed   By: Bretta BangWilliam  Woodruff III M.D.   On: 10/26/2014 17:27   Dg Chest Port 1 View  10/26/2014   CLINICAL DATA:  Altered mental status, height burden a tree me of  EXAM: PORTABLE CHEST - 1 VIEW  COMPARISON:  Portable chest x-ray of August 23, 2014  FINDINGS: The lungs are adequately inflated. There are mildly increased lung markings in the retrocardiac region. The left hemidiaphragm is partially obscured. The cardiac silhouette and pulmonary vascularity are normal. The bony thorax exhibits no acute abnormality.  IMPRESSION: Findings consistent with atelectasis or pneumonia in the left lower lobe. There is no CHF.   Electronically Signed   By: David  SwazilandJordan M.D.   On: 10/26/2014 17:29      Assessment/Plan Principal Problem:   Hypernatremia Active Problems:   Acute kidney injury   Dehydration   Essential hypertension   Dyslipidemia   Dementia   LLL pneumonia   1. Hypernatremia -she is clinically dehydrated -will start on IVF now and monitor labs -hold all diuretics  2. LLL pneumonia -on her CXR she has possible LLL infiltrate noted -will start on empiric antibiotics to cover HCAP Cefepime and Vancocin per pharmacy  3. Dehydration/AKI -as noted above bump in creatinine likely due to dehydration related to poor intake -will hydrate and monitor labs  4. Hypertension -currently normotensive -will monitor pressures -on amlodipine  5. Hyperlipidemia -will check lipid panel  6. Dementia -will monitor -on namenda  7. Anemia -macrocytosis noted -will check iron studies and B12 and Folate -will check TSH -on iron  8. Hypothyroid -will check TSH -continue with  synthroid  9. Bipolar -will check Lithium levels prior to resuming  10. Coumadin -not clear why she is on coumadin last dose per SNF was yesterday -will check INR  11. Elevated Troponin -will cycle enzymes  Due to Complex nature of her medications will get a  Pharmacy consult to reconcile her medications.  Code Status: Full Code DVT Prophylaxis:Heparin Family Communication: None Disposition Plan: SNF  Time spent:  [facility] Pager [phone]

## 2014-10-26 NOTE — ED Notes (Signed)
Hospital phlebotomy paged to draw blood. 

## 2014-10-26 NOTE — ED Provider Notes (Signed)
CSN: [id number]     Arrival date & time 10/26/14  1539 History   None    Chief Complaint  Patient presents with  . Altered Mental Status     (Consider location/radiation/quality/duration/timing/severity/associated sxs/prior Treatment) Patient is a 74 y.o. female presenting with altered mental status. The history is provided by the nursing home. No language interpreter was used.  Altered Mental Status [name] [name] is a 74 y.o female with a history of HTN, hypothyroidism, hyperlipidemia, and dementia who by EMS [facility] [place] for AMS x 5-6 days.  The nurse called the facility where she stays and she is verbal but incoherent at baseline secondary to dementia.  She is normally combative and moves her upper extremities also.  For the last 5-6 days she has been laying and staring without making any noise, lethargic and non responsive to painful stimuli. She has also stopped eating and drinking for the past week. They did labs on her and she was hypernatremic at 167 which is what prompted them to send her to the ED.   Past Medical History  Diagnosis Date  . Hypertension   . Bipolar 1 disorder   . Hypothyroidism   . Memory difficulties   . HLD (hyperlipidemia)   . Diverticulitis large intestine   . Pneumonia    Past Surgical History  Procedure Laterality Date  . No past surgeries     Family History  Problem Relation Age of Onset  . Heart disease Father    History  Substance Use Topics  . Smoking status: Never Smoker   . Smokeless tobacco: Never Used  . Alcohol Use: No   OB History    No data available     Review of Systems  Unable to perform ROS     Allergies  Antihistamine decongestant; Triprolidine-pseudoephedrine; and Codeine  Home Medications   Prior to Admission medications   Medication Sig Start Date End Date Taking? Authorizing Provider  amLODipine (NORVASC) 5 MG tablet Take 5 mg by mouth daily with breakfast.    Yes Historical  Provider, MD  Asenapine Maleate 10 MG SUBL Place 10 mg under the tongue 2 (two) times daily. 10/19/14 11/18/14 Yes Historical Provider, MD  Dietary Management Product (ENLYTE PO) Take 1 tablet by mouth daily.   Yes Historical Provider, MD  ferrous sulfate 325 (65 FE) MG tablet Take 325 mg by mouth daily with breakfast.   Yes Historical Provider, MD  L-Methylfolate-B12-B6-B2 (CEREFOLIN PO) Take 1 tablet by mouth daily.   Yes Historical Provider, MD  lithium carbonate 300 MG capsule Take 300 mg by mouth 2 (two) times daily. 10/19/14  Yes Historical Provider, MD  memantine (NAMENDA) 5 MG tablet Take 5 mg by mouth 2 (two) times daily.   Yes Historical Provider, MD  warfarin (COUMADIN) 1 MG tablet Take 3.5 mg by mouth daily. 1700   Yes Historical Provider, MD  acetaminophen (TYLENOL) 160 MG/5ML solution Take 20.3 mLs (650 mg total) by mouth every 6 (six) hours as needed for mild pain, headache or fever. Patient not taking: Reported on 07/26/2014 06/25/14   Alison MurrayAlma M Devine, MD  albuterol (PROVENTIL) (2.5 MG/3ML) 0.083% nebulizer solution Take 3 mLs (2.5 mg total) by nebulization every 4 (four) hours as needed for wheezing. Patient not taking: Reported on 09/22/2014 06/25/14   Alison MurrayAlma M Devine, MD  antiseptic oral rinse (BIOTENE) LIQD 15 mLs by Mouth Rinse route as needed for dry mouth.    Historical Provider, MD  aspirin 81  MG chewable tablet Chew 1 tablet (81 mg total) by mouth daily. Patient not taking: Reported on 09/22/2014 06/25/14   Alison Murray, MD  b complex vitamins capsule Take 1 capsule by mouth daily with breakfast.    Historical Provider, MD  carbamazepine (TEGRETOL XR) 200 MG 12 hr tablet Take 200 mg by mouth at bedtime.    Historical Provider, MD  cholecalciferol (VITAMIN D) 1000 UNITS tablet Take 1,000 Units by mouth daily with breakfast.    Historical Provider, MD  feeding supplement, RESOURCE BREEZE, (RESOURCE BREEZE) LIQD Take 1 Container by mouth 3 (three) times daily between meals. Patient not  taking: Reported on 07/26/2014 06/25/14   Alison Murray, MD  furosemide (LASIX) 20 MG tablet Take 1 tablet (20 mg total) by mouth daily. Patient not taking: Reported on 09/22/2014 08/06/14   Rhetta Mura, MD  lactobacillus acidophilus (BACID) TABS tablet Take 1 tablet by mouth 2 (two) times daily.    Historical Provider, MD  lamoTRIgine (LAMICTAL) 25 MG tablet Take 2 tablets (50 mg total) by mouth 2 (two) times daily. Patient not taking: Reported on 09/22/2014 08/06/14   Rhetta Mura, MD  levothyroxine (SYNTHROID, LEVOTHROID) 150 MCG tablet Take 150 mcg by mouth daily before breakfast.    Historical Provider, MD  LORazepam (ATIVAN) 0.5 MG tablet Take 1 tablet (0.5 mg total) by mouth every 6 (six) hours as needed for anxiety (and agitation). Patient not taking: Reported on 10/26/2014 08/06/14   Rhetta Mura, MD  Maltodextrin-Xanthan Gum (RESOURCE THICKENUP CLEAR) POWD Use as indicated. Patient not taking: Reported on 07/26/2014 05/03/14   Kathlen Mody, MD  pantoprazole sodium (PROTONIX) 40 mg/20 mL PACK Take 20 mLs (40 mg total) by mouth daily. Patient not taking: Reported on 07/26/2014 06/25/14   Alison Murray, MD  perphenazine (TRILAFON) 2 MG tablet Take 2-6 mg by mouth 3 (three) times daily. Take  at 0800 and 1700. Take  at 2100    Historical Provider, MD  senna (SENOKOT) 8.6 MG TABS tablet Take 1 tablet (8.6 mg total) by mouth 2 (two) times daily. Patient not taking: Reported on 09/22/2014 05/03/14   Kathlen Mody, MD  simvastatin (ZOCOR) 20 MG tablet Take 20 mg by mouth at bedtime.     Historical Provider, MD  traMADol (ULTRAM) 50 MG tablet Take 1 tablet (50 mg total) by mouth every 6 (six) hours as needed (for pain). 08/06/14   Rhetta Mura, MD   BP 122/67 mmHg  Pulse 96  Temp(Src) 97.7 F (36.5 C) (Oral)  Resp 18  Ht  (1.676 m)  Wt 128 lb 4.9 oz (58.2 kg)  BMI 20.72 kg/m2  SpO2 94% Physical Exam  Constitutional: Vital signs are normal. She appears  well-developed and well-nourished. She appears lethargic.  HENT:  Head: Normocephalic and atraumatic.  Eyes: Conjunctivae are normal.  Neck: Normal range of motion. Neck supple.  Cardiovascular: Normal rate, regular rhythm and normal heart sounds.   Pulmonary/Chest: Effort normal. No respiratory distress.  Abdominal: Soft. There is no tenderness.  Musculoskeletal: She exhibits no edema.  Neurological: She appears lethargic.  aphasia  Skin: Skin is warm and dry.  Nursing note and vitals reviewed.   ED Course  Procedures (including critical care time) Labs Review Labs Reviewed  MRSA PCR SCREENING - Abnormal; Notable for the following:    MRSA by PCR POSITIVE (*)    All other components within normal limits  CBC WITH DIFFERENTIAL/PLATELET - Abnormal; Notable for the following:    WBC 12.6 (*)  Hemoglobin 11.9 (*)    MCV 100.2 (*)    MCHC 28.7 (*)    RDW 16.7 (*)    Neutrophils Relative % 82 (*)    Neutro Abs 10.5 (*)    Lymphocytes Relative 10 (*)    All other components within normal limits  COMPREHENSIVE METABOLIC PANEL - Abnormal; Notable for the following:    Sodium 164 (*)    Chloride 126 (*)    BUN 26 (*)    Creatinine, Ser 1.40 (*)    Total Protein 6.2 (*)    Albumin 3.1 (*)    GFR calc non Af Amer 36 (*)    GFR calc Af Amer 42 (*)    Anion gap 16 (*)    All other components within normal limits  URINALYSIS, ROUTINE W REFLEX MICROSCOPIC - Abnormal; Notable for the following:    APPearance CLOUDY (*)    Ketones, ur 40 (*)    Leukocytes, UA LARGE (*)    All other components within normal limits  TROPONIN I - Abnormal; Notable for the following:    Troponin I 0.04 (*)    All other components within normal limits  URINE MICROSCOPIC-ADD ON - Abnormal; Notable for the following:    Bacteria, UA FEW (*)    All other components within normal limits  CULTURE, BLOOD (ROUTINE X 2)  CULTURE, BLOOD (ROUTINE X 2)  CULTURE, BLOOD (ROUTINE X 2)  CULTURE, BLOOD (ROUTINE  X 2)  CULTURE, EXPECTORATED SPUTUM-ASSESSMENT  GRAM STAIN  LITHIUM LEVEL  TROPONIN I  LEGIONELLA ANTIGEN, URINE  STREP PNEUMONIAE URINARY ANTIGEN  TSH  HEMOGLOBIN A1C  CBC  COMPREHENSIVE METABOLIC PANEL  INFLUENZA PANEL BY PCR (TYPE A & B, H1N1)  LIPID PANEL  VITAMIN B12  FOLATE RBC  TROPONIN I  TROPONIN I  PROTIME-INR  PROTIME-INR  I-STAT CG4 LACTIC ACID, ED  I-STAT CG4 LACTIC ACID, ED    Imaging Review Ct Head Wo Contrast  10/26/2014   CLINICAL DATA:  Altered mental status with lethargy and hypernatremia  EXAM: CT HEAD WITHOUT CONTRAST  TECHNIQUE: Contiguous axial images were obtained from the base of the skull through the vertex without intravenous contrast.  COMPARISON:  September 22, 2014  FINDINGS: There is age related volume loss. There is no intracranial mass, hemorrhage, extra-axial fluid collection, or midline shift. There is slight small vessel disease adjacent to the frontal horns of the lateral ventricles. Elsewhere gray-white compartments appear normal. No acute infarct is apparent. The bony calvarium appears intact. The mastoid air cells are clear. There is debris in the left external auditory canal.  IMPRESSION: Minimal periventricular small vessel disease. No intracranial mass, hemorrhage, or acute appearing infarct. There is probable cerumen in the left external auditory canal.   Electronically Signed   By: Bretta Bang III M.D.   On: 10/26/2014 17:27   Dg Chest Port 1 View  10/26/2014   CLINICAL DATA:  Altered mental status, height burden a tree me of  EXAM: PORTABLE CHEST - 1 VIEW  COMPARISON:  Portable chest x-ray of August 23, 2014  FINDINGS: The lungs are adequately inflated. There are mildly increased lung markings in the retrocardiac region. The left hemidiaphragm is partially obscured. The cardiac silhouette and pulmonary vascularity are normal. The bony thorax exhibits no acute abnormality.  IMPRESSION: Findings consistent with atelectasis or pneumonia in the  left lower lobe. There is no CHF.   Electronically Signed   By: David  [place]iland M.D.   On: 10/26/2014 17:29  EKG interpretation 10/26/14 6:16pm Vent. rate 112 BPM PR interval 144 ms QRS duration 67 ms QT/QTc 322/439 ms P-R-T axes 51 4 269 Sinus tachycardia Abnormal R-wave progression, early transition Borderline T abnormalities, diffuse leads I have seen and agree with the EKG interpretation, Hebert Soho, PA-C.  MDM   Final diagnoses:  Altered mental status, unspecified altered mental status type  Healthcare-associated pneumonia  Patient presents from nursing home for AMS x 1 week.  She has not been eating or drinking.  She had labs drawn today and was hypernatremic and sent to ED. Patient is a full code.  She was started on Lithium 3 weeks ago. I spoke to Dr. Donnald Garre regarding the patient and she has seen her. Her vitals are normal but her labs are concerning.  She is hypernatremic with elevated BUN and creatinine. She also has a chronically elevated troponin and her chest xray is positive for LLL pneumonia.  Her UA is still pending. I started her on Vancomycin and ceftriaxone for LLL pneumonia.   18:49 I spoke to Dr. Welton Flakes who will see the patient in the ED and admit for hypernatremia, AMS, and pneumonia.     Catha Gosselin, PA-C 10/27/14 0036  Arby Barrette, MD 10/29/14 [phone]

## 2014-10-26 NOTE — ED Notes (Signed)
Unsuccessful blood draw attempt. RN is aware.

## 2014-10-26 NOTE — Progress Notes (Addendum)
ANTIBIOTIC CONSULT NOTE - INITIAL  Pharmacy Consult for Vancomycin, Renal adjustment of antibiotics Indication: pneumonia  Allergies  Allergen Reactions  . Antihistamine Decongestant [Triprolidine-Pse] Diarrhea  . Triprolidine-Pseudoephedrine Diarrhea  . Codeine Other (See Comments) and Rash    Gi upset Gi upset     Patient Measurements:   Adjusted Body Weight:   Vital Signs: Temp: 99 F (37.2 C) (05/24 1554) Temp Source: Oral (05/24 1554) BP: 113/72 mmHg (05/24 1554) Pulse Rate: 84 (05/24 1554) Intake/Output from previous day:   Intake/Output from this shift: Total I/O In: -  Out: 250 [Urine:250]  Labs:  Recent Labs  10/26/14 1704  WBC 12.6*  HGB 11.9*  PLT 243  CREATININE 1.40*   CrCl cannot be calculated (Unknown ideal weight.). No results for input(s): VANCOTROUGH, VANCOPEAK, VANCORANDOM, GENTTROUGH, GENTPEAK, GENTRANDOM, TOBRATROUGH, TOBRAPEAK, TOBRARND, AMIKACINPEAK, AMIKACINTROU, AMIKACIN in the last 72 hours.   Microbiology: No results found for this or any previous visit (from the past 720 hour(s)).  Medical History: Past Medical History  Diagnosis Date  . Hypertension   . Bipolar 1 disorder   . Hypothyroidism   . Memory difficulties   . HLD (hyperlipidemia)   . Diverticulitis large intestine   . Pneumonia      Assessment: [age] yoF presents from NH with AMS, sepsis 2/2 possible HCAP.  CXR in ED showed findings consistent with atelectasis or pneumonia in the left lower lobe.  Cefepime ordered x 1 in ED.  Pharmacy consulted to start vancomycin.  SCr elevated at 1.4, CrCl~33 ml/min Blood cultures ordered.  Goal of Therapy:  Vancomycin trough level 15-20 mcg/ml  Plan:  1.  Vancomycin 1g IV q24h. 2.  Adjust Cefepime from 1g q8h to 1g q12h for CrCl~33 ml/min. 2.  F/u SCr, trough levels as indicated.  Clance BollRunyon, Jovanny Stephanie 10/26/2014,6:09 PM

## 2014-10-26 NOTE — ED Notes (Signed)
Per EMS pt coming from [facility] with c/o altered mental status x approx. 2 weeks, per nursing home report pt was diagnosed with hypernatremia, yesterday Na was critically high 167 and she was started on D5 1/2 NS @ 60 ml/hr. Per Velna HatchetSheila, nurse at nursing home pt has been declining rapidly over the last week, has been extremely lethargic, non responsive even to painful stimuli, staring at one spot, has stopped eating and drinking x 1 week. Pt is full code.

## 2014-10-26 NOTE — ED Notes (Signed)
Pt is a very hard stick, unable to get second set of blood cultures, PA made aware

## 2014-10-26 NOTE — ED Notes (Signed)
Bed: WA07 Expected date:  Expected time:  Means of arrival:  Comments: EMS AMS Sepsis

## 2014-10-27 DIAGNOSIS — E870 Hyperosmolality and hypernatremia: Secondary | ICD-10-CM

## 2014-10-27 DIAGNOSIS — E43 Unspecified severe protein-calorie malnutrition: Secondary | ICD-10-CM | POA: Insufficient documentation

## 2014-10-27 DIAGNOSIS — N179 Acute kidney failure, unspecified: Secondary | ICD-10-CM

## 2014-10-27 DIAGNOSIS — F0390 Unspecified dementia without behavioral disturbance: Secondary | ICD-10-CM

## 2014-10-27 DIAGNOSIS — E440 Moderate protein-calorie malnutrition: Secondary | ICD-10-CM | POA: Insufficient documentation

## 2014-10-27 DIAGNOSIS — E860 Dehydration: Secondary | ICD-10-CM

## 2014-10-27 LAB — BASIC METABOLIC PANEL
ANION GAP: 8 (ref 5–15)
Anion gap: 13 (ref 5–15)
BUN: 26 mg/dL — ABNORMAL HIGH (ref 6–20)
BUN: 24 mg/dL — ABNORMAL HIGH (ref 6–20)
CALCIUM: 9.9 mg/dL (ref 8.9–10.3)
CHLORIDE: 127 mmol/L — AB (ref 101–111)
CO2: 22 mmol/L (ref 22–32)
CO2: 26 mmol/L (ref 22–32)
Calcium: 9.7 mg/dL (ref 8.9–10.3)
Chloride: 125 mmol/L — ABNORMAL HIGH (ref 101–111)
Creatinine, Ser: 1.3 mg/dL — ABNORMAL HIGH (ref 0.44–1.00)
Creatinine, Ser: 1.39 mg/dL — ABNORMAL HIGH (ref 0.44–1.00)
GFR calc Af Amer: 46 mL/min — ABNORMAL LOW (ref >60)
GFR calc Af Amer: 42 mL/min — ABNORMAL LOW (ref >60)
GFR calc non Af Amer: 39 mL/min — ABNORMAL LOW (ref >60)
GFR calc non Af Amer: 36 mL/min — ABNORMAL LOW (ref >60)
GLUCOSE: 73 mg/dL (ref 65–99)
Glucose, Bld: 156 mg/dL — ABNORMAL HIGH (ref 65–99)
Potassium: 3.6 mmol/L (ref 3.5–5.1)
Potassium: 3.4 mmol/L — ABNORMAL LOW (ref 3.5–5.1)
SODIUM: 159 mmol/L — AB (ref 135–145)
Sodium: 162 mmol/L (ref 135–145)

## 2014-10-27 LAB — COMPREHENSIVE METABOLIC PANEL
ALT: 23 U/L (ref 14–54)
AST: 31 U/L (ref 15–41)
Albumin: 2.8 g/dL — ABNORMAL LOW (ref 3.5–5.0)
Alkaline Phosphatase: 61 U/L (ref 38–126)
Anion gap: 18 — ABNORMAL HIGH (ref 5–15)
BUN: 26 mg/dL — ABNORMAL HIGH (ref 6–20)
CHLORIDE: 125 mmol/L — AB (ref 101–111)
CO2: 19 mmol/L — ABNORMAL LOW (ref 22–32)
Calcium: 9.9 mg/dL (ref 8.9–10.3)
Creatinine, Ser: 1.37 mg/dL — ABNORMAL HIGH (ref 0.44–1.00)
GFR, EST AFRICAN AMERICAN: 43 mL/min — AB (ref >60)
GFR, EST NON AFRICAN AMERICAN: 37 mL/min — AB (ref >60)
Glucose, Bld: 76 mg/dL (ref 65–99)
Potassium: 4 mmol/L (ref 3.5–5.1)
Sodium: 162 mmol/L (ref 135–145)
TOTAL PROTEIN: 5.2 g/dL — AB (ref 6.5–8.1)
Total Bilirubin: 1 mg/dL (ref 0.3–1.2)

## 2014-10-27 LAB — LIPID PANEL
Cholesterol: 187 mg/dL (ref 0–200)
HDL: 34 mg/dL — ABNORMAL LOW (ref >40)
LDL Cholesterol: 107 mg/dL — ABNORMAL HIGH (ref 0–99)
Total CHOL/HDL Ratio: 5.5 RATIO
Triglycerides: 230 mg/dL — ABNORMAL HIGH (ref <150)
VLDL: 46 mg/dL — ABNORMAL HIGH (ref 0–40)

## 2014-10-27 LAB — CBC
HEMATOCRIT: 41.4 % (ref 36.0–46.0)
HEMOGLOBIN: 12.2 g/dL (ref 12.0–15.0)
MCH: 29.6 pg (ref 26.0–34.0)
MCHC: 29.5 g/dL — ABNORMAL LOW (ref 30.0–36.0)
MCV: 100.5 fL — ABNORMAL HIGH (ref 78.0–100.0)
PLATELETS: 232 10*3/uL (ref 150–400)
RBC: 4.12 MIL/uL (ref 3.87–5.11)
RDW: 17 % — AB (ref 11.5–15.5)
WBC: 12.8 10*3/uL — ABNORMAL HIGH (ref 4.0–10.5)

## 2014-10-27 LAB — PROTIME-INR
INR: 1.02 (ref 0.00–1.49)
Prothrombin Time: 13.6 seconds (ref 11.6–15.2)

## 2014-10-27 LAB — GLUCOSE, CAPILLARY: Glucose-Capillary: 74 mg/dL (ref 65–99)

## 2014-10-27 LAB — TROPONIN I
Troponin I: 0.04 ng/mL — ABNORMAL HIGH (ref <0.031)
Troponin I: 0.04 ng/mL — ABNORMAL HIGH (ref <0.031)

## 2014-10-27 LAB — TSH: TSH: 0.168 u[IU]/mL — ABNORMAL LOW (ref 0.350–4.500)

## 2014-10-27 LAB — INFLUENZA PANEL BY PCR (TYPE A & B)
H1N1 flu by pcr: NOT DETECTED
Influenza A By PCR: NEGATIVE
Influenza B By PCR: NEGATIVE

## 2014-10-27 LAB — T4, FREE: Free T4: 1.75 ng/dL — ABNORMAL HIGH (ref 0.61–1.12)

## 2014-10-27 LAB — VITAMIN B12: Vitamin B-12: 1623 pg/mL — ABNORMAL HIGH (ref 180–914)

## 2014-10-27 MED ORDER — LEVOTHYROXINE SODIUM 25 MCG PO TABS
175.0000 ug | ORAL_TABLET | Freq: Every day | ORAL | Status: DC
  Administered 2014-10-28: 175 ug via ORAL
  Filled 2014-10-27: qty 1
  Filled 2014-10-27: qty 3

## 2014-10-27 MED ORDER — ENSURE ENLIVE PO LIQD: 237.0000 mL | Freq: Three times a day (TID) | ORAL | Status: DC

## 2014-10-27 MED ORDER — RESOURCE THICKENUP CLEAR PO POWD
ORAL | Status: DC | PRN
  Filled 2014-10-27: qty 125

## 2014-10-27 MED ORDER — DEXTROSE 5 % IV SOLN
INTRAVENOUS | Status: DC
  Administered 2014-10-27 – 2014-10-28 (×2): via INTRAVENOUS

## 2014-10-27 MED ORDER — ONDANSETRON HCL 4 MG/2ML IJ SOLN
4.0000 mg | Freq: Four times a day (QID) | INTRAMUSCULAR | Status: DC | PRN
  Administered 2014-10-27: 4 mg via INTRAVENOUS
  Filled 2014-10-27 (×2): qty 2

## 2014-10-27 MED ORDER — WARFARIN - PHARMACIST DOSING INPATIENT
Freq: Every day | Status: DC
  Administered 2014-11-02: 18:00:00

## 2014-10-27 MED ORDER — WARFARIN SODIUM 5 MG PO TABS
5.0000 mg | ORAL_TABLET | Freq: Once | ORAL | Status: AC
  Administered 2014-10-27: 5 mg via ORAL
  Filled 2014-10-27 (×2): qty 1

## 2014-10-27 MED ORDER — LITHIUM CARBONATE 300 MG PO CAPS
300.0000 mg | ORAL_CAPSULE | Freq: Two times a day (BID) | ORAL | Status: DC
  Administered 2014-10-27: 300 mg via ORAL
  Filled 2014-10-27 (×4): qty 1

## 2014-10-27 NOTE — Progress Notes (Signed)
Re-attempt to give oral medications. Pt refused to take meds stating that her stomach was hurting. Will notify MD (Dr. Blake DivineAkula) Dot Beenark, Yancey Flemingsana Johnson

## 2014-10-27 NOTE — Progress Notes (Addendum)
After pt swallowed a few bites of applesauce mixed with crushed meds and a bite of mashed potatoes she vomited it all back up. Suctioned pt mouth and kept sitting in a high position.   MD made aware.  Gave order to give nausea medication and then retry giving medications in 1 hour.  Will continue to monitor closely.    Pt continued to be nauseous after zofran, made MD aware that no meds other than coumadin were given.

## 2014-10-27 NOTE — Progress Notes (Signed)
[facility].  LPN stated that pt was on puree diet with thickened liquids.  Meds crushed in applesauce.

## 2014-10-27 NOTE — Clinical Social Work Note (Signed)
Clinical Social Work Assessment  Patient Details  Name: [name] [name] MRN: [id number] Date of Birth: 1940-09-16  Date of referral:  10/27/14               Reason for consult:  Discharge Planning                Permission sought to share information with:  Family Supports Permission granted to share information::  No (pt with dementia; disoriented x 4 )  Name::     [name] [name]  Agency::  [name]  Relationship::  son  Contact Information:  [phone]/son  Housing/Transportation Living arrangements for the past 2 months:  Skilled Nursing Facility Source of Information:  Adult Children Patient Interpreter Needed:  None Criminal Activity/Legal Involvement Pertinent to Current Situation/Hospitalization:  No - Comment as needed Significant Relationships:  Adult Children Lives with:  Adult Children Do you feel safe going back to the place where you live?  Yes Need for family participation in patient care:  Yes (Comment) (pt with dementia, disoriented x 4 )  Care giving concerns:  [facility] admitted from [name] where pt is a long term resident. Pt son did not identify any care giving concerns.   Social Worker assessment / plan:  CSW received referral that [name].. CSW reviewed chart and noted that disoriented x 4 and unable to participate in assessment . CSW contacted pt son [name] Hazard(Matthew) via telephone.    CSW confirmed with pt son that [facility] is [name]. Pt son reports patient was unable to meet goals at [name] in the short term rehab at [name] and became a long term resident at [name].  Pt Son agreeable for patient to return [name]. Pt son reports that pt is on waiting list for long term care at [name] and asked that CSW contact [facility] to confirm that pt remains on waiting list for long term care bed.   [facility] and facility checking to confirm that pt on long term  care waiting list.   CSW completed FL2 and [facility] clinicals to [name]. CSW contacted [name] and confirmed that pt can return when medically ready.   CSW to continue to follow and assist with pt discharge needs when medically ready for discharge.    Employment status:  Retired Database administratornsurance information:  [place], [place] Recommendations:  Not assessed at this time Information / Referral to community resources:  Skilled Nursing Facility  Patient/Family's Response to care:  Patient unable to participate in assessment. Patient's son engaged and involved during assessment. Son reports that he wants patient to feel better but feels he has placed patient in the best environment possible for her safety. Son thanked CSW for call and has CSW contact information in case further needs arise.   Patient/Family's Understanding of and Emotional Response to Diagnosis, Current Treatment, and Prognosis:  Pt son knowledgeable about pt diagnosis and treatment plan. Pt son reports that pt has had multiple admissions and pt son coping appropriately with pt current admission.  Emotional Assessment Appearance:  Appears stated age Attitude/Demeanor/Rapport:  Unable to Assess (disoriented x 4) Affect (typically observed):  Unable to Assess Orientation:  Fluctuating Orientation (Suspected and/or reported Sundowners) Alcohol / Substance use:  Not Applicable Psych involvement (Current and /or in the community):  No (Comment)  Discharge Needs  Concerns to be addressed:  Discharge Planning Concerns Readmission  within the last 30 days:  No Current discharge risk:  Dependent with Mobility, Physical Impairment Barriers to Discharge:  Continued Medical Work up   Loletta Specter A, LCSW 10/27/2014, 2:01 PM

## 2014-10-27 NOTE — Evaluation (Signed)
Clinical/Bedside Swallow Evaluation Patient Details  Name: [name] [name] MRN: [id number] Date of Birth: December 28, 1940  Today's Date: 10/27/2014 Time: SLP Start Time (ACUTE ONLY): 1054 SLP Stop Time (ACUTE ONLY): 1126 SLP Time Calculation (min) (ACUTE ONLY): 32 min  Past Medical History:  Past Medical History  Diagnosis Date  . Hypertension   . Bipolar 1 disorder   . Hypothyroidism   . Memory difficulties   . HLD (hyperlipidemia)   . Diverticulitis large intestine   . Pneumonia    Past Surgical History:  Past Surgical History  Procedure Laterality Date  . No past surgeries     HPI:  74 yo female adm to [facility] with decreased responsiveness and hypernatremia.  Found to have left lower lobe pna.  PMH + for dysphagia, bipolar d/o, dementia, HLD, HTN.  Last swallow evaluation completed during February admit with trace aspiration of thin - silent- due to premature spillage into larynx prior to swallow trigger.  Pt reported preferring nectar liquids and pureed foods at that time due to her dysphagia - coughing with liquids and decreased ability to masticate solids due to dentition.  CT head negative.     Assessment / Plan / Recommendation Clinical Impression  Pt with dried secretions, lingual fissures and ? crushed pill adhered to postior soft palate.  Oral suction set up and significant oral care provided by this SlP.  Pt's speech is more dysarthric than during prior admission indicative of oral abilities.  Pt is familiar to this SLP and pt preferred modified diet during last admit-consuming mostly on liquids.   After oral care provided, SLP administered thin via tsp, nectar thick liquids via straw/cup, and applesauce boluses.  Decreased labial seal with left spillage noted with liquids. Delayed swallow but no s/s of aspiration apparent.  Suspect mental status and weakness contributing to dysphagia symptoms at this time.  Recommend initiate dys1/nectar diet with strict precautions.  SlP to follow up  briefly.      Aspiration Risk    Moderate and ongoing   Diet Recommendation Dysphagia 1 (Puree);Nectar   Medication Administration: Crushed with puree Compensations: Slow rate;Small sips/bites;Follow solids with liquid    Other  Recommendations Oral Care Recommendations: Oral care before and after PO   Follow Up Recommendations    tbd   Frequency and Duration min 2x/week  1 week   Pertinent Vitals/Pain Low grade fever, decreased      Swallow Study Prior Functional Status   see hhx    General Date of Onset: 10/27/14 Other Pertinent Information: 74 yo female adm to [facility] with decreased responsiveness and hypernatremia.  Found to have left lower lobe pna.  PMH + for dysphagia, bipolar d/o, dementia, HLD, HTN.  Last swallow evaluation completed during February admit with trace aspiration of thin - silent- due to premature spillage into larynx prior to swallow trigger.  Pt reported preferring nectar liquids and pureed foods at that time due to her dysphagia - coughing with liquids and decreased ability to masticate solids due to dentition.  CT head negative.   Type of Study: Bedside swallow evaluation Diet Prior to this Study: NPO Temperature Spikes Noted: Yes (low grade) Respiratory Status: Room air History of Recent Intubation: No Oral Cavity - Dentition: Edentulous Self-Feeding Abilities: Total assist Patient Positioning: Upright in bed Baseline Vocal Quality: Low vocal intensity;Breathy Volitional Cough: Weak Volitional Swallow: Able to elicit (after oral care)    Oral/Motor/Sensory Function Overall Oral Motor/Sensory Function: Impaired (gross weakness, open mouth posture )   Ice  Chips Ice chips: Not tested   Thin Liquid Thin Liquid: Impaired Presentation: Spoon Oral Phase Impairments: Reduced lingual movement/coordination;Impaired anterior to posterior transit;Reduced labial seal Oral Phase Functional Implications: Prolonged oral transit Pharyngeal  Phase Impairments:  Suspected delayed Swallow    Nectar Thick Nectar Thick Liquid: Impaired Presentation: Cup;Straw Oral Phase Impairments: Reduced lingual movement/coordination;Impaired anterior to posterior transit;Poor awareness of bolus;Reduced labial seal Oral phase functional implications: Prolonged oral transit Pharyngeal Phase Impairments: Suspected delayed Swallow   Honey Thick Honey Thick Liquid: Not tested   Puree Puree: Impaired Presentation: Spoon Oral Phase Impairments: Reduced labial seal;Reduced lingual movement/coordination;Impaired anterior to posterior transit Oral Phase Functional Implications: Prolonged oral transit Pharyngeal Phase Impairments: Suspected delayed Swallow   Solid   GO    Solid: Not tested Other Comments: secondary to level of pt's dysarthia and premorbid diet restriction       Donavan Burnetamara Mohan Erven, MS [facility] SLP [phone]

## 2014-10-27 NOTE — Progress Notes (Signed)
[facility] PROGRESS NOTE  [name] [name] JXB:[id number] DOB: 02-29-1941 DOA: 10/26/2014 PCP: Astrid DivineGRIFFIN,ELAINE COLLINS, MD  Assessment/Plan: 1. Hypernatremia: Possibly from decreased/ none oral intake.  Started on FLUIDS and sodium minimally improved.   2. LLL pneumonia: Started on antibiotics.   Dehydration: Started on IV fluids.   Hypertension: Controlled.   Dementia: no agitation.   Acute renal failure; possibly from dehydration and decreased po intake.     Code Status: full code.  Family Communication: none at bedside Disposition Plan: pending   Consultants:  none  Procedures:  none  Antibiotics:  Dysphagia 1 diet.   HPI/Subjective: Refusing to take anything po.   Objective: Filed Vitals:   10/27/14 1242  BP: 107/71  Pulse: 98  Temp: 97.8 F (36.6 C)  Resp: 19    Intake/Output Summary (Last 24 hours) at 10/27/14 1747 Last data filed at 10/27/14 0500  Gross per 24 hour  Intake 408.33 ml  Output    250 ml  Net 158.33 ml   Filed Weights   10/26/14 2028 10/27/14 0608  Weight: 58.2 kg (128 lb 4.9 oz) 58.3 kg (128 lb 8.5 oz)    Exam:   General:  Alert but confused  Cardiovascular: s1s2  Respiratory: diminished at bases.   Abdomen: soft non tender non distended bowel sounds heard  Musculoskeletal: no pedal edema.   Data Reviewed: Basic Metabolic Panel:  Recent Labs Lab 10/26/14 1704 10/27/14 0245 10/27/14 0900  NA 164* 162* 162*  K 4.0 4.0 3.6  CL 126* 125* 127*  CO2 22 19* 22  GLUCOSE 65 76 73  BUN 26* 26* 26*  CREATININE 1.40* 1.37* 1.30*  CALCIUM 9.9 9.9 9.7   Liver Function Tests:  Recent Labs Lab 10/26/14 1704 10/27/14 0245  AST 33 31  ALT 24 23  ALKPHOS 63 61  BILITOT 1.0 1.0  PROT 6.2* 5.2*  ALBUMIN 3.1* 2.8*   No results for input(s): LIPASE, AMYLASE in the last 168 hours. No results for input(s): AMMONIA in the last 168 hours. CBC:  Recent Labs Lab 10/26/14 1704 10/27/14 0245  WBC 12.6*  12.8*  NEUTROABS 10.5*  --   HGB 11.9* 12.2  HCT 41.4 41.4  MCV 100.2* 100.5*  PLT 243 232   Cardiac Enzymes:  Recent Labs Lab 10/26/14 1704 10/26/14 2115 10/27/14 0245 10/27/14 0855  TROPONINI 0.04* 0.03 0.04* 0.04*   BNP (last 3 results) No results for input(s): BNP in the last 8760 hours.  ProBNP (last 3 results) No results for input(s): PROBNP in the last 8760 hours.  CBG:  Recent Labs Lab 10/27/14 0741  GLUCAP 74    Recent Results (from the past 240 hour(s))  MRSA PCR Screening     Status: Abnormal   Collection Time: 10/26/14  8:51 PM  Result Value Ref Range Status   MRSA by PCR POSITIVE (A) NEGATIVE Final    Comment:        The GeneXpert MRSA Assay (FDA approved for NASAL specimens only), is one component of a comprehensive MRSA colonization surveillance program. It is not intended to diagnose MRSA infection nor to guide or monitor treatment for MRSA infections. RESULT CALLED TO, READ BACK BY AND VERIFIED WITH: J.RIMANDO,RN AT 2222 ON 10/26/14 BY W.SHEA      Studies: Ct Head Wo Contrast  10/26/2014   CLINICAL DATA:  Altered mental status with lethargy and hypernatremia  EXAM: CT HEAD WITHOUT CONTRAST  TECHNIQUE: Contiguous axial images were obtained from the base of the skull through the vertex  without intravenous contrast.  COMPARISON:  September 22, 2014  FINDINGS: There is age related volume loss. There is no intracranial mass, hemorrhage, extra-axial fluid collection, or midline shift. There is slight small vessel disease adjacent to the frontal horns of the lateral ventricles. Elsewhere gray-white compartments appear normal. No acute infarct is apparent. The bony calvarium appears intact. The mastoid air cells are clear. There is debris in the left external auditory canal.  IMPRESSION: Minimal periventricular small vessel disease. No intracranial mass, hemorrhage, or acute appearing infarct. There is probable cerumen in the left external auditory canal.    Electronically Signed   By: Bretta Bang III M.D.   On: 10/26/2014 17:27   Dg Chest Port 1 View  10/26/2014   CLINICAL DATA:  Altered mental status, height burden a tree me of  EXAM: PORTABLE CHEST - 1 VIEW  COMPARISON:  Portable chest x-ray of August 23, 2014  FINDINGS: The lungs are adequately inflated. There are mildly increased lung markings in the retrocardiac region. The left hemidiaphragm is partially obscured. The cardiac silhouette and pulmonary vascularity are normal. The bony thorax exhibits no acute abnormality.  IMPRESSION: Findings consistent with atelectasis or pneumonia in the left lower lobe. There is no CHF.   Electronically Signed   By: David  [place] M.D.   On: 10/26/2014 17:29    Scheduled Meds: . acidophilus  1 capsule Oral Daily  . amLODipine  5 mg Oral Q breakfast  . asenapine  10 mg Sublingual BID  . aspirin  81 mg Oral Daily  . B-complex with vitamin C  1 tablet Oral Q breakfast  . ceFEPime (MAXIPIME) IV  1 g Intravenous Q12H  . Chlorhexidine Gluconate Cloth  6 each Topical Q0600  . cholecalciferol  1,000 Units Oral Q breakfast  . feeding supplement (ENSURE ENLIVE)  237 mL Oral TID BM  . ferrous sulfate  325 mg Oral Q breakfast  . folic acid  1 mg Oral Daily  . levothyroxine  150 mcg Oral QAC breakfast  . memantine  5 mg Oral BID  . multivitamin with minerals  1 tablet Oral Daily  . mupirocin ointment  1 application Nasal BID  . pantoprazole sodium  40 mg Oral Daily  . sodium chloride  3 mL Intravenous Q12H  . thiamine  100 mg Oral Daily  . vancomycin  1,000 mg Intravenous Q24H  . Warfarin - Pharmacist Dosing Inpatient   Does not apply q1800   Continuous Infusions: . dextrose 75 mL/hr at 10/27/14 1247    Principal Problem:   Hypernatremia Active Problems:   Bipolar 1 disorder   Acute kidney injury   Dehydration   Essential hypertension   Dyslipidemia   Dementia   LLL pneumonia   Malnutrition of moderate degree   Protein-calorie malnutrition,  severe    Time spent: 25 minutes.     [facility]  [facility] Pager [phone]. If 7PM-7AM, please contact night-coverage at [email or web address], password [facility] 10/27/2014, 5:47 PM  LOS: 1 day

## 2014-10-27 NOTE — Progress Notes (Signed)
Pharmacy - informational (warfarin indication)  Assessment: 73yoF from SNF admitted for AMS, sepsis possibly d/t HCAP.  SNF MAG indicates warfarin but no indication evident.  Called facility which reported that most recent warfarin prescription was included in discharge orders from [facility] with indication of history of DVT.  Unclear if clot was recent; SNF chart did not contain that information, although patient was not on warfarin at most recent admission on 07/26/14.  Please contact pharmacy or page if further information is required.  Bernadene Personrew Marek Nghiem, PharmD Pager: [phone] 10/27/2014, 9:07 AM

## 2014-10-27 NOTE — Care Management Note (Signed)
Case Management Note  Patient Details  Name: [name] [name] MRN: [id number] Date of Birth: 03-13-1941  Subjective/Objective:                   presents with decreased responsiveness and abnormal labs: Na 164  Action/Plan: Discharge planning  Expected Discharge Date:   (unknown)               Expected Discharge Plan:  Skilled Nursing Facility  In-House Referral:     Discharge planning Services  CM Consult  Post Acute Care Choice:    Choice offered to:     DME Arranged:    DME Agency:     HH Arranged:    HH Agency:     Status of Service:  Completed, signed off  Medicare Important Message Given:    Date Medicare IM Given:    Medicare IM give by:    Date Additional Medicare IM Given:    Additional Medicare Important Message give by:     If discussed at Long Length of Stay Meetings, dates discussed:    Additional Comments: 13:40 CM notes pt from SNF and will likely go back to SNF; CSW aware.  No other CM needs were communicated. [name] [name], [name] Carmen Christine, RN 10/27/2014, 1:39 PM

## 2014-10-27 NOTE — Progress Notes (Signed)
CRITICAL VALUE ALERT  Critical value received: NA 162  Date of notification:  10/27/14  Time of notification:  0550  Critical value read back:Yes.    Nurse who received alert:  J.Ronee Ranganathan  MD notified (1st page):  K.Kirby  Time of first page:  0552  MD notified (2nd page):  Time of second page:  Responding MD:  0600  Time MD responded: K.Kirby

## 2014-10-27 NOTE — Progress Notes (Signed)
CRITICAL VALUE ALERT  Critical value received:  NA  Date of notification:  5/25  Time of notification:  1202  Critical value read back:Yes.    Nurse who received alert:  Debroah LoopKatie Darleth Eustache  MD notified (1st page):  Blake DivineAkula  Time of first page:  1203  MD notified (2nd page):  Time of second page:  Responding MD:  Blake DivineAkula  Time MD responded:  [phone]

## 2014-10-27 NOTE — Progress Notes (Signed)
ANTICOAGULATION CONSULT NOTE - Initial Consult  Pharmacy Consult for Warfarin Indication: ? Unsure of indication  Allergies  Allergen Reactions  . Antihistamine Decongestant [Triprolidine-Pse] Diarrhea  . Triprolidine-Pseudoephedrine Diarrhea  . Codeine Other (See Comments) and Rash    Gi upset Gi upset     Patient Measurements: Height:  (167.6 cm) Weight: 128 lb 4.9 oz (58.2 kg) IBW/kg (Calculated) : 59.3   Vital Signs: Temp: 97.7 F (36.5 C) (05/24 2028) Temp Source: Oral (05/24 2028) BP: 122/67 mmHg (05/24 2028) Pulse Rate: 96 (05/24 2028)  Labs:  Recent Labs  10/26/14 1704 10/26/14 2115 10/27/14 0245  HGB 11.9*  --   --   HCT 41.4  --   --   PLT 243  --   --   LABPROT  --   --  13.6  INR  --   --  1.02  CREATININE 1.40*  --   --   TROPONINI 0.04* 0.03 0.04*    Estimated Creatinine Clearance: 32.4 mL/min (by C-G formula based on Cr of 1.4).   Medical History: Past Medical History  Diagnosis Date  . Hypertension   . Bipolar 1 disorder   . Hypothyroidism   . Memory difficulties   . HLD (hyperlipidemia)   . Diverticulitis large intestine   . Pneumonia     Medications:  Prescriptions prior to admission  Medication Sig Dispense Refill Last Dose  . amLODipine (NORVASC) 5 MG tablet Take 5 mg by mouth daily with breakfast.    10/26/2014 at Unknown time  . Asenapine Maleate 10 MG SUBL Place 10 mg under the tongue 2 (two) times daily.   10/26/2014 at 0900  . Dietary Management Product (ENLYTE PO) Take 1 tablet by mouth daily.   10/26/2014 at Unknown time  . ferrous sulfate 325 (65 FE) MG tablet Take 325 mg by mouth daily with breakfast.   10/26/2014 at Unknown time  . L-Methylfolate-B12-B6-B2 (CEREFOLIN PO) Take 1 tablet by mouth daily.   10/26/2014 at Unknown time  . lithium carbonate 300 MG capsule Take 300 mg by mouth 2 (two) times daily.   10/26/2014 at 0800  . memantine (NAMENDA) 5 MG tablet Take 5 mg by mouth 2 (two) times daily.   10/26/2014 at  Unknown time  . warfarin (COUMADIN) 1 MG tablet Take 3.5 mg by mouth daily. 1700   10/25/2014 at Unknown time  . acetaminophen (TYLENOL) 160 MG/5ML solution Take 20.3 mLs (650 mg total) by mouth every 6 (six) hours as needed for mild pain, headache or fever. (Patient not taking: Reported on 07/26/2014) 120 mL 0   . albuterol (PROVENTIL) (2.5 MG/3ML) 0.083% nebulizer solution Take 3 mLs (2.5 mg total) by nebulization every 4 (four) hours as needed for wheezing. (Patient not taking: Reported on 09/22/2014) 75 mL 12 Unknown  . antiseptic oral rinse (BIOTENE) LIQD 15 mLs by Mouth Rinse route as needed for dry mouth.   unknown  . aspirin 81 MG chewable tablet Chew 1 tablet (81 mg total) by mouth daily. (Patient not taking: Reported on 09/22/2014) 30 tablet 0 07/25/2014 at Unknown time  . b complex vitamins capsule Take 1 capsule by mouth daily with breakfast.   07/26/2014 at Unknown time  . carbamazepine (TEGRETOL XR) 200 MG 12 hr tablet Take 200 mg by mouth at bedtime.   09/21/2014 at Unknown time  . cholecalciferol (VITAMIN D) 1000 UNITS tablet Take 1,000 Units by mouth daily with breakfast.   07/25/2014 at Unknown time  . feeding supplement, RESOURCE  BREEZE, (RESOURCE BREEZE) LIQD Take 1 Container by mouth 3 (three) times daily between meals. (Patient not taking: Reported on 07/26/2014) 1 Container 0   . furosemide (LASIX) 20 MG tablet Take 1 tablet (20 mg total) by mouth daily. (Patient not taking: Reported on 09/22/2014) 30 tablet 0   . lactobacillus acidophilus (BACID) TABS tablet Take 1 tablet by mouth 2 (two) times daily.   07/25/2014 at Unknown time  . lamoTRIgine (LAMICTAL) 25 MG tablet Take 2 tablets (50 mg total) by mouth 2 (two) times daily. (Patient not taking: Reported on 09/22/2014) 120 tablet 0   . levothyroxine (SYNTHROID, LEVOTHROID) 150 MCG tablet Take 150 mcg by mouth daily before breakfast.   09/19/2014  . LORazepam (ATIVAN) 0.5 MG tablet Take 1 tablet (0.5 mg total) by mouth every 6 (six) hours as  needed for anxiety (and agitation). (Patient not taking: Reported on 10/26/2014) 30 tablet 0 unk  . Maltodextrin-Xanthan Gum (RESOURCE THICKENUP CLEAR) POWD Use as indicated. (Patient not taking: Reported on 07/26/2014)   unknown  . pantoprazole sodium (PROTONIX) 40 mg/20 mL PACK Take 20 mLs (40 mg total) by mouth daily. (Patient not taking: Reported on 07/26/2014) 30 each 0   . perphenazine (TRILAFON) 2 MG tablet Take 2-6 mg by mouth 3 (three) times daily. Take 2mg  at 0800 and 1700. Take 6mg  at 2100   09/21/2014 at Unknown time  . senna (SENOKOT) 8.6 MG TABS tablet Take 1 tablet (8.6 mg total) by mouth 2 (two) times daily. (Patient not taking: Reported on 09/22/2014) 120 each 0 07/25/2014 at Unknown time  . simvastatin (ZOCOR) 20 MG tablet Take 20 mg by mouth at bedtime.    07/25/2014 at Unknown time  . traMADol (ULTRAM) 50 MG tablet Take 1 tablet (50 mg total) by mouth every 6 (six) hours as needed (for pain). 30 tablet 0 unk   Scheduled:  . acidophilus  1 capsule Oral Daily  . amLODipine  5 mg Oral Q breakfast  . asenapine  10 mg Sublingual BID  . aspirin  81 mg Oral Daily  . B-complex with vitamin C  1 tablet Oral Q breakfast  . ceFEPime (MAXIPIME) IV  1 g Intravenous Q12H  . Chlorhexidine Gluconate Cloth  6 each Topical Q0600  . cholecalciferol  1,000 Units Oral Q breakfast  . ferrous sulfate  325 mg Oral Q breakfast  . folic acid  1 mg Oral Daily  . levothyroxine  150 mcg Oral QAC breakfast  . memantine  5 mg Oral BID  . multivitamin with minerals  1 tablet Oral Daily  . mupirocin ointment  1 application Nasal BID  . pantoprazole sodium  40 mg Oral Daily  . sodium chloride  3 mL Intravenous Q12H  . thiamine  100 mg Oral Daily  . vancomycin  1,000 mg Intravenous Q24H  . warfarin  5 mg Oral Once  . Warfarin - Pharmacist Dosing Inpatient   Does not apply q1800   Infusions:  . sodium chloride 50 mL/hr at 10/26/14 2050    Assessment: 73 yoF from SNF on warfarin 3.5mg  daily per her MAR,  but could not find an indication from old notes.  Will f/u this am.  LD 5/23, INr=1.02 on admission.  Rx asked to dose Warfarin.   Goal of Therapy:  INR 2-3    Plan:   Warfarin 5mg  x1 this am  Daily PT/INR  Susanne GreenhouseGreen, Corinda Ammon R 10/27/2014,4:49 AM

## 2014-10-27 NOTE — Progress Notes (Signed)
Initial Nutrition Assessment  DOCUMENTATION CODES:  Severe malnutrition in the context of chronic illness as evidenced by 25% wt loss in <6 months and moderate fat and muscle depletion.   INTERVENTION:  Ensure Enlive (each supplement provides 350kcal and 20 grams of protein)- Provide TID between meals- must be thickened to appropriate consistency  NUTRITION DIAGNOSIS:  Inadequate oral intake related to vomiting as evidenced by meal completion < 25%.  GOAL:  Patient will meet greater than or equal to 90% of their needs  MONITOR:  PO intake, Supplement acceptance, Labs, Weight trends  REASON FOR ASSESSMENT:  Malnutrition Screening Tool    ASSESSMENT: 74 y.o. female with dementia HTN hyperlipidemia presents with decreased responsiveness and abnormal labs.  - Pt seen by SLP. Diet upgraded to Dysphagia I with nectar-thickened liquids.  - Per RN, pt projectile vomited after several bites of applesauce with meds. RN to give nausea medication and then retry in 1 hour.  - No breakfast this am due to NPO.  - Per chart, pt with 43 lb wt loss in the past 6 months (25%- significant for time frame.) - RD to add supplements and continue to monitor.  - Labs and medications reviewed   Na 162  Cl 127  BUN 26  Height:  Ht Readings from Last 1 Encounters:  10/26/14 5\' 6"  (1.676 m)    Weight:  Wt Readings from Last 1 Encounters:  10/27/14 128 lb 8.5 oz (58.3 kg)    Ideal Body Weight:  59.1 kg  Wt Readings from Last 10 Encounters:  10/27/14 128 lb 8.5 oz (58.3 kg)  08/06/14 133 lb 2.5 oz (60.4 kg)  06/23/14 167 lb 12.3 oz (76.1 kg)  04/26/14 171 lb 4.8 oz (77.7 kg)    BMI:  Body mass index is 20.75 kg/(m^2).  Estimated Nutritional Needs:  Kcal:  1550-1750  Protein:  75-85 g  Fluid:  1.8 L/day  Skin:  Reviewed, no issues  Diet Order:  DIET - DYS 1 Room service appropriate?: No; Fluid consistency:: Nectar Thick  EDUCATION NEEDS:  Education needs no appropriate at  this time   Intake/Output Summary (Last 24 hours) at 10/27/14 1335 Last data filed at 10/27/14 0500  Gross per 24 hour  Intake 408.33 ml  Output    250 ml  Net 158.33 ml    Last BM:  Prior to admission  Emmaline KluverHaley Nakima Fluegge MS, RD, LDN [phone]

## 2014-10-28 ENCOUNTER — Inpatient Hospital Stay (HOSPITAL_COMMUNITY): Payer: Medicare Other

## 2014-10-28 ENCOUNTER — Encounter (HOSPITAL_COMMUNITY): Payer: Self-pay | Admitting: Radiology

## 2014-10-28 DIAGNOSIS — F319 Bipolar disorder, unspecified: Secondary | ICD-10-CM

## 2014-10-28 LAB — URINALYSIS, ROUTINE W REFLEX MICROSCOPIC
BILIRUBIN URINE: NEGATIVE
Glucose, UA: NEGATIVE mg/dL
Ketones, ur: NEGATIVE mg/dL
NITRITE: NEGATIVE
Protein, ur: NEGATIVE mg/dL
Specific Gravity, Urine: 1.008 (ref 1.005–1.030)
Urobilinogen, UA: 0.2 mg/dL (ref 0.0–1.0)
pH: 6 (ref 5.0–8.0)

## 2014-10-28 LAB — BASIC METABOLIC PANEL
ANION GAP: 8 (ref 5–15)
Anion gap: 5 (ref 5–15)
BUN: 21 mg/dL — AB (ref 6–20)
BUN: 18 mg/dL (ref 6–20)
CO2: 25 mmol/L (ref 22–32)
CO2: 27 mmol/L (ref 22–32)
Calcium: 9.7 mg/dL (ref 8.9–10.3)
Calcium: 9.1 mg/dL (ref 8.9–10.3)
Chloride: 124 mmol/L — ABNORMAL HIGH (ref 101–111)
Chloride: 122 mmol/L — ABNORMAL HIGH (ref 101–111)
Creatinine, Ser: 1.37 mg/dL — ABNORMAL HIGH (ref 0.44–1.00)
Creatinine, Ser: 1.1 mg/dL — ABNORMAL HIGH (ref 0.44–1.00)
GFR calc Af Amer: 43 mL/min — ABNORMAL LOW (ref >60)
GFR calc Af Amer: 56 mL/min — ABNORMAL LOW (ref >60)
GFR calc non Af Amer: 48 mL/min — ABNORMAL LOW (ref >60)
GFR, EST NON AFRICAN AMERICAN: 37 mL/min — AB (ref >60)
GLUCOSE: 97 mg/dL (ref 65–99)
Glucose, Bld: 130 mg/dL — ABNORMAL HIGH (ref 65–99)
POTASSIUM: 3.4 mmol/L — AB (ref 3.5–5.1)
Potassium: 3.5 mmol/L (ref 3.5–5.1)
Sodium: 157 mmol/L — ABNORMAL HIGH (ref 135–145)
Sodium: 154 mmol/L — ABNORMAL HIGH (ref 135–145)

## 2014-10-28 LAB — CBC
HCT: 33 % — ABNORMAL LOW (ref 36.0–46.0)
Hemoglobin: 9.9 g/dL — ABNORMAL LOW (ref 12.0–15.0)
MCH: 30.1 pg (ref 26.0–34.0)
MCHC: 30 g/dL (ref 30.0–36.0)
MCV: 100.3 fL — ABNORMAL HIGH (ref 78.0–100.0)
Platelets: 162 10*3/uL (ref 150–400)
RBC: 3.29 MIL/uL — ABNORMAL LOW (ref 3.87–5.11)
RDW: 17 % — ABNORMAL HIGH (ref 11.5–15.5)
WBC: 8 10*3/uL (ref 4.0–10.5)

## 2014-10-28 LAB — PROTIME-INR
INR: 1.33 (ref 0.00–1.49)
Prothrombin Time: 16.6 seconds — ABNORMAL HIGH (ref 11.6–15.2)

## 2014-10-28 LAB — FOLATE RBC
Folate, RBC: >1574 ng/mL (ref >498)
Hematocrit: 39.4 % (ref 34.0–46.6)

## 2014-10-28 LAB — HEMOGLOBIN A1C
HEMOGLOBIN A1C: 5.5 % (ref 4.8–5.6)
Mean Plasma Glucose: 111 mg/dL

## 2014-10-28 LAB — T3, FREE: T3 FREE: 1.7 pg/mL — AB (ref 2.0–4.4)

## 2014-10-28 LAB — GLUCOSE, CAPILLARY
Glucose-Capillary: 113 mg/dL — ABNORMAL HIGH (ref 65–99)
Glucose-Capillary: 128 mg/dL — ABNORMAL HIGH (ref 65–99)

## 2014-10-28 LAB — URINE MICROSCOPIC-ADD ON

## 2014-10-28 LAB — LACTIC ACID, PLASMA: Lactic Acid, Venous: 0.9 mmol/L (ref 0.5–2.0)

## 2014-10-28 MED ORDER — MIDAZOLAM HCL 2 MG/2ML IJ SOLN
INTRAMUSCULAR | Status: AC
  Filled 2014-10-28: qty 4

## 2014-10-28 MED ORDER — WARFARIN SODIUM 2 MG PO TABS
2.0000 mg | ORAL_TABLET | Freq: Once | ORAL | Status: DC
  Filled 2014-10-28: qty 1

## 2014-10-28 MED ORDER — GLUCAGON HCL RDNA (DIAGNOSTIC) 1 MG IJ SOLR: 1.0000 mg | Freq: Once | INTRAMUSCULAR | Status: AC | PRN

## 2014-10-28 MED ORDER — FENTANYL CITRATE (PF) 100 MCG/2ML IJ SOLN
INTRAMUSCULAR | Status: AC
  Filled 2014-10-28: qty 4

## 2014-10-28 MED ORDER — MIDAZOLAM HCL 2 MG/2ML IJ SOLN
INTRAMUSCULAR | Status: AC | PRN
  Administered 2014-10-28: 0.5 mg via INTRAVENOUS

## 2014-10-28 MED ORDER — LIDOCAINE HCL 1 % IJ SOLN
INTRAMUSCULAR | Status: AC
  Filled 2014-10-28: qty 20

## 2014-10-28 MED ORDER — SODIUM CHLORIDE 0.9 % IV BOLUS (SEPSIS)
1000.0000 mL | Freq: Once | INTRAVENOUS | Status: AC
  Administered 2014-10-28: 1000 mL via INTRAVENOUS

## 2014-10-28 MED ORDER — LEVOTHYROXINE SODIUM 25 MCG PO TABS
150.0000 ug | ORAL_TABLET | Freq: Every day | ORAL | Status: DC
  Administered 2014-10-29 – 2014-11-03 (×6): 150 ug via ORAL
  Filled 2014-10-28: qty 2
  Filled 2014-10-28: qty 1
  Filled 2014-10-28: qty 2
  Filled 2014-10-28 (×3): qty 1
  Filled 2014-10-28 (×2): qty 2
  Filled 2014-10-28 (×2): qty 1
  Filled 2014-10-28 (×3): qty 2

## 2014-10-28 MED ORDER — DEXTROSE 5 % IV BOLUS
1000.0000 mL | Freq: Once | INTRAVENOUS | Status: AC
  Administered 2014-10-28: 1000 mL via INTRAVENOUS

## 2014-10-28 MED ORDER — NALOXONE HCL 0.4 MG/ML IJ SOLN
INTRAMUSCULAR | Status: AC
  Filled 2014-10-28: qty 1

## 2014-10-28 MED ORDER — KCL IN DEXTROSE-NACL 40-5-0.45 MEQ/L-%-% IV SOLN
INTRAVENOUS | Status: DC
  Administered 2014-10-28 – 2014-10-29 (×3): via INTRAVENOUS
  Filled 2014-10-28 (×5): qty 1000

## 2014-10-28 MED ORDER — POTASSIUM CHLORIDE 2 MEQ/ML IV SOLN: INTRAVENOUS | Status: DC

## 2014-10-28 MED ORDER — IOHEXOL 300 MG/ML  SOLN
10.0000 mL | Freq: Once | INTRAMUSCULAR | Status: AC | PRN
  Administered 2014-10-28: 10 mL

## 2014-10-28 MED ORDER — ASPIRIN 300 MG RE SUPP
300.0000 mg | Freq: Once | RECTAL | Status: AC
  Administered 2014-10-29: 300 mg via RECTAL
  Filled 2014-10-28: qty 1

## 2014-10-28 MED ORDER — NALOXONE HCL 0.4 MG/ML IJ SOLN
0.4000 mg | Freq: Once | INTRAMUSCULAR | Status: AC
  Administered 2014-10-28: 0.4 mg via INTRAVENOUS

## 2014-10-28 MED ORDER — WARFARIN SODIUM 4 MG PO TABS
4.0000 mg | ORAL_TABLET | Freq: Once | ORAL | Status: AC
  Administered 2014-10-28: 4 mg
  Filled 2014-10-28: qty 1

## 2014-10-28 MED ORDER — GLUCAGON HCL RDNA (DIAGNOSTIC) 1 MG IJ SOLR
INTRAMUSCULAR | Status: AC
  Filled 2014-10-28: qty 1

## 2014-10-28 NOTE — Procedures (Signed)
Successful placement of gastrostomy tube without difficulty.  Minimal blood loss.

## 2014-10-28 NOTE — Progress Notes (Addendum)
Shift event: This NP paged at 1952 hours because rapid response was in pt's room for unresponsiveness. NP to bedside.  Upon arrival, RRRN and RN [facility] at bedside along with day and night shift RNs. Per RNs, there was no witnessed seizure activity. S: per RN, pt had a PEG tube placed by IR today and after that has been a little sleepy, but arousable and was previously talking. Pt was started on new antipsychotic med today for dementia and received this when she returned from PEG around 1730 hrs. After which, RN noticed she became more and more lethargic and called RR.  O: Frail elderly WF in no distress. Looks much older than stated age. VS reviewed. Pt satting 92% on 2L per [place]. BP 90s. HR 70s and regular. Resp 16, even and unlabored but somewhat shallow. No use of accessory muscles. 1000cc NS bolus in progress. Last BP 103. Lungs clear but diminished. No wheezing. RRR. PERRL at 2mm. Lying with mouth open but no noted facial asymmetry. Does not follow commands. No spontaneous movement of extremities. Withdraws to pain with right leg but not left. No movement or resistance with movement of any extremity. Opens eyes minimally to sternal rub. Moaned once. Otherwise, non verbal. No signs of tongue biting or incontinence.  A/P: 1. Altered mental status of unknown etiology-suspect sedation from IR sedation meds and/or new antipsychotic. Narcan given without response, so doubt this is narcotic related. Doubt seizure activity/post ictal after exam. Pt is on Coumadin, so will get a stat CT head to r/o bleed. Get stat labs. Will place a Foley given acute decline in status and for accurate output.  2. PNA-stat CXR and LA to r/o sepsis.  3. Dementia-d/c Saphris. Hold all other sedative meds tonight.  4. Hypotension-borderline. Giving 1000cc NS bolus. BP has come up some prior to transport to CT.  Awaiting labs and tests. Will call family.  Update to follow.  Jimmye Norman, NP [facility] Update: CT head neg  for acute issues. CXR neg for acute issues or worsening PNA. Attempted to call pt's son, no answer. Will try again. Still awaiting labs and EKG. Also, ordered UA.  KJKG, NP Update: spoke to son, Asherah Lavoy. Infomed him of above and present treatment plan and he agrees.  Update: LA normal. Labs are stable. Na is trending down. EKG negative for acute. BP still soft-gave D5W 1L bolus. BP up to the 100s range. Saw pt again. At this time, she is still not retracting to pain with left side. When sternal rubbed, she moves her right hand, but not the left. She arouses easier now but still not following commands. She can mouth her name now, otherwise non verbal. This NP then called Dr. Allena Katz from [facility] just to get a 2nd opinion. Dr. Allena Katz to bedside. At that time, tested extremities again and with pain and she is now withdrawing to pain on the left LE. A small response of movement on LUE now but not purposeful, only to pain. Dr. Allena Katz agreed with calling neuro as we are debating doing a stat MRI brain. This NP called neuro on call, Dr. Thad Ranger, and discussed pt's history, hospital course thus far, and findings of tonight. Dr. Thad Ranger agreed that it may be the new medication and those meds can mimic stroke symptoms when pt is heavily sedated. And sometimes, as a pt wakes up further, the pt will move their dominant side before the non dominant side. Informed that CT head was neg for bleed. Discussed  MRI and after reviewing pt's hx, chart, baseline mental status, etc, Dr. Thad Rangereynolds explained that pt would not be a candidate for TPA given we do not know her "last normal" time. She is not a candidate for invasive procedures given her dementia and other chronic health issues. Therefore, the plan now would be ASA x 1 tonight, with or without results of MRI brain. Elected to keep pt here and give ASA suppository and wait on MRI. If she wakes up further tonight, can defer MRI tomorrow. If not, we have treated her for a  stroke tonight and can do MRI in am and start complete stroke workup if needed. Dr. Allena KatzPatel aware of this plan.  This NP then called pt's son again and discussed newer findings and plan of care. Informed Molli HazardMatthew about MRI ? stroke and about the plan of care with the ASA and waiting on MRI til tomorrow if needed at all. He agreed to this plan. Discussed code status with son and he and his sister are not ready to let go at this point. Since, full code, will move pt to SDU for closer monitoring of VS and neuro status.  Pt is on Coumadin and this NP couldn't find a reason for this. 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 Last dose at SNF. INR not therapeutic on admission and Coumadin was held in anticipation of PEG tube placement. Coumadin restarted per pharmacy. Given it has been at least 4 mos of treatment will defer to attending as to whether she needs to continue Coumadin at all.  Will continue to follow closely.  KJKG, NP Update: Ordered ABG and ammonia. ABG showed slightly high PCO2 at 51 and PO2 of 68 with normal O2 saturation. pH slightly low. Pt has never dropped her O2 sats. This NP went back to bedside. Pt skin is cool but rectal temp is 98. Per RN, pt was able to grip slightly to command and move all 4 extremities a few minutes ago. Also, while NP here, pt told me her name and that she is in the hospital. Speech is not fluent, though, and she is still lethargic. Going to try Bipap for a couple of hours but don't think PCO2 is the main contributing factor to her lethargy. Will get ABG 2 hours and monitor pt for increased alertness.  KJKG, NP Update: Ammonia normal. Bipap did not change the numbers on the r/p ABG except for raising the O2. This NP saw pt this am and she was being bothered by the bipap mask, so I removed it. 2L [place] replaced and O2 sat normal. She is alert, talking, laughing and even singing songs this am. She is oriented to her name, place, knows her son's name  and her speech is understandable this am (baseline is slurred somewhat due to no dentures). She follows commands and is now able to lift her arms in the air and legs off the bed. Has had good UO tonight. BP is holding stable after 2 boluses tonight. VS look good this am. At this point, don't feel MRI is necessary any longer. Also, may be able to stop coumadin altogether since DVT was in Jan?  Called son again to update him on the transformation and plan of likely not getting the MRI. He was grateful for the update.  Believe this event to be totally related to the Saphris. It has been discontinued.  Jimmye NormanKaren Kirby-Graham, NP [facility]

## 2014-10-28 NOTE — Progress Notes (Signed)
SLP Cancellation Note  Patient Details Name: [name] Arhoebe Pott MRN: [id number] DOB: 03/30/1941   Cancelled treatment:       Reason Eval/Treat Not Completed: Other (comment) (pt currently NPO - per RN pt to receive feeding tube today-per RN pt vomited after intake yesterday and concern for nutrition adequacy is present)   Donavan Burnetamara Sharma Lawrance, MS [facility] S[facility] [phone]

## 2014-10-28 NOTE — Progress Notes (Signed)
Night RN stated that pt was very lethargic after receiving lithium.  Pt continues to be very lethargic.  Attempted to feed breakfast and give medications but pt unable to swallow.  Suctioned mouth and did oral care.  Will continue to monitor closely.

## 2014-10-28 NOTE — Progress Notes (Signed)
Telemetry called stating pt had a burst of SVT, HR in the 180's.  Pt asymptomatic, HR currently 88.  MD made aware.  Will continue to monitor closely.

## 2014-10-28 NOTE — Clinical Documentation Improvement (Signed)
Please clarify the underlying cause of patient's altered mental status   Documents ED: "presents from nursing home for AMS x 1 week  For the last 5-6 days she has been laying and staring without making any noise, lethargic and non responsive to painful stimuli" started on Lithium 3 weeks ago H&P: "presents with decreased responsiveness and abnormal labs." has been more somnolent and not as active "   --Encephalopathy  Anoxic/hypoxic Drug-induced/toxic (specify drug) Hepatic Hypertensive Hypoglycemic Metabolic/septic  [place] Other (specify) . Document any associated diagnoses/conditions: Dementia, Hypernatremia, Dehydration  Thank You,  Elpidio AnisGarnet Micalah Cabezas, RN, BSN, CDI [phone] [facility] Dept.

## 2014-10-28 NOTE — H&P (Signed)
Reason for Consult: Malnutrition   Chief Complaint: Chief Complaint  Patient presents with  . Altered Mental Status   Referring Physician(s): TRH  History of Present Illness: [name] [name] is a 74 y.o. female who was admitted from ED after presenting from her SNF with 1 week history of worsening AMS. History is obtained per chart review and by her son over the phone as the patient is currently non-verbal and has altered MS changes. She was found to be hypernatremic which per her son has been a frequent event in the last couple months with failed attempts of IV hydration at SNF and recurrence of hypernatremia. She has not been eating or taking in fluids with significant weight loss, concern for dementia, but this has not been officially diagnosed yet per son. She was also found to have LLL pneumonia by imaging and has been on antibiotics since 5/24. IR received request for G-tube placement.   Past Medical History  Diagnosis Date  . Hypertension   . Bipolar 1 disorder   . Hypothyroidism   . Memory difficulties   . HLD (hyperlipidemia)   . Diverticulitis large intestine   . Pneumonia     Past Surgical History  Procedure Laterality Date  . No past surgeries      Allergies: Antihistamine decongestant; Triprolidine-pseudoephedrine; and Codeine  Medications: Prior to Admission medications   Medication Sig Start Date End Date Taking? Authorizing Provider  amLODipine (NORVASC) 5 MG tablet Take 5 mg by mouth daily with breakfast.    Yes Historical Provider, MD  Asenapine Maleate 10 MG SUBL Place 10 mg under the tongue 2 (two) times daily. 10/19/14 11/18/14 Yes Historical Provider, MD  Dietary Management Product (ENLYTE PO) Take 1 tablet by mouth daily.   Yes Historical Provider, MD  ferrous sulfate 325 (65 FE) MG tablet Take 325 mg by mouth daily with breakfast.   Yes Historical Provider, MD  L-Methylfolate-B12-B6-B2 (CEREFOLIN PO) Take 1 tablet by mouth daily.   Yes Historical  Provider, MD  levothyroxine (SYNTHROID, LEVOTHROID) 175 MCG tablet Take 175 mcg by mouth daily before breakfast.   Yes Historical Provider, MD  lithium carbonate 300 MG capsule Take 300 mg by mouth 2 (two) times daily. 10/19/14  Yes Historical Provider, MD  memantine (NAMENDA) 5 MG tablet Take 5 mg by mouth 2 (two) times daily.   Yes Historical Provider, MD  warfarin (COUMADIN) 1 MG tablet Take 3.5 mg by mouth daily. 1700   Yes Historical Provider, MD  acetaminophen (TYLENOL) 160 MG/5ML solution Take 20.3 mLs (650 mg total) by mouth every 6 (six) hours as needed for mild pain, headache or fever. Patient not taking: Reported on 07/26/2014 06/25/14   Alison Murray, MD  albuterol (PROVENTIL) (2.5 MG/3ML) 0.083% nebulizer solution Take 3 mLs (2.5 mg total) by nebulization every 4 (four) hours as needed for wheezing. Patient not taking: Reported on 09/22/2014 06/25/14   Alison Murray, MD  antiseptic oral rinse (BIOTENE) LIQD 15 mLs by Mouth Rinse route as needed for dry mouth.    Historical Provider, MD  aspirin 81 MG chewable tablet Chew 1 tablet (81 mg total) by mouth daily. Patient not taking: Reported on 09/22/2014 06/25/14   Alison Murray, MD  b complex vitamins capsule Take 1 capsule by mouth daily with breakfast.    Historical Provider, MD  carbamazepine (TEGRETOL XR) 200 MG 12 hr tablet Take 200 mg by mouth at bedtime.    Historical Provider, MD  cholecalciferol (VITAMIN D) 1000 UNITS  tablet Take 1,000 Units by mouth daily with breakfast.    Historical Provider, MD  feeding supplement, RESOURCE BREEZE, (RESOURCE BREEZE) LIQD Take 1 Container by mouth 3 (three) times daily between meals. Patient not taking: Reported on 07/26/2014 06/25/14   Alison Murray, MD  furosemide (LASIX) 20 MG tablet Take 1 tablet (20 mg total) by mouth daily. Patient not taking: Reported on 09/22/2014 08/06/14   Rhetta Mura, MD  lactobacillus acidophilus (BACID) TABS tablet Take 1 tablet by mouth 2 (two) times daily.     Historical Provider, MD  lamoTRIgine (LAMICTAL) 25 MG tablet Take 2 tablets (50 mg total) by mouth 2 (two) times daily. Patient not taking: Reported on 09/22/2014 08/06/14   Rhetta Mura, MD  LORazepam (ATIVAN) 0.5 MG tablet Take 1 tablet (0.5 mg total) by mouth every 6 (six) hours as needed for anxiety (and agitation). Patient not taking: Reported on 10/26/2014 08/06/14   Rhetta Mura, MD  Maltodextrin-Xanthan Gum (RESOURCE THICKENUP CLEAR) POWD Use as indicated. Patient not taking: Reported on 07/26/2014 05/03/14   Kathlen Mody, MD  pantoprazole sodium (PROTONIX) 40 mg/20 mL PACK Take 20 mLs (40 mg total) by mouth daily. Patient not taking: Reported on 07/26/2014 06/25/14   Alison Murray, MD  perphenazine (TRILAFON) 2 MG tablet Take 2-6 mg by mouth 3 (three) times daily. Take  at 0800 and 1700. Take  at 2100    Historical Provider, MD  senna (SENOKOT) 8.6 MG TABS tablet Take 1 tablet (8.6 mg total) by mouth 2 (two) times daily. Patient not taking: Reported on 09/22/2014 05/03/14   Kathlen Mody, MD  simvastatin (ZOCOR) 20 MG tablet Take 20 mg by mouth at bedtime.     Historical Provider, MD  traMADol (ULTRAM) 50 MG tablet Take 1 tablet (50 mg total) by mouth every 6 (six) hours as needed (for pain). 08/06/14   Rhetta Mura, MD     Family History  Problem Relation Age of Onset  . Heart disease Father     History   Social History  . Marital Status: Single    Spouse Name: N/A  . Number of Children: N/A  . Years of Education: N/A   Social History Main Topics  . Smoking status: Never Smoker   . Smokeless tobacco: Never Used  . Alcohol Use: No  . Drug Use: No  . Sexual Activity: No   Other Topics Concern  . None   Social History Narrative   Review of Systems: A 12 point ROS discussed and pertinent positives are indicated in the HPI above.  All other systems are negative.  Review of Systems  Vital Signs: BP 106/65 mmHg  Pulse 99  Temp(Src) 98.5 F (36.9 C)  (Oral)  Resp 19  Ht  (1.676 m)  Wt 128 lb 8.5 oz (58.3 kg)  BMI 20.75 kg/m2  SpO2 96%  Physical Exam General: Alert, NAD, non-verbal  Heart: RRR without M/G/R Lungs: CTA, poor inspiratory effort Abd: Soft, ND, (+) BS  Mallampati Score:  MD Evaluation Airway: WNL Heart: WNL Abdomen: WNL Chest/ Lungs: WNL ASA  Classification: 3 Mallampati/Airway Score: Two  Imaging: Ct Head Wo Contrast  10/26/2014   CLINICAL DATA:  Altered mental status with lethargy and hypernatremia  EXAM: CT HEAD WITHOUT CONTRAST  TECHNIQUE: Contiguous axial images were obtained from the base of the skull through the vertex without intravenous contrast.  COMPARISON:  September 22, 2014  FINDINGS: There is age related volume loss. There is no intracranial mass, hemorrhage, extra-axial  fluid collection, or midline shift. There is slight small vessel disease adjacent to the frontal horns of the lateral ventricles. Elsewhere gray-white compartments appear normal. No acute infarct is apparent. The bony calvarium appears intact. The mastoid air cells are clear. There is debris in the left external auditory canal.  IMPRESSION: Minimal periventricular small vessel disease. No intracranial mass, hemorrhage, or acute appearing infarct. There is probable cerumen in the left external auditory canal.   Electronically Signed   By: Bretta BangWilliam  Woodruff III M.D.   On: 10/26/2014 17:27   Dg Chest Port 1 View  10/26/2014   CLINICAL DATA:  Altered mental status, height burden a tree me of  EXAM: PORTABLE CHEST - 1 VIEW  COMPARISON:  Portable chest x-ray of August 23, 2014  FINDINGS: The lungs are adequately inflated. There are mildly increased lung markings in the retrocardiac region. The left hemidiaphragm is partially obscured. The cardiac silhouette and pulmonary vascularity are normal. The bony thorax exhibits no acute abnormality.  IMPRESSION: Findings consistent with atelectasis or pneumonia in the left lower lobe. There is no CHF.    Electronically Signed   By: David  SwazilandJordan M.D.   On: 10/26/2014 17:29    Labs:  CBC:  Recent Labs  08/05/14 0551 08/06/14 0515 10/26/14 1704 10/27/14 0245  WBC 5.9 6.0 12.6* 12.8*  HGB 10.8* 10.2* 11.9* 12.2  HCT 35.0* 32.7* 41.4 41.4  PLT 250 288 243 232    COAGS:  Recent Labs  10/27/14 0245 10/28/14 0450  INR 1.02 1.33    BMP:  Recent Labs  10/27/14 0245 10/27/14 0900 10/27/14 1925 10/28/14 0450  NA 162* 162* 159* 157*  K 4.0 3.6 3.4* 3.4*  CL 125* 127* 125* 124*  CO2 19* 22 26 25   GLUCOSE 76 73 156* 130*  BUN 26* 26* 24* 21*  CALCIUM 9.9 9.7 9.9 9.7  CREATININE 1.37* 1.30* 1.39* 1.37*  GFRNONAA 37* 39* 36* 37*  GFRAA 43* 46* 42* 43*    LIVER FUNCTION TESTS:  Recent Labs  08/05/14 0551 08/06/14 0515 10/26/14 1704 10/27/14 0245  BILITOT 0.4 0.2* 1.0 1.0  AST 50* 27 33 31  ALT 49* 33 24 23  ALKPHOS 145* 119* 63 61  PROT 5.7* 5.7* 6.2* 5.2*  ALBUMIN 2.8* 2.9* 3.1* 2.8*   Assessment and Plan: Altered mental status Hypernatremia, slowly improving receiving IV hydration  LLL pneumonia on Vancomycin and Maxipime since 5/24, afebrile, wbc stable 12 Severe malnutrition, request for image guided percutaneous gastrostomy tube placement with light sedation CT imaging reviewed and the patient's anatomy is amendable to percutaneous approach The patient has been NPO, on coumadin-INR 1.33 today, labs and vitals have been reviewed. History of DVT on coumadin, INR 1.33 today Risks and Benefits discussed with the patient's son Otis PeakMatthew Hemme including, but not limited to the need for a barium enema during the procedure, bleeding, infection, peritonitis, or damage to adjacent structures. All questions were answered, patient's son is agreeable to proceed. Consent signed and in chart. Non-sustained (1) run of SVT this morning, asymptomatic per RN   Thank you for this interesting consult.  I greatly enjoyed meeting [name] Arhoebe Quam and look forward to  participating in their care.  SignedBerneta Levins: Romolo Sieling D 10/28/2014, 11:30 AM   I spent a total of 40 Minutes in face to face in clinical consultation, greater than 50% of which was counseling/coordinating care for malnutrition.

## 2014-10-28 NOTE — Progress Notes (Signed)
NUTRITION NOTE  Pt seen for full assessment by RD yesterday (5/25). Per rounds this AM, pt to have PEG placed today. Recommend Jevity 1.2 @ 15 mL/hr 24 hours after PEG placement and increase by 10 mL/hr Q4h to goal rate of Jevity 1.2 @ 55 mL/hr. At goal rate, this regimen will provide 1584 kcal, 73 grams protein (97% minimum protein needs) and 1065 mL free water.  RD to continue to follow per protocol.   Trenton GammonJessica Brynlea Spindler, RD, LDN Inpatient Clinical Dietitian Pager # [phone] After hours/weekend pager # [phone]

## 2014-10-28 NOTE — Progress Notes (Addendum)
Pt unresponsive; even to pain.  VSS.  MD made aware. MD stated that she or her colleauge would come by to see pt.     Will continue to monitor closely.  Saphris and other medications given an hour and a half prior.  Spoke with pharmacy- pharmacy stated that a side effect of saphris is drowsiness.

## 2014-10-28 NOTE — Progress Notes (Signed)
ANTICOAGULATION CONSULT NOTE - Follow Up Consult  Pharmacy Consult for Warfarin Indication: Recent DVT  Allergies  Allergen Reactions  . Antihistamine Decongestant [Triprolidine-Pse] Diarrhea  . Triprolidine-Pseudoephedrine Diarrhea  . Codeine Other (See Comments) and Rash    Gi upset Gi upset     Patient Measurements: Height:  (167.6 cm) Weight: 128 lb 8.5 oz (58.3 kg) IBW/kg (Calculated) : 59.3  Vital Signs: Temp: 97.4 F (36.3 C) (05/26 1322) Temp Source: Oral (05/26 1322) BP: 106/68 mmHg (05/26 1322) Pulse Rate: 80 (05/26 1322)  Labs:  Recent Labs  10/26/14 1704 10/26/14 2115 10/27/14 0245 10/27/14 0855 10/27/14 0900 10/27/14 1925 10/28/14 0450  HGB 11.9*  --  12.2  --   --   --   --   HCT 41.4  --  41.4  39.4  --   --   --   --   PLT 243  --  232  --   --   --   --   LABPROT  --   --  13.6  --   --   --  16.6*  INR  --   --  1.02  --   --   --  1.33  CREATININE 1.40*  --  1.37*  --  1.30* 1.39* 1.37*  TROPONINI 0.04* 0.03 0.04* 0.04*  --   --   --     Estimated Creatinine Clearance: 33.2 mL/min (by C-G formula based on Cr of 1.37).   Medications:  Prescriptions prior to admission  Medication Sig Dispense Refill Last Dose  . amLODipine (NORVASC) 5 MG tablet Take 5 mg by mouth daily with breakfast.    10/26/2014 at Unknown time  . Asenapine Maleate 10 MG SUBL Place 10 mg under the tongue 2 (two) times daily.   10/26/2014 at 0900  . Dietary Management Product (ENLYTE PO) Take 1 tablet by mouth daily.   10/26/2014 at Unknown time  . ferrous sulfate 325 (65 FE) MG tablet Take 325 mg by mouth daily with breakfast.   10/26/2014 at Unknown time  . L-Methylfolate-B12-B6-B2 (CEREFOLIN PO) Take 1 tablet by mouth daily.   10/26/2014 at Unknown time  . levothyroxine (SYNTHROID, LEVOTHROID) 175 MCG tablet Take 175 mcg by mouth daily before breakfast.   10/26/2014 at am  . lithium carbonate 300 MG capsule Take 300 mg by mouth 2 (two) times daily.   10/26/2014 at 0800   . memantine (NAMENDA) 5 MG tablet Take 5 mg by mouth 2 (two) times daily.   10/26/2014 at Unknown time  . warfarin (COUMADIN) 1 MG tablet Take 3.5 mg by mouth daily. 1700   10/25/2014 at Unknown time  . acetaminophen (TYLENOL) 160 MG/5ML solution Take 20.3 mLs (650 mg total) by mouth every 6 (six) hours as needed for mild pain, headache or fever. (Patient not taking: Reported on 07/26/2014) 120 mL 0   . albuterol (PROVENTIL) (2.5 MG/3ML) 0.083% nebulizer solution Take 3 mLs (2.5 mg total) by nebulization every 4 (four) hours as needed for wheezing. (Patient not taking: Reported on 09/22/2014) 75 mL 12 Unknown  . aspirin 81 MG chewable tablet Chew 1 tablet (81 mg total) by mouth daily. (Patient not taking: Reported on 09/22/2014) 30 tablet 0 07/25/2014 at Unknown time  . feeding supplement, RESOURCE BREEZE, (RESOURCE BREEZE) LIQD Take 1 Container by mouth 3 (three) times daily between meals. (Patient not taking: Reported on 07/26/2014) 1 Container 0   . furosemide (LASIX) 20 MG tablet Take 1 tablet (20 mg total)  by mouth daily. (Patient not taking: Reported on 09/22/2014) 30 tablet 0   . lamoTRIgine (LAMICTAL) 25 MG tablet Take 2 tablets (50 mg total) by mouth 2 (two) times daily. (Patient not taking: Reported on 09/22/2014) 120 tablet 0   . LORazepam (ATIVAN) 0.5 MG tablet Take 1 tablet (0.5 mg total) by mouth every 6 (six) hours as needed for anxiety (and agitation). (Patient not taking: Reported on 10/26/2014) 30 tablet 0 unk  . Maltodextrin-Xanthan Gum (RESOURCE THICKENUP CLEAR) POWD Use as indicated. (Patient not taking: Reported on 07/26/2014)   unknown  . pantoprazole sodium (PROTONIX) 40 mg/20 mL PACK Take 20 mLs (40 mg total) by mouth daily. (Patient not taking: Reported on 07/26/2014) 30 each 0   . senna (SENOKOT) 8.6 MG TABS tablet Take 1 tablet (8.6 mg total) by mouth 2 (two) times daily. (Patient not taking: Reported on 09/22/2014) 120 each 0 07/25/2014 at Unknown time  . traMADol (ULTRAM) 50 MG tablet  Take 1 tablet (50 mg total) by mouth every 6 (six) hours as needed (for pain). 30 tablet 0 unk    Assessment: [age] yoF presents from NH with AMS, sepsis 2/2 possible HCAP.  To continue warfarin per pharmacy while admitted.   Baseline INR 1.02  Prior anticoagulation: warfarin 3.5 mg PO daily  Significant events: 5/26: PEG tube placed  Today, 10/28/2014:  CBC: none today; wnl from 5/25  INR subtherapeutic but rising appropriately after 1 dose warfarin   Major drug interactions: PTA aspirin  No bleeding issues per nursing  Eating 10% of meals.   Goal of Therapy: INR 2-3  Plan:  Warfarin 4 mg per tube tonight at 18:00  Daily INR  CBC at least q72 hr while on warfarin  Monitor for signs of bleeding or thrombosis   Bernadene Personrew Jaasiel [place], PharmD Pager: [phone] 10/28/2014, 1:44 PM

## 2014-10-28 NOTE — Clinical Documentation Improvement (Signed)
Please specify type of pneumonia in the progress notes . Document causative organism (if known)  . Document mechanism: --Aspiration - --Other (specify)  . Document any associated illness: --Respiratory failure --Sepsis --Underlying lung disease --Other (specify)   Supporting Information: Transfer from SNF, [place], appears lethargic.  CXR in ED showed findings consistent with atelectasis or pneumonia in the left lower lobe. NSG note:pt swallowed a few bites of applesauce mixed with crushed meds and a bite of mashed potatoes she vomited it all back up. Suctioned pt mouth and kept sitting in a high position. pt was on puree diet with thickened liquids. Meds crushed in applesauce.       Clinical/Bedside Swallow Evaluation: Diet Recommendation Medication Administration: Crushed with puree Compensations: Slow rate;Small sips/bites        Plan:  1. Vancomycin 1g IV q24h. 2. Adjust Cefepime from 1g q8h to 1g q12 3. IVF Thank Candiss NorseYou,  Kahli Mayon, RN, BSN, CDI [phone] [facility].

## 2014-10-28 NOTE — Progress Notes (Signed)
[facility] PROGRESS NOTE  [name] [name] ZOX:[id number] DOB: 1941-01-29 DOA: 10/26/2014 PCP: Astrid Divine, MD  Assessment/Plan: 1. Hypernatremia: Possibly from decreased/ none oral intake.  Started on FLUIDS and sodium has improved to 157.   2. LLL pneumonia: Started on antibiotics.   Dehydration: Started on IV fluids. Improving.   Hypertension: Controlled.   Dementia: no agitation.   Acute renal failure; possibly from dehydration and decreased po intake. It appears her baseline creatinine is around 1.37.  Failure to thrive: G TUBE placed and nutrition will be consulted for tube feeds.    Leukocytosis: probably from the pneumonia.   UTI; Cultures grew 1,00,000 gram neg rods.  Should be covered with cefepime.    Low TSh and elevated free t4: - on 175 mcg of synthroid at the facility , decreased the dose of 150 mcg .      Code Status: full code.  Family Communication: none at bedside, discussed with son over the phone.  Disposition Plan: pending   Consultants:  none  Procedures:  none  Antibiotics:  Dysphagia 1 diet.   HPI/Subjective: Refusing to take anything po.   Objective: Filed Vitals:   10/28/14 1512  BP: 106/65  Pulse: 97  Temp: 98.2 F (36.8 C)  Resp: 18    Intake/Output Summary (Last 24 hours) at 10/28/14 1813 Last data filed at 10/28/14 1517  Gross per 24 hour  Intake 1646.25 ml  Output      0 ml  Net 1646.25 ml   Filed Weights   10/26/14 2028 10/27/14 0608  Weight: 58.2 kg (128 lb 4.9 oz) 58.3 kg (128 lb 8.5 oz)    Exam:   General:  Alert but confused  Cardiovascular: s1s2  Respiratory: diminished at bases.   Abdomen: soft non tender non distended bowel sounds heard  Musculoskeletal: no pedal edema.   Data Reviewed: Basic Metabolic Panel:  Recent Labs Lab 10/26/14 1704 10/27/14 0245 10/27/14 0900 10/27/14 1925 10/28/14 0450  NA 164* 162* 162* 159* 157*  K 4.0 4.0 3.6 3.4* 3.4*  CL 126*  125* 127* 125* 124*  CO2 22 19* GLUCOSE 65 76 73 156* 130*  BUN 26* 26* 26* 24* 21*  CREATININE 1.40* 1.37* 1.30* 1.39* 1.37*  CALCIUM 9.9 9.9 9.7 9.9 9.7   Liver Function Tests:  Recent Labs Lab 10/26/14 1704 10/27/14 0245  AST 33 31  ALT 24 23  ALKPHOS 63 61  BILITOT 1.0 1.0  PROT 6.2* 5.2*  ALBUMIN 3.1* 2.8*   No results for input(s): LIPASE, AMYLASE in the last 168 hours. No results for input(s): AMMONIA in the last 168 hours. CBC:  Recent Labs Lab 10/26/14 1704 10/27/14 0245  WBC 12.6* 12.8*  NEUTROABS 10.5*  --   HGB 11.9* 12.2  HCT 41.4 41.4  39.4  MCV 100.2* 100.5*  PLT 243 232   Cardiac Enzymes:  Recent Labs Lab 10/26/14 1704 10/26/14 2115 10/27/14 0245 10/27/14 0855  TROPONINI 0.04* 0.03 0.04* 0.04*   BNP (last 3 results) No results for input(s): BNP in the last 8760 hours.  ProBNP (last 3 results) No results for input(s): PROBNP in the last 8760 hours.  CBG:  Recent Labs Lab 10/27/14 0741 10/28/14 0234 10/28/14 0739  GLUCAP 74 113* 128*    Recent Results (from the past 240 hour(s))  Culture, blood (routine x 2)     Status: None (Preliminary result)   Collection Time: 10/26/14  5:04 PM  Result Value Ref Range Status  Specimen Description BLOOD BLOOD LEFT FOREARM  Final   Special Requests BOTTLES DRAWN AEROBIC AND ANAEROBIC 5 CC EA  Final   Culture   Final           BLOOD CULTURE RECEIVED NO GROWTH TO DATE CULTURE WILL BE HELD FOR 5 DAYS BEFORE ISSUING A FINAL NEGATIVE REPORT Performed at [facility]    Report Status PENDING  Incomplete  Culture, Urine     Status: None (Preliminary result)   Collection Time: 10/26/14  6:27 PM  Result Value Ref Range Status   Specimen Description URINE, CATHETERIZED  Final   Special Requests NONE  Final   Colony Count   Final    >=100,000 COLONIES/ML Performed at [facility]    Culture   Final    GRAM NEGATIVE RODS Performed at [facility]    Report  Status PENDING  Incomplete  MRSA PCR Screening     Status: Abnormal   Collection Time: 10/26/14  8:51 PM  Result Value Ref Range Status   MRSA by PCR POSITIVE (A) NEGATIVE Final    Comment:        The GeneXpert MRSA Assay (FDA approved for NASAL specimens only), is one component of a comprehensive MRSA colonization surveillance program. It is not intended to diagnose MRSA infection nor to guide or monitor treatment for MRSA infections. RESULT CALLED TO, READ BACK BY AND VERIFIED WITH: J.RIMANDO,RN AT 2222 ON 10/26/14 BY W.SHEA   Culture, blood (routine x 2)     Status: None (Preliminary result)   Collection Time: 10/26/14  9:52 PM  Result Value Ref Range Status   Specimen Description BLOOD RAC  Final   Special Requests BOTTLES DRAWN AEROBIC ONLY 3CC  Final   Culture   Final           BLOOD CULTURE RECEIVED NO GROWTH TO DATE CULTURE WILL BE HELD FOR 5 DAYS BEFORE ISSUING A FINAL NEGATIVE REPORT Performed at [facility]    Report Status PENDING  Incomplete     Studies: Ir Gastrostomy Tube Mod Sed  10/28/2014   CLINICAL DATA:  Altered mental status and hypernatremia.  EXAM: PERCUTANEOUS GASTROSTOMY TUBE WITH FLUOROSCOPIC GUIDANCE  Physician: Rachelle Hora. Henn, MD  FLUOROSCOPY TIME:  4 minutes and 18 seconds, 36 mGy  MEDICATIONS AND MEDICAL HISTORY: 0.5 mg Versed. 0.5 mg glucagon. The patient is already on antibiotics.  ANESTHESIA/SEDATION: Patient was continuously monitored by a radiology nurse during the procedure.  PROCEDURE: Informed consent was obtained for a percutaneous gastrostomy tube by the patient's son. The patient was placed on the interventional table. Fluoroscopy demonstrated gas and stool in the transverse colon. An orogastric tube was placed with fluoroscopic guidance. The anterior abdomen was prepped and draped in sterile fashion. Maximal barrier sterile technique was utilized including caps, mask, sterile gowns, sterile gloves, sterile drape, hand hygiene and skin  antiseptic. Stomach was inflated with air through the orogastric tube. The skin and subcutaneous tissues were anesthetized with 1% lidocaine. A 17 gauge needle was directed into the distended stomach with fluoroscopic guidance. A wire was advanced into the stomach and a T-tact was deployed. A 9-French vascular sheath was placed and the orogastric tube was snared using a [place] snare device. The orogastric tube and snare were pulled out of the patient's mouth. The snare device was connected to a 20-French gastrostomy tube. The snare device and gastrostomy tube were pulled through the patient's mouth and out the anterior abdominal wall. The gastrostomy  tube was cut to an appropriate length. Contrast injection through gastrostomy tube confirmed placement within the stomach. Fluoroscopic images were obtained for documentation. The gastrostomy tube was flushed with normal saline.  FINDINGS: Gastrostomy tube within the stomach.  Estimated blood loss: Minimal  COMPLICATIONS: None  IMPRESSION: Successful fluoroscopic guided percutaneous gastrostomy tube placement.   Electronically Signed   By: Richarda OverlieAdam  Henn M.D.   On: 10/28/2014 14:17    Scheduled Meds: . acidophilus  1 capsule Oral Daily  . amLODipine  5 mg Oral Q breakfast  . asenapine  10 mg Sublingual BID  . aspirin  81 mg Oral Daily  . B-complex with vitamin C  1 tablet Oral Q breakfast  . ceFEPime (MAXIPIME) IV  1 g Intravenous Q12H  . Chlorhexidine Gluconate Cloth  6 each Topical Q0600  . cholecalciferol  1,000 Units Oral Q breakfast  . feeding supplement (ENSURE ENLIVE)  237 mL Oral TID BM  . ferrous sulfate  325 mg Oral Q breakfast  . folic acid  1 mg Oral Daily  . glucagon (human recombinant)      . levothyroxine  175 mcg Oral QAC breakfast  . lidocaine      . lithium carbonate  300 mg Oral BID WC  . memantine  5 mg Oral BID  . midazolam      . multivitamin with minerals  1 tablet Oral Daily  . mupirocin ointment  1 application Nasal BID  .  pantoprazole sodium  40 mg Oral Daily  . sodium chloride  3 mL Intravenous Q12H  . thiamine  100 mg Oral Daily  . vancomycin  1,000 mg Intravenous Q24H  . warfarin  4 mg Per Tube ONCE-1800  . Warfarin - Pharmacist Dosing Inpatient   Does not apply q1800   Continuous Infusions: . dextrose 5 % and 0.45 % NaCl with KCl 40 mEq/L 75 mL/hr at 10/28/14 1056    Principal Problem:   Hypernatremia Active Problems:   Bipolar 1 disorder   Acute kidney injury   Dehydration   Essential hypertension   Dyslipidemia   Dementia   LLL pneumonia   Malnutrition of moderate degree   Protein-calorie malnutrition, severe    Time spent: 25 minutes.     Upmc MemorialKULA,Sakib Noguez  [facility] Pager [phone]. If 7PM-7AM, please contact night-coverage at [email or web address], password [facility] 10/28/2014, 6:13 PM  LOS: 2 days

## 2014-10-28 NOTE — Progress Notes (Signed)
Verified with phamacy that listed medications are ok to give thru feeding tube.

## 2014-10-29 LAB — BLOOD GAS, ARTERIAL
ACID-BASE DEFICIT: 0.7 mmol/L (ref 0.0–2.0)
ACID-BASE DEFICIT: 1 mmol/L (ref 0.0–2.0)
BICARBONATE: 25 meq/L — AB (ref 20.0–24.0)
Bicarbonate: 25.1 mEq/L — ABNORMAL HIGH (ref 20.0–24.0)
DELIVERY SYSTEMS: POSITIVE
Drawn by: 308601
Drawn by: 308601
EXPIRATORY PAP: 5
FIO2: 0.4 %
Inspiratory PAP: 20
MODE: POSITIVE
O2 Content: 4 L/min
O2 SAT: 98.2 %
O2 Saturation: 93.1 %
PCO2 ART: 50.9 mmHg — AB (ref 35.0–45.0)
PH ART: 7.321 — AB (ref 7.350–7.450)
PH ART: 7.312 — AB (ref 7.350–7.450)
Patient temperature: 98.6
Patient temperature: 98.6
TCO2: 23.7 mmol/L (ref 0–100)
TCO2: 23.6 mmol/L (ref 0–100)
pCO2 arterial: 50.1 mmHg — ABNORMAL HIGH (ref 35.0–45.0)
pO2, Arterial: 68.5 mmHg — ABNORMAL LOW (ref 80.0–100.0)
pO2, Arterial: 125 mmHg — ABNORMAL HIGH (ref 80.0–100.0)

## 2014-10-29 LAB — BASIC METABOLIC PANEL
Anion gap: 6 (ref 5–15)
BUN: 14 mg/dL (ref 6–20)
CALCIUM: 9 mg/dL (ref 8.9–10.3)
CHLORIDE: 121 mmol/L — AB (ref 101–111)
CO2: 26 mmol/L (ref 22–32)
Creatinine, Ser: 0.96 mg/dL (ref 0.44–1.00)
GFR calc non Af Amer: 57 mL/min — ABNORMAL LOW (ref >60)
Glucose, Bld: 130 mg/dL — ABNORMAL HIGH (ref 65–99)
POTASSIUM: 4 mmol/L (ref 3.5–5.1)
Sodium: 153 mmol/L — ABNORMAL HIGH (ref 135–145)

## 2014-10-29 LAB — GLUCOSE, CAPILLARY
GLUCOSE-CAPILLARY: 119 mg/dL — AB (ref 65–99)
GLUCOSE-CAPILLARY: 96 mg/dL (ref 65–99)
Glucose-Capillary: 89 mg/dL (ref 65–99)
Glucose-Capillary: 110 mg/dL — ABNORMAL HIGH (ref 65–99)

## 2014-10-29 LAB — PROTIME-INR
INR: 2.43 — ABNORMAL HIGH (ref 0.00–1.49)
Prothrombin Time: 26.2 seconds — ABNORMAL HIGH (ref 11.6–15.2)

## 2014-10-29 LAB — URINE CULTURE: Colony Count: >=100000

## 2014-10-29 LAB — AMMONIA: Ammonia: 38 umol/L — ABNORMAL HIGH (ref 9–35)

## 2014-10-29 LAB — STREP PNEUMONIAE URINARY ANTIGEN: Strep Pneumo Urinary Antigen: NEGATIVE

## 2014-10-29 MED ORDER — CHLORHEXIDINE GLUCONATE 0.12 % MT SOLN
15.0000 mL | Freq: Two times a day (BID) | OROMUCOSAL | Status: DC
  Administered 2014-10-29 – 2014-11-03 (×11): 15 mL via OROMUCOSAL
  Filled 2014-10-29 (×10): qty 15

## 2014-10-29 MED ORDER — SODIUM CHLORIDE 0.9 % IV BOLUS (SEPSIS)
1000.0000 mL | Freq: Once | INTRAVENOUS | Status: AC
  Administered 2014-10-29: 1000 mL via INTRAVENOUS

## 2014-10-29 MED ORDER — JEVITY 1.2 CAL PO LIQD
1000.0000 mL | ORAL | Status: DC
  Administered 2014-10-29 – 2014-11-03 (×5): 1000 mL
  Filled 2014-10-29 (×9): qty 1000

## 2014-10-29 MED ORDER — CETYLPYRIDINIUM CHLORIDE 0.05 % MT LIQD
7.0000 mL | Freq: Two times a day (BID) | OROMUCOSAL | Status: DC
  Administered 2014-10-29 – 2014-11-02 (×9): 7 mL via OROMUCOSAL

## 2014-10-29 MED ORDER — SODIUM CHLORIDE 0.45 % IV BOLUS
1000.0000 mL | Freq: Once | INTRAVENOUS | Status: AC
  Administered 2014-10-29: 1000 mL via INTRAVENOUS

## 2014-10-29 MED ORDER — SODIUM CHLORIDE 0.9 % IJ SOLN
10.0000 mL | Freq: Two times a day (BID) | INTRAMUSCULAR | Status: DC
  Administered 2014-10-31 – 2014-11-02 (×2): 10 mL

## 2014-10-29 MED ORDER — SODIUM CHLORIDE 0.9 % IJ SOLN
10.0000 mL | INTRAMUSCULAR | Status: DC | PRN
  Administered 2014-10-30 – 2014-11-03 (×5): 10 mL
  Filled 2014-10-29 (×5): qty 40

## 2014-10-29 NOTE — Progress Notes (Signed)
Speech Language Pathology Treatment: Dysphagia  Patient Details Name: [name] [name] MRN: [id number] DOB: August 29, 1940 Today's Date: 10/29/2014 Time: [id number] SLP Time Calculation (min) (ACUTE ONLY): 41 min  Assessment / Plan / Recommendation Clinical Impression  Pt had PEG placed 5/26; unresponsive event 0300 and RN has held po's.  RN questioned if po's are to still be allowed with PEG, as PEG was placed primarily due to decreased nutrition.  Currently, pt swallows sips of nectar thick liquid without overt s/s of aspiration, but began gagging after bite of applesauce. SLP suctioned and removed applesauce mixed with thick copious secretions from the pharynx.  Recommend NPO except sips/chips after thorough oral care (discussed with RN).  Will proceed with MBS 5/28 to determine if pt may resume po's safely vs. Need to remain NPO with PEG tube feedings only.   HPI Other Pertinent Information: 74 [facility] and hypernatremia.  Found to have left lower lobe pna.  PMH + for dysphagia, bipolar d/o, dementia, HLD, HTN.  Last swallow evaluation completed during February admit with trace aspiration of thin - silent- due to premature spillage into larynx prior to swallow trigger.  Pt reported preferring nectar liquids and pureed foods at that time due to her dysphagia - coughing with liquids and decreased ability to masticate solids due to dentition.  CT head negative.     Pertinent Vitals Pain Assessment: No/denies pain  SLP Plan  MBS    Recommendations Diet recommendations: NPO (except water after oral care.) Liquids provided via: Teaspoon Medication Administration: Via alternative means Supervision: Full supervision/cueing for compensatory strategies Postural Changes and/or Swallow Maneuvers: Seated upright 90 degrees              Follow up Recommendations: Skilled Nursing facility Plan: MBS    GO     [name] RochesterWillis, [name] [name] T 10/29/2014, 5:35 PM

## 2014-10-29 NOTE — Progress Notes (Signed)
Pt placed on BIPAP per Jimmye NormanKaren Kirby-Graham, NP.  Rt to follow up with ABG in couple hours, RT to monitor and assess as needed.

## 2014-10-29 NOTE — Progress Notes (Signed)
ANTICOAGULATION CONSULT NOTE - Follow Up Consult  Pharmacy Consult for Warfarin Indication: hx DVT  Allergies  Allergen Reactions  . Antihistamine Decongestant [Triprolidine-Pse] Diarrhea  . Triprolidine-Pseudoephedrine Diarrhea  . Codeine Other (See Comments) and Rash    Gi upset Gi upset     Patient Measurements: Height: 5\' 6"  (167.6 cm) Weight: 135 lb 12.9 oz (61.6 kg) IBW/kg (Calculated) : 59.3  Vital Signs: Temp: 98.4 F (36.9 C) (05/27 1600) Temp Source: Oral (05/27 1600) BP: 100/53 mmHg (05/27 1802) Pulse Rate: 68 (05/27 1400)  Labs:  Recent Labs  10/26/14 2115 10/27/14 0245 10/27/14 0855  10/28/14 0450 10/28/14 2151 10/29/14 0935 10/29/14 1747  HGB  --  12.2  --   --   --  9.9*  --   --   HCT  --  41.4  39.4  --   --   --  33.0*  --   --   PLT  --  232  --   --   --  162  --   --   LABPROT  --  13.6  --   --  16.6*  --   --  26.2*  INR  --  1.02  --   --  1.33  --   --  2.43*  CREATININE  --  1.37*  --   < > 1.37* 1.10* 0.96  --   TROPONINI 0.03 0.04* 0.04*  --   --   --   --   --   < > = values in this interval not displayed.  Estimated Creatinine Clearance: 48.1 mL/min (by C-G formula based on Cr of 0.96).   Medications:  Prescriptions prior to admission  Medication Sig Dispense Refill Last Dose  . amLODipine (NORVASC) 5 MG tablet Take 5 mg by mouth daily with breakfast.    10/26/2014 at Unknown time  . Asenapine Maleate 10 MG SUBL Place 10 mg under the tongue 2 (two) times daily.   10/26/2014 at 0900  . Dietary Management Product (ENLYTE PO) Take 1 tablet by mouth daily.   10/26/2014 at Unknown time  . ferrous sulfate 325 (65 FE) MG tablet Take 325 mg by mouth daily with breakfast.   10/26/2014 at Unknown time  . L-Methylfolate-B12-B6-B2 (CEREFOLIN PO) Take 1 tablet by mouth daily.   10/26/2014 at Unknown time  . levothyroxine (SYNTHROID, LEVOTHROID) 175 MCG tablet Take 175 mcg by mouth daily before breakfast.   10/26/2014 at am  . lithium carbonate  300 MG capsule Take 300 mg by mouth 2 (two) times daily.   10/26/2014 at 0800  . memantine (NAMENDA) 5 MG tablet Take 5 mg by mouth 2 (two) times daily.   10/26/2014 at Unknown time  . warfarin (COUMADIN) 1 MG tablet Take 3.5 mg by mouth daily. 1700   10/25/2014 at Unknown time  . acetaminophen (TYLENOL) 160 MG/5ML solution Take 20.3 mLs (650 mg total) by mouth every 6 (six) hours as needed for mild pain, headache or fever. (Patient not taking: Reported on 07/26/2014) 120 mL 0   . albuterol (PROVENTIL) (2.5 MG/3ML) 0.083% nebulizer solution Take 3 mLs (2.5 mg total) by nebulization every 4 (four) hours as needed for wheezing. (Patient not taking: Reported on 09/22/2014) 75 mL 12 Unknown  . aspirin 81 MG chewable tablet Chew 1 tablet (81 mg total) by mouth daily. (Patient not taking: Reported on 09/22/2014) 30 tablet 0 07/25/2014 at Unknown time  . feeding supplement, RESOURCE BREEZE, (RESOURCE BREEZE) LIQD Take 1 Container by  mouth 3 (three) times daily between meals. (Patient not taking: Reported on 07/26/2014) 1 Container 0   . furosemide (LASIX) 20 MG tablet Take 1 tablet (20 mg total) by mouth daily. (Patient not taking: Reported on 09/22/2014) 30 tablet 0   . lamoTRIgine (LAMICTAL) 25 MG tablet Take 2 tablets (50 mg total) by mouth 2 (two) times daily. (Patient not taking: Reported on 09/22/2014) 120 tablet 0   . LORazepam (ATIVAN) 0.5 MG tablet Take 1 tablet (0.5 mg total) by mouth every 6 (six) hours as needed for anxiety (and agitation). (Patient not taking: Reported on 10/26/2014) 30 tablet 0 unk  . Maltodextrin-Xanthan Gum (RESOURCE THICKENUP CLEAR) POWD Use as indicated. (Patient not taking: Reported on 07/26/2014)   unknown  . pantoprazole sodium (PROTONIX) 40 mg/20 mL PACK Take 20 mLs (40 mg total) by mouth daily. (Patient not taking: Reported on 07/26/2014) 30 each 0   . senna (SENOKOT) 8.6 MG TABS tablet Take 1 tablet (8.6 mg total) by mouth 2 (two) times daily. (Patient not taking: Reported on  09/22/2014) 120 each 0 07/25/2014 at Unknown time  . traMADol (ULTRAM) 50 MG tablet Take 1 tablet (50 mg total) by mouth every 6 (six) hours as needed (for pain). 30 tablet 0 unk    Assessment: 42 yoF presents from NH with AMS, sepsis 2/2 possible HCAP and on coumadin PTA for hx DVT.  To continue warfarin per pharmacy while admitted.   Baseline INR 1.02  Prior anticoagulation: warfarin 3.5 mg PO daily  Significant events: 5/26: PEG tube placed 5/27: PICC placement  Today, 10/29/2014:  CBC: none today; wnl from 5/26  INR increased sharply from 1.33 to 2.43 today after  dose on 5/25 and  dose on 5/26 (likely secondary to being on abx and poor oral intake)  Major drug interactions: cefepime/vancomycin  No bleeding documented  Goal of Therapy: INR 2-3  Plan:  No coumadin today d/t sharp INR jump.  F/u with INR in AM and redose if/when appropriate.  Daily INR  Monitor for signs of bleeding or thrombosis  Dorna Leitz, PharmD, BCPS 10/29/2014 6:25 PM

## 2014-10-29 NOTE — Progress Notes (Signed)
[facility] PROGRESS NOTE  [name] [name] ZOX:[id number] DOB: May 15, 1941 DOA: 10/26/2014 PCP: Astrid Divine, MD Interim summary: 74 y.o.old lady with h/o dementia, hypertension, comes in for altered mental status and abnormal labs.  She was found to be hypernatremic, has a LLL pneumonia and UTI. She was started on broad spectrum antibiotics . Family requested G tube placement as she has been previously admitted for similar complaints, decreased po intake and dehydration . IR consulted and she underwent G tube placement on 5/26. Few hours after the G tube placement, patient became more lethargic probably from the versad and saphris. Over night patient became more lethargic, hypotensive and hypoxic and was transferred to Step down. She underwent a CT head , which did not reveal any acute abnormality. She was briefly on BIPAP, but was later on transitioned to [place] oxygen. Overnight she received a litre fluid bolus.  Earlier this am she is alert and conversing and requesting breakfast.  NPO discontinued and dysphagia 1 diet ordered. CXR remains unchanged.   Assessment/Plan: 1. Hypernatremia: Possibly from decreased/ none oral intake.  Started on FLUIDS and sodium has improved to 153, would resume dextrose fluids for 24 hours more and start her on free water boluses and tube feeds through G TUBE.   2. LLL pneumonia: Started on antibiotics. D/c IV vancomycin as cultures have been negative so far. Would resume cefepime.   Dehydration: Started on IV fluids. Improving.   Hypertension: BP parameters much better this am. Off any bP meds.   Dementia: no agitation.   Acute renal failure; possibly from dehydration and decreased po intake. Much improved.  Creatinine is Close to baseline.  Failure to thrive: G TUBE placed and nutrition  consulted for initiation of tube feeds.    Leukocytosis: probably from the pneumonia. Resolved.   Proteus UTI;  Should be covered with cefepime.    Low  TSh and elevated free t4: - on 175 mcg of synthroid at the facility , decreased the dose of 150 mcg . - stopped lithium for now as it might be causing hyperthyroidism.       Code Status: full code.  Family Communication: none at bedside,.  Disposition Plan: back to SNF in 1 to 2 days.    Consultants:  none  Procedures:  none  Antibiotics:  Dysphagia 1 diet.   HPI/Subjective: Talking and requesting food.  Denies any pain or sob.   Objective: Filed Vitals:   10/29/14 1600  BP: 103/74  Pulse:   Temp:   Resp: 14    Intake/Output Summary (Last 24 hours) at 10/29/14 1716 Last data filed at 10/29/14 1600  Gross per 24 hour  Intake 3537.92 ml  Output   2425 ml  Net 1112.92 ml   Filed Weights   10/26/14 2028 10/27/14 0608 10/29/14 0000  Weight: 58.2 kg (128 lb 4.9 oz) 58.3 kg (128 lb 8.5 oz) 61.6 kg (135 lb 12.9 oz)    Exam:   General:  Alert but confused  Cardiovascular: s1s2  Respiratory: diminished at bases. No wheezing or rhonchi.   Abdomen: soft non tender non distended bowel sounds heard  Musculoskeletal: no pedal edema.   Data Reviewed: Basic Metabolic Panel:  Recent Labs Lab 10/27/14 0900 10/27/14 1925 10/28/14 0450 10/28/14 2151 10/29/14 0935  NA 162* 159* 157* 154* 153*  K 3.6 3.4* 3.4* 3.5 4.0  CL 127* 125* 124* 122* 121*  CO2 22 26 25 27 26   GLUCOSE 73 156* 130* 97 130*  BUN 26* 24* 21*  18 14  CREATININE 1.30* 1.39* 1.37* 1.10* 0.96  CALCIUM 9.7 9.9 9.7 9.1 9.0   Liver Function Tests:  Recent Labs Lab 10/26/14 1704 10/27/14 0245  AST 33 31  ALT 24 23  ALKPHOS 63 61  BILITOT 1.0 1.0  PROT 6.2* 5.2*  ALBUMIN 3.1* 2.8*   No results for input(s): LIPASE, AMYLASE in the last 168 hours.  Recent Labs Lab 10/29/14 0230  AMMONIA 38*   CBC:  Recent Labs Lab 10/26/14 1704 10/27/14 0245 10/28/14 2151  WBC 12.6* 12.8* 8.0  NEUTROABS 10.5*  --   --   HGB 11.9* 12.2 9.9*  HCT 41.4 41.4  39.4 33.0*  MCV 100.2* 100.5*  100.3*  PLT 243 232 162   Cardiac Enzymes:  Recent Labs Lab 10/26/14 1704 10/26/14 2115 10/27/14 0245 10/27/14 0855  TROPONINI 0.04* 0.03 0.04* 0.04*   BNP (last 3 results) No results for input(s): BNP in the last 8760 hours.  ProBNP (last 3 results) No results for input(s): PROBNP in the last 8760 hours.  CBG:  Recent Labs Lab 10/27/14 0741 10/28/14 0234 10/28/14 0739 10/29/14 0535 10/29/14 0751  GLUCAP 74 113* 128* 119* 96    Recent Results (from the past 240 hour(s))  Culture, blood (routine x 2)     Status: None (Preliminary result)   Collection Time: 10/26/14  5:04 PM  Result Value Ref Range Status   Specimen Description BLOOD BLOOD LEFT FOREARM  Final   Special Requests BOTTLES DRAWN AEROBIC AND ANAEROBIC 5 CC EA  Final   Culture   Final           BLOOD CULTURE RECEIVED NO GROWTH TO DATE CULTURE WILL BE HELD FOR 5 DAYS BEFORE ISSUING A FINAL NEGATIVE REPORT Performed at [facility]    Report Status PENDING  Incomplete  Culture, Urine     Status: None   Collection Time: 10/26/14  6:27 PM  Result Value Ref Range Status   Specimen Description URINE, CATHETERIZED  Final   Special Requests NONE  Final   Colony Count   Final    >=100,000 COLONIES/ML Performed at [facility]    Culture   Final    PROTEUS MIRABILIS Performed at [facility]    Report Status 10/29/2014 FINAL  Final   Organism ID, Bacteria PROTEUS MIRABILIS  Final      Susceptibility   Proteus mirabilis - MIC*    AMPICILLIN 4 SENSITIVE Sensitive     CEFAZOLIN <=4 SENSITIVE Sensitive     CEFTRIAXONE <=1 SENSITIVE Sensitive     CIPROFLOXACIN >=4 RESISTANT Resistant     GENTAMICIN >=16 RESISTANT Resistant     LEVOFLOXACIN >=8 RESISTANT Resistant     NITROFURANTOIN 128 RESISTANT Resistant     TOBRAMYCIN 8 INTERMEDIATE Intermediate     TRIMETH/SULFA >=320 RESISTANT Resistant     PIP/TAZO <=4 SENSITIVE Sensitive     * PROTEUS MIRABILIS  MRSA PCR Screening      Status: Abnormal   Collection Time: 10/26/14  8:51 PM  Result Value Ref Range Status   MRSA by PCR POSITIVE (A) NEGATIVE Final    Comment:        The GeneXpert MRSA Assay (FDA approved for NASAL specimens only), is one component of a comprehensive MRSA colonization surveillance program. It is not intended to diagnose MRSA infection nor to guide or monitor treatment for MRSA infections. RESULT CALLED TO, READ BACK BY AND VERIFIED WITH: J.RIMANDO,RN AT 2222 ON 10/26/14 BY [facility]  Culture, blood (routine x 2)     Status: None (Preliminary result)   Collection Time: 10/26/14  9:52 PM  Result Value Ref Range Status   Specimen Description BLOOD RAC  Final   Special Requests BOTTLES DRAWN AEROBIC ONLY 3CC  Final   Culture   Final           BLOOD CULTURE RECEIVED NO GROWTH TO DATE CULTURE WILL BE HELD FOR 5 DAYS BEFORE ISSUING A FINAL NEGATIVE REPORT Performed at [facility]    Report Status PENDING  Incomplete     Studies: Ct Head Wo Contrast  10/28/2014   CLINICAL DATA:  Rapid response.  Altered mental status.  EXAM: CT HEAD WITHOUT CONTRAST  TECHNIQUE: Contiguous axial images were obtained from the base of the skull through the vertex without intravenous contrast.  COMPARISON:  10/26/2014; 09/22/2014  FINDINGS: Similar findings of atrophy with diffuse sulcal prominence. Scattered periventricular hypodensities compatible with microvascular ischemic disease. 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 Given background parenchymal abnormalities, there is no CT evidence of acute large territory infarct. No intraparenchymal or extra-axial mass or hemorrhage. Unchanged size and configuration of the ventricles and basilar cisterns. No midline shift. Intracranial atherosclerosis. There is underpneumatization of the bilateral frontal sinuses. The remaining paranasal sinuses and mastoid air cells are  normally aerated. No air-fluid levels. Regional soft tissues appear normal. No displaced calvarial fracture.  IMPRESSION: Similar findings of atrophy and microvascular ischemic disease without acute intracranial process.   Electronically Signed   By: Simonne Come M.D.   On: 10/28/2014 20:53   Ir Gastrostomy Tube Mod Sed  10/28/2014   CLINICAL DATA:  Altered mental status and hypernatremia.  EXAM: PERCUTANEOUS GASTROSTOMY TUBE WITH FLUOROSCOPIC GUIDANCE  Physician: Rachelle Hora. Henn, MD  FLUOROSCOPY TIME:  4 minutes and 18 seconds, 36 mGy  MEDICATIONS AND MEDICAL HISTORY: 0.5 mg Versed. 0.5 mg glucagon. The patient is already on antibiotics.  ANESTHESIA/SEDATION: Patient was continuously monitored by a radiology nurse during the procedure.  PROCEDURE: Informed consent was obtained for a percutaneous gastrostomy tube by the patient's son. The patient was placed on the interventional table. Fluoroscopy demonstrated gas and stool in the transverse colon. An orogastric tube was placed with fluoroscopic guidance. The anterior abdomen was prepped and draped in sterile fashion. Maximal barrier sterile technique was utilized including caps, mask, sterile gowns, sterile gloves, sterile drape, hand hygiene and skin antiseptic. Stomach was inflated with air through the orogastric tube. The skin and subcutaneous tissues were anesthetized with 1% lidocaine. A 17 gauge needle was directed into the distended stomach with fluoroscopic guidance. A wire was advanced into the stomach and a T-tact was deployed. A 9-French vascular sheath was placed and the orogastric tube was snared using a Gooseneck snare device. The orogastric tube and snare were pulled out of the patient's mouth. The snare device was connected to a 20-French gastrostomy tube. The snare device and gastrostomy tube were pulled through the patient's mouth and out the anterior abdominal wall. The gastrostomy tube was cut to an appropriate length. Contrast injection through  gastrostomy tube confirmed placement within the stomach. Fluoroscopic images were obtained for documentation. The gastrostomy tube was flushed with normal saline.  FINDINGS: Gastrostomy tube within the stomach.  Estimated blood loss: Minimal  COMPLICATIONS: None  IMPRESSION: Successful fluoroscopic guided percutaneous gastrostomy tube placement.   Electronically Signed   By: Meriel Pica.D.  On: 10/28/2014 14:17   Dg Chest Port 1 View  10/28/2014   CLINICAL DATA:  Altered mental status and pneumonia  EXAM: PORTABLE CHEST - 1 VIEW  COMPARISON:  10/26/2014  FINDINGS: A retrocardiac opacity is streaky and stable. Normal heart size and stable mediastinal contours. Pneumoperitoneum as expected after percutaneous gastrostomy tube earlier the same day. No edema, effusion, or pneumothorax.  IMPRESSION: 1. Unchanged retrocardiac atelectasis or pneumonia. 2. Pneumoperitoneum, expected after percutaneous gastrostomy tube earlier today.   Electronically Signed   By: Marnee SpringJonathon  Watts M.D.   On: 10/28/2014 21:04    Scheduled Meds: . acidophilus  1 capsule Oral Daily  . amLODipine  5 mg Oral Q breakfast  . antiseptic oral rinse  7 mL Mouth Rinse q12n4p  . aspirin  81 mg Oral Daily  . B-complex with vitamin C  1 tablet Oral Q breakfast  . ceFEPime (MAXIPIME) IV  1 g Intravenous Q12H  . chlorhexidine  15 mL Mouth Rinse BID  . Chlorhexidine Gluconate Cloth  6 each Topical Q0600  . cholecalciferol  1,000 Units Oral Q breakfast  . feeding supplement (ENSURE ENLIVE)  237 mL Oral TID BM  . ferrous sulfate  325 mg Oral Q breakfast  . folic acid  1 mg Oral Daily  . levothyroxine  150 mcg Oral QAC breakfast  . memantine  5 mg Oral BID  . multivitamin with minerals  1 tablet Oral Daily  . mupirocin ointment  1 application Nasal BID  . pantoprazole sodium  40 mg Oral Daily  . sodium chloride  10-40 mL Intracatheter Q12H  . sodium chloride  3 mL Intravenous Q12H  . thiamine  100 mg Oral Daily  . vancomycin  1,000 mg  Intravenous Q24H  . Warfarin - Pharmacist Dosing Inpatient   Does not apply q1800   Continuous Infusions: . dextrose 5 % and 0.45 % NaCl with KCl 40 mEq/L 75 mL/hr at 10/29/14 1145  . feeding supplement (JEVITY 1.2 CAL) 1,000 mL (10/29/14 1520)    Principal Problem:   Hypernatremia Active Problems:   Bipolar 1 disorder   Acute kidney injury   Dehydration   Essential hypertension   Dyslipidemia   Dementia   LLL pneumonia   Malnutrition of moderate degree   Protein-calorie malnutrition, severe    Time spent: 25 minutes.     [facility] CenterKULA,Eddye Broxterman  [facility] Pager [phone]. If 7PM-7AM, please contact night-coverage at [email or web address], password [email or web address] 10/29/2014, 5:16 PM  LOS: 3 days

## 2014-10-29 NOTE — Progress Notes (Signed)
Patient ID: [name] [name], female   DOB: Jun 16, 1940, 74 y.o.   MRN: [id number]    Referring Physician(s): [facility]  Subjective:  Pt awake, FC; not completely oriented; denies sig abd pain  Allergies: Antihistamine decongestant; Triprolidine-pseudoephedrine; and Codeine  Medications: Prior to Admission medications   Medication Sig Start Date End Date Taking? Authorizing Provider  amLODipine (NORVASC) 5 MG tablet Take 5 mg by mouth daily with breakfast.    Yes Historical Provider, MD  Asenapine Maleate 10 MG SUBL Place 10 mg under the tongue 2 (two) times daily. 10/19/14 11/18/14 Yes Historical Provider, MD  Dietary Management Product (ENLYTE PO) Take 1 tablet by mouth daily.   Yes Historical Provider, MD  ferrous sulfate 325 (65 FE) MG tablet Take 325 mg by mouth daily with breakfast.   Yes Historical Provider, MD  L-Methylfolate-B12-B6-B2 (CEREFOLIN PO) Take 1 tablet by mouth daily.   Yes Historical Provider, MD  levothyroxine (SYNTHROID, LEVOTHROID) 175 MCG tablet Take 175 mcg by mouth daily before breakfast.   Yes Historical Provider, MD  lithium carbonate 300 MG capsule Take 300 mg by mouth 2 (two) times daily. 10/19/14  Yes Historical Provider, MD  memantine (NAMENDA) 5 MG tablet Take 5 mg by mouth 2 (two) times daily.   Yes Historical Provider, MD  warfarin (COUMADIN) 1 MG tablet Take 3.5 mg by mouth daily. 1700   Yes Historical Provider, MD  acetaminophen (TYLENOL) 160 MG/5ML solution Take 20.3 mLs (650 mg total) by mouth every 6 (six) hours as needed for mild pain, headache or fever. Patient not taking: Reported on 07/26/2014 06/25/14   Alison Murray, MD  albuterol (PROVENTIL) (2.5 MG/3ML) 0.083% nebulizer solution Take 3 mLs (2.5 mg total) by nebulization every 4 (four) hours as needed for wheezing. Patient not taking: Reported on 09/22/2014 06/25/14   Alison Murray, MD  aspirin 81 MG chewable tablet Chew 1 tablet (81 mg total) by mouth daily. Patient not taking: Reported on 09/22/2014  06/25/14   Alison Murray, MD  feeding supplement, RESOURCE BREEZE, (RESOURCE BREEZE) LIQD Take 1 Container by mouth 3 (three) times daily between meals. Patient not taking: Reported on 07/26/2014 06/25/14   Alison Murray, MD  furosemide (LASIX) 20 MG tablet Take 1 tablet (20 mg total) by mouth daily. Patient not taking: Reported on 09/22/2014 08/06/14   Rhetta Mura, MD  lamoTRIgine (LAMICTAL) 25 MG tablet Take 2 tablets (50 mg total) by mouth 2 (two) times daily. Patient not taking: Reported on 09/22/2014 08/06/14   Rhetta Mura, MD  LORazepam (ATIVAN) 0.5 MG tablet Take 1 tablet (0.5 mg total) by mouth every 6 (six) hours as needed for anxiety (and agitation). Patient not taking: Reported on 10/26/2014 08/06/14   Rhetta Mura, MD  Maltodextrin-Xanthan Gum (RESOURCE THICKENUP CLEAR) POWD Use as indicated. Patient not taking: Reported on 07/26/2014 05/03/14   Kathlen Mody, MD  pantoprazole sodium (PROTONIX) 40 mg/20 mL PACK Take 20 mLs (40 mg total) by mouth daily. Patient not taking: Reported on 07/26/2014 06/25/14   Alison Murray, MD  senna (SENOKOT) 8.6 MG TABS tablet Take 1 tablet (8.6 mg total) by mouth 2 (two) times daily. Patient not taking: Reported on 09/22/2014 05/03/14   Kathlen Mody, MD  traMADol (ULTRAM) 50 MG tablet Take 1 tablet (50 mg total) by mouth every 6 (six) hours as needed (for pain). 08/06/14   Rhetta Mura, MD     Vital Signs: BP 106/52 mmHg  Pulse 58  Temp(Src) 97.6 F (36.4 C) (Oral)  Resp 14  Ht 5\' 6"  (1.676 m)  Wt 135 lb 12.9 oz (61.6 kg)  BMI 21.93 kg/m2  SpO2 96%  Physical Exam G tube intact, insertion site ok, minimal tenderness, no hematoma  Imaging: Ct Head Wo Contrast  10/28/2014   CLINICAL DATA:  Rapid response.  Altered mental status.  EXAM: CT HEAD WITHOUT CONTRAST  TECHNIQUE: Contiguous axial images were obtained from the base of the skull through the vertex without intravenous contrast.  COMPARISON:  10/26/2014; 09/22/2014  FINDINGS:  Similar findings of atrophy with diffuse sulcal prominence. Scattered periventricular hypodensities compatible with microvascular ischemic disease. 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 Given background parenchymal abnormalities, there is no CT evidence of acute large territory infarct. No intraparenchymal or extra-axial mass or hemorrhage. Unchanged size and configuration of the ventricles and basilar cisterns. No midline shift. Intracranial atherosclerosis. There is underpneumatization of the bilateral frontal sinuses. The remaining paranasal sinuses and mastoid air cells are normally aerated. No air-fluid levels. Regional soft tissues appear normal. No displaced calvarial fracture.  IMPRESSION: Similar findings of atrophy and microvascular ischemic disease without acute intracranial process.   Electronically Signed   By: Simonne ComeJohn  Watts M.D.   On: 10/28/2014 20:53   Ct Head Wo Contrast  10/26/2014   CLINICAL DATA:  Altered mental status with lethargy and hypernatremia  EXAM: CT HEAD WITHOUT CONTRAST  TECHNIQUE: Contiguous axial images were obtained from the base of the skull through the vertex without intravenous contrast.  COMPARISON:  September 22, 2014  FINDINGS: There is age related volume loss. There is no intracranial mass, hemorrhage, extra-axial fluid collection, or midline shift. There is slight small vessel disease adjacent to the frontal horns of the lateral ventricles. Elsewhere gray-white compartments appear normal. No acute infarct is apparent. The bony calvarium appears intact. The mastoid air cells are clear. There is debris in the left external auditory canal.  IMPRESSION: Minimal periventricular small vessel disease. No intracranial mass, hemorrhage, or acute appearing infarct. There is probable cerumen in the left external auditory canal.   Electronically Signed   By: Bretta BangWilliam  Woodruff III M.D.   On:  10/26/2014 17:27   Ir Gastrostomy Tube Mod Sed  10/28/2014   CLINICAL DATA:  Altered mental status and hypernatremia.  EXAM: PERCUTANEOUS GASTROSTOMY TUBE WITH FLUOROSCOPIC GUIDANCE  Physician: Rachelle HoraAdam R. Henn, MD  FLUOROSCOPY TIME:  4 minutes and 18 seconds, 36 mGy  MEDICATIONS AND MEDICAL HISTORY: 0.5 mg Versed. 0.5 mg glucagon. The patient is already on antibiotics.  ANESTHESIA/SEDATION: Patient was continuously monitored by a radiology nurse during the procedure.  PROCEDURE: Informed consent was obtained for a percutaneous gastrostomy tube by the patient's son. The patient was placed on the interventional table. Fluoroscopy demonstrated gas and stool in the transverse colon. An orogastric tube was placed with fluoroscopic guidance. The anterior abdomen was prepped and draped in sterile fashion. Maximal barrier sterile technique was utilized including caps, mask, sterile gowns, sterile gloves, sterile drape, hand hygiene and skin antiseptic. Stomach was inflated with air through the orogastric tube. The skin and subcutaneous tissues were anesthetized with 1% lidocaine. A 17 gauge needle was directed into the distended stomach with fluoroscopic guidance. A wire was advanced into the stomach and a T-tact was deployed. A 9-French vascular sheath was placed and the orogastric tube was snared using a Gooseneck snare device. The orogastric tube and snare were pulled out of the patient's mouth. The snare  device was connected to a 20-French gastrostomy tube. The snare device and gastrostomy tube were pulled through the patient's mouth and out the anterior abdominal wall. The gastrostomy tube was cut to an appropriate length. Contrast injection through gastrostomy tube confirmed placement within the stomach. Fluoroscopic images were obtained for documentation. The gastrostomy tube was flushed with normal saline.  FINDINGS: Gastrostomy tube within the stomach.  Estimated blood loss: Minimal  COMPLICATIONS: None   IMPRESSION: Successful fluoroscopic guided percutaneous gastrostomy tube placement.   Electronically Signed   By: Richarda Overlie M.D.   On: 10/28/2014 14:17   Dg Chest Port 1 View  10/28/2014   CLINICAL DATA:  Altered mental status and pneumonia  EXAM: PORTABLE CHEST - 1 VIEW  COMPARISON:  10/26/2014  FINDINGS: A retrocardiac opacity is streaky and stable. Normal heart size and stable mediastinal contours. Pneumoperitoneum as expected after percutaneous gastrostomy tube earlier the same day. No edema, effusion, or pneumothorax.  IMPRESSION: 1. Unchanged retrocardiac atelectasis or pneumonia. 2. Pneumoperitoneum, expected after percutaneous gastrostomy tube earlier today.   Electronically Signed   By: Marnee Spring M.D.   On: 10/28/2014 21:04   Dg Chest Port 1 View  10/26/2014   CLINICAL DATA:  Altered mental status, height burden a tree me of  EXAM: PORTABLE CHEST - 1 VIEW  COMPARISON:  Portable chest x-ray of August 23, 2014  FINDINGS: The lungs are adequately inflated. There are mildly increased lung markings in the retrocardiac region. The left hemidiaphragm is partially obscured. The cardiac silhouette and pulmonary vascularity are normal. The bony thorax exhibits no acute abnormality.  IMPRESSION: Findings consistent with atelectasis or pneumonia in the left lower lobe. There is no CHF.   Electronically Signed   By: David  [place] M.D.   On: 10/26/2014 17:29    Labs:  CBC:  Recent Labs  08/06/14 0515 10/26/14 1704 10/27/14 0245 10/28/14 2151  WBC 6.0 12.6* 12.8* 8.0  HGB 10.2* 11.9* 12.2 9.9*  HCT 32.7* 41.4 41.4  39.4 33.0*  PLT 288 243 232 162    COAGS:  Recent Labs  10/27/14 0245 10/28/14 0450  INR 1.02 1.33    BMP:  Recent Labs  10/27/14 1925 10/28/14 0450 10/28/14 2151 10/29/14 0935  NA 159* 157* 154* 153*  K 3.4* 3.4* 3.5 4.0  CL 125* 124* 122* 121*  CO2 GLUCOSE 156* 130* 97 130*  BUN 24* 21* 18 14  CALCIUM 9.9 9.7 9.1 9.0  CREATININE 1.39* 1.37*  1.10* 0.96  GFRNONAA 36* 37* 48* 57*  GFRAA 42* 43* 56* >60    LIVER FUNCTION TESTS:  Recent Labs  08/05/14 0551 08/06/14 0515 10/26/14 1704 10/27/14 0245  BILITOT 0.4 0.2* 1.0 1.0  AST 50* 27 33 31  ALT 49* 33 24 23  ALKPHOS 145* 119* 63 61  PROT 5.7* 5.7* 6.2* 5.2*  ALBUMIN 2.8* 2.9* 3.1* 2.8*    Assessment and Plan: S/p perc G tube 5/26; ok to use tube for feeds; other plans as per IM   Signed: D. Jeananne Rama 10/29/2014, 11:26 AM   I spent a total of 15 minutes  in face to face in clinical consultation/evaluation, greater than 50% of which was counseling/coordinating care for gastrostomy tube

## 2014-10-29 NOTE — Progress Notes (Signed)
ANTIBIOTIC CONSULT NOTE - follow up  Pharmacy Consult for Vancomycin, Renal adjustment of antibiotics Indication: pneumonia  Allergies  Allergen Reactions  . Antihistamine Decongestant [Triprolidine-Pse] Diarrhea  . Triprolidine-Pseudoephedrine Diarrhea  . Codeine Other (See Comments) and Rash    Gi upset Gi upset     Patient Measurements: Height:  (167.6 cm) Weight: 135 lb 12.9 oz (61.6 kg) IBW/kg (Calculated) : 59.3 Adjusted Body Weight:   Vital Signs: Temp: 97.6 F (36.4 C) (05/27 0800) Temp Source: Oral (05/27 0800) BP: 106/52 mmHg (05/27 0900) Pulse Rate: 58 (05/27 0630) Intake/Output from previous day: 05/26 0701 - 05/27 0700 In: 2911.3 [I.V.:1361.3; IV Piggyback:1350] Out: 1025 [Urine:1025] Intake/Output from this shift: Total I/O In: -  Out: 230 [Urine:230]  Labs:  Recent Labs  10/26/14 1704 10/27/14 0245  10/27/14 1925 10/28/14 0450 10/28/14 2151  WBC 12.6* 12.8*  --   --   --  8.0  HGB 11.9* 12.2  --   --   --  9.9*  PLT 243 232  --   --   --  162  CREATININE 1.40* 1.37*  < > 1.39* 1.37* 1.10*  < > = values in this interval not displayed. Estimated Creatinine Clearance: 42 mL/min (by C-G formula based on Cr of 1.1). No results for input(s): VANCOTROUGH, VANCOPEAK, VANCORANDOM, GENTTROUGH, GENTPEAK, GENTRANDOM, TOBRATROUGH, TOBRAPEAK, TOBRARND, AMIKACINPEAK, AMIKACINTROU, AMIKACIN in the last 72 hours.   Microbiology: Recent Results (from the past 720 hour(s))  Culture, blood (routine x 2)     Status: None (Preliminary result)   Collection Time: 10/26/14  5:04 PM  Result Value Ref Range Status   Specimen Description BLOOD BLOOD LEFT FOREARM  Final   Special Requests BOTTLES DRAWN AEROBIC AND ANAEROBIC 5 CC EA  Final   Culture   Final           BLOOD CULTURE RECEIVED NO GROWTH TO DATE CULTURE WILL BE HELD FOR 5 DAYS BEFORE ISSUING A FINAL NEGATIVE REPORT Performed at [facility]    Report Status PENDING  Incomplete  Culture, Urine      Status: None (Preliminary result)   Collection Time: 10/26/14  6:27 PM  Result Value Ref Range Status   Specimen Description URINE, CATHETERIZED  Final   Special Requests NONE  Final   Colony Count   Final    >=100,000 COLONIES/ML Performed at [facility]    Culture   Final    GRAM NEGATIVE RODS Performed at [facility]    Report Status PENDING  Incomplete  MRSA PCR Screening     Status: Abnormal   Collection Time: 10/26/14  8:51 PM  Result Value Ref Range Status   MRSA by PCR POSITIVE (A) NEGATIVE Final    Comment:        The GeneXpert MRSA Assay (FDA approved for NASAL specimens only), is one component of a comprehensive MRSA colonization surveillance program. It is not intended to diagnose MRSA infection nor to guide or monitor treatment for MRSA infections. RESULT CALLED TO, READ BACK BY AND VERIFIED WITH: J.RIMANDO,RN AT 2222 ON 10/26/14 BY W.SHEA   Culture, blood (routine x 2)     Status: None (Preliminary result)   Collection Time: 10/26/14  9:52 PM  Result Value Ref Range Status   Specimen Description BLOOD RAC  Final   Special Requests BOTTLES DRAWN AEROBIC ONLY 3CC  Final   Culture   Final           BLOOD CULTURE RECEIVED NO  GROWTH TO DATE CULTURE WILL BE HELD FOR 5 DAYS BEFORE ISSUING A FINAL NEGATIVE REPORT [facility]formed at [facility]    Report Status PENDING  Incomplete    Medical History: Past Medical History  Diagnosis Date  . Hypertension   . Bipolar 1 disorder   . Hypothyroidism   . Memory difficulties   . HLD (hyperlipidemia)   . Diverticulitis large intestine   . Pneumonia      Assessment: [age] yoF presents from NH with AMS, sepsis 2/2 possible HCAP.  CXR in ED showed findings consistent with atelectasis or pneumonia in the left lower lobe.  Cefepime ordered x 1 in ED.  Pharmacy consulted to start vancomycin on 5/24  5/24 >> cefepime >> 5/24 >> vanc >>  5/24 blood: ngtd 5/24 urine: GNR  Goal of Therapy:   Vancomycin trough level 15-20 mcg/ml  Plan:  1.  Continue vancomycin 1g IV q24h for now 2.  Continue cefepime 1g q12 per current renal function 3.  Check vancomycin trough this evening prior to next dose 4.  Follow up final urine culture results and possibility of narrowing abx's   Hessie KnowsJustin M Mya Suell, PharmD, BCPS Pager [phone] 10/29/2014 10:07 AM

## 2014-10-29 NOTE — Progress Notes (Signed)
CSW continuing to follow.   CSW noted that pt transferred to SDU yesterday afternoon.   CSW followed up with pt son, Molli HazardMatthew via telephone to provide support.   [facility] once medically stable for discharge as pt is a long term resident at the facility.   CSW [name] and updated facility and notified facility that pt had PEG tube placed yesterday. CSW sent updated clinicals via [facility]. Per [name], facility can accept pt back over the weekend if pt becomes medically stable for discharge.   CSW to continue to follow to provide support and assist with [facility] return [name] when medically ready for discharge.  Loletta SpecterSuzanna Kidd, MSW, LCSW Clinical Social Work [phone]

## 2014-10-29 NOTE — Progress Notes (Signed)
Pt removed from BIPAP and placed on 4 LPM [place], Pt tolerating well at this time. RT to monitor and assess as needed.

## 2014-10-29 NOTE — Progress Notes (Signed)
Nutrition Follow-up  DOCUMENTATION CODES:  Severe malnutrition in context of chronic illness as evidenced by 25% weight loss in <6 months and moderate fat and muscle depletion.   INTERVENTION: Initiate Jevity 1.2 @ 10 ml/hr via peg and increase by 10 ml every 8 hours to goal rate of 60 ml/hr.   Tube feeding regimen provides 1728 kcal (100% of needs), 80 grams of protein, and 1166 ml of H2O.   Monitor magnesium, potassium, and phosphorus daily for at least 3 days, MD to replete as needed, as pt is at risk for refeeding syndrome given poor po for >7 days.  NUTRITION DIAGNOSIS:  Inadequate oral intake related to vomiting as evidenced by meal completion < 25%.  ongoing  GOAL:  Patient will meet greater than or equal to 90% of their needs  Not met  MONITOR:  PO intake, Supplement acceptance, Labs, Weight trends  REASON FOR ASSESSMENT:  Malnutrition Screening Tool    ASSESSMENT: 74 y.o. female with dementia HTN hyperlipidemia presents with decreased responsiveness and abnormal labs  Consult received for TF initiation and management.  PEG placement 5/26. Pt is at refeeding risk. Spoke with RN- will start TF at low rate and advance very slowly.  SLP consult pending to assess for ability to tolerate po intake.  Labs and medications reviewed  Na 153  Cl 121  RD will continue to monitor for TF tolerance.   Height:  Ht Readings from Last 1 Encounters:  10/26/14 5' 6"  (1.676 m)    Weight:  Wt Readings from Last 1 Encounters:  10/29/14 135 lb 12.9 oz (61.6 kg)    Ideal Body Weight:  59.1 kg  Wt Readings from Last 10 Encounters:  10/29/14 135 lb 12.9 oz (61.6 kg)  08/06/14 133 lb 2.5 oz (60.4 kg)  06/23/14 167 lb 12.3 oz (76.1 kg)  04/26/14 171 lb 4.8 oz (77.7 kg)    BMI:  Body mass index is 21.93 kg/(m^2).  Estimated Nutritional Needs:  Kcal:  1550-1750  Protein:  75-85 g  Fluid:  1.8 L/day  Skin:  Reviewed, no issues  Diet Order:  DIET - DYS 1 Room  service appropriate?: Yes; Fluid consistency:: Nectar Thick  EDUCATION NEEDS:  Education needs no appropriate at this time   Intake/Output Summary (Last 24 hours) at 10/29/14 1236 Last data filed at 10/29/14 1200  Gross per 24 hour  Intake 3431.25 ml  Output   1775 ml  Net 1656.25 ml    Last BM:  Prior to admission  Laurette Schimke Elliott, [place], [place]

## 2014-10-29 NOTE — Plan of Care (Signed)
Problem: SLP Dysphagia Goals Goal: Patient will utilize recommended strategies Patient will utilize recommended strategies during swallow to increase swallowing safety with  Outcome: Not Met (add Reason) ? Aspiration Proceed with MBS 5/28

## 2014-10-29 NOTE — Progress Notes (Signed)
Peripherally Inserted Central Catheter/Midline Placement  The IV Nurse has discussed with the patient and/or persons authorized to consent for the patient, the purpose of this procedure and the potential benefits and risks involved with this procedure.  The benefits include less needle sticks, lab draws from the catheter and patient may be discharged home with the catheter.  Risks include, but not limited to, infection, bleeding, blood clot (thrombus formation), and puncture of an artery; nerve damage and irregular heat beat.  Alternatives to this procedure were also discussed.  PICC/Midline Placement Documentation    Information given to son Lodema HongMathew Nish    Raihana Balderrama Renee 10/29/2014, 5:11 PM

## 2014-10-29 NOTE — Progress Notes (Signed)
At shift change day shift RN reported that the patient had become more lethargic and unresponsive, she said that the MD had been notified and would be coming up to see the patient. After report writer went to assess patient and she was found to be minimally responsive to a strong sternal rub. RRT called and NP on call notified. VSS RRT and NP at bedside. New orders given for Bolus, narcan, CT, CXR, EKG, and labs. All orders followed. After bolus NP on call notified of patient status. NP to bed side to reassess patient. Minimal change in patient condition after discussion with patients son plan to transfer patient to SDU. Report given to SDU RN receiving patient will transfer when bed available.

## 2014-10-29 NOTE — Significant Event (Signed)
Rapid Response Event Note  Overview: Time Called: 1945 Arrival Time: 1950 Event Type: Neurologic  Initial Focused Assessment:Pt unresponsive and had received a new medication at 1730.    Interventions:1 Liter bolus given. To go for head CT. Pt. To stay in room at present. The supervisor and NP to observe.   Event Summary: Name of Physician Notified: Maren ReamerKaren Kirby NP at 1945    at    Outcome: Stayed in room and stabalized (transferred at 23:30)  Event End Time: 2020  Bayard HuggerDenny, Corby Villasenor B

## 2014-10-30 ENCOUNTER — Inpatient Hospital Stay (HOSPITAL_COMMUNITY): Payer: Medicare Other

## 2014-10-30 LAB — BASIC METABOLIC PANEL WITH GFR
Anion gap: 5 (ref 5–15)
BUN: 13 mg/dL (ref 6–20)
CO2: 25 mmol/L (ref 22–32)
Calcium: 9.3 mg/dL (ref 8.9–10.3)
Chloride: 123 mmol/L — ABNORMAL HIGH (ref 101–111)
Creatinine, Ser: 0.87 mg/dL (ref 0.44–1.00)
GFR calc Af Amer: >60 mL/min
GFR calc non Af Amer: >60 mL/min
Glucose, Bld: 146 mg/dL — ABNORMAL HIGH (ref 65–99)
Potassium: 4 mmol/L (ref 3.5–5.1)
Sodium: 153 mmol/L — ABNORMAL HIGH (ref 135–145)

## 2014-10-30 LAB — GLUCOSE, CAPILLARY
GLUCOSE-CAPILLARY: 140 mg/dL — AB (ref 65–99)
GLUCOSE-CAPILLARY: 111 mg/dL — AB (ref 65–99)
Glucose-Capillary: 104 mg/dL — ABNORMAL HIGH (ref 65–99)
Glucose-Capillary: 111 mg/dL — ABNORMAL HIGH (ref 65–99)
Glucose-Capillary: 119 mg/dL — ABNORMAL HIGH (ref 65–99)
Glucose-Capillary: 130 mg/dL — ABNORMAL HIGH (ref 65–99)

## 2014-10-30 LAB — URINE CULTURE
Colony Count: NO GROWTH
Culture: NO GROWTH

## 2014-10-30 LAB — PROTIME-INR
INR: 2.21 — ABNORMAL HIGH (ref 0.00–1.49)
Prothrombin Time: 24.3 s — ABNORMAL HIGH (ref 11.6–15.2)

## 2014-10-30 MED ORDER — FREE WATER
200.0000 mL | Freq: Three times a day (TID) | Status: DC
  Administered 2014-10-31 – 2014-11-02 (×7): 200 mL

## 2014-10-30 MED ORDER — WARFARIN SODIUM 2.5 MG PO TABS
2.5000 mg | ORAL_TABLET | Freq: Once | ORAL | Status: AC
  Administered 2014-10-30: 2.5 mg via ORAL
  Filled 2014-10-30: qty 1

## 2014-10-30 NOTE — Progress Notes (Signed)
Speech Language Pathology Treatment: Dysphagia  Patient Details Name: [name] [name] MRN: [id number] DOB: June 14, 1940 Today's Date: 10/30/2014 Time: [id number] SLP Time Calculation (min) (ACUTE ONLY): 16 min  Assessment / Plan / Recommendation Clinical Impression  Pt scheduled for MBS this afternoon, however upon arrival to radiology is presenting with decreased level of alertness, not very communicative, tearful, and intermittently grimacing in pain as she reaches for her abdomen around PEG site. Called RN, who reports that pt was alert, pleasant, and singing prior to transport. Attempted to perform oral care and provide diagnostic PO trials to determine if pt was appropriate for MBS today. Pt requires Max-Total A for oral acceptance of POs due to limited awareness and alertness, with limited labial seal and residuals remaining in posterior oral cavity. Very limited trials provided before pt began to reach out in pain again. Pt was transported back to her room by two radiology techs for further assessment from RN. Will f/u as able for completion of MBS - pt does have PEG in the interim for medication/nutritional access.   HPI Other Pertinent Information: 74 [facility] female [facility] with decreased responsiveness and hypernatremia.  Found to have left lower lobe pna.  PMH + for dysphagia, bipolar d/o, dementia, HLD, HTN.  Last swallow evaluation completed during February admit with trace aspiration of thin - silent- due to premature spillage into larynx prior to swallow trigger.  Pt reported preferring nectar liquids and pureed foods at that time due to her dysphagia - coughing with liquids and decreased ability to masticate solids due to dentition.  CT head negative.     Pertinent Vitals Pain Assessment: Faces Faces Pain Scale: Hurts worst Pain Location: grabbing abdomen Pain Intervention(s): Other (comment);Limited activity within patient's tolerance (RN called and made aware)  SLP Plan  MBS (when ready)     Recommendations Diet recommendations: NPO Medication Administration: Via alternative means       Oral Care Recommendations: Oral care QID Follow up Recommendations: Skilled Nursing facility Plan: MBS (when ready)    Maxcine HamLaura Paiewonsky, M.A. CCC-SLP [phone]  Maxcine Hamaiewonsky, Jermery Caratachea 10/30/2014, 4:15 PM

## 2014-10-30 NOTE — Progress Notes (Signed)
[facility] PROGRESS NOTE  [name] [name] ZOX:[id number] DOB: September 13, 1940 DOA: 10/26/2014 PCP: Astrid Divine, MD Interim summary: 74 y.o.old lady with h/o dementia, hypertension, comes in for altered mental status and abnormal labs.  She was found to be hypernatremic, has a LLL pneumonia and UTI. She was started on broad spectrum antibiotics . Family requested G tube placement as she has been previously admitted for similar complaints, decreased po intake and dehydration . IR consulted and she underwent G tube placement on 5/26. Few hours after the G tube placement, patient became more lethargic probably from the versad and saphris. Over night patient became more lethargic, hypotensive and hypoxic and was transferred to Step down. She underwent a CT head , which did not reveal any acute abnormality. She was briefly on BIPAP, but was later on transitioned to [place] oxygen. Overnight she received a litre fluid bolus.  On the am of 5/27,  she is alert and conversing and requesting breakfast.  Speech evaluation with MBS ORDERED.   Assessment/Plan: 1. Hypernatremia: Possibly from decreased/ none oral intake.  Started on FLUIDS and sodium has improved to 153, stop the dextrose fluids and start free water through the G TUBE.  - speech consulted and recommendations given. Continue with NPO.  2. LLL pneumonia: Started on antibiotics. D/c IV vancomycin as cultures have been negative so far. Would resume cefepime and transition to oral levaquin to complete the course. .   Dehydration: Started on IV fluids. Improved  Hypertension: BP parameters much better this am. Off any bP meds.   Dementia: no agitation.   Acute renal failure; possibly from dehydration and decreased po intake. Much improved.  Creatinine is Close to baseline.  Failure to thrive: G TUBE placed and nutrition  consulted for initiation of tube feeds.    Leukocytosis: probably from the pneumonia. Resolved.   Proteus UTI;   Should be covered with cefepime.    Low TSh and elevated free t4: - on 175 mcg of synthroid at the facility , decreased the dose of 150 mcg . - stopped lithium for now as it might be causing hyperthyroidism.       Code Status: full code.  Family Communication: none at bedside,. Discussed with son over the phone.  Disposition Plan: back to SNF in 1 to 2 days.    Consultants:  none  Procedures:  none  Antibiotics:  Cefepime.    HPI/Subjective: Sleeping comfortably. Woke up    Objective: Filed Vitals:   10/30/14 1421  BP: 111/61  Pulse: 63  Temp: 98.2 F (36.8 C)  Resp: 16    Intake/Output Summary (Last 24 hours) at 10/30/14 1715 Last data filed at 10/30/14 1300  Gross per 24 hour  Intake    880 ml  Output   2150 ml  Net  -1270 ml   Filed Weights   10/27/14 0608 10/29/14 0000 10/29/14 1900  Weight: 58.3 kg (128 lb 8.5 oz) 61.6 kg (135 lb 12.9 oz) 62.9 kg (138 lb 10.7 oz)    Exam:   General:  Alert but confused  Cardiovascular: s1s2  Respiratory: diminished at bases. No wheezing or rhonchi.   Abdomen: soft non tender non distended bowel sounds heard, G tube in place.   Musculoskeletal: no pedal edema.   Data Reviewed: Basic Metabolic Panel:  Recent Labs Lab 10/27/14 1925 10/28/14 0450 10/28/14 2151 10/29/14 0935 10/30/14 0515  NA 159* 157* 154* 153* 153*  K 3.4* 3.4* 3.5 4.0 4.0  CL 125* 124* 122* 121* 123*  CO2 26 25 27 26 25   GLUCOSE 156* 130* 97 130* 146*  BUN 24* 21* 18 14 13   CREATININE 1.39* 1.37* 1.10* 0.96 0.87  CALCIUM 9.9 9.7 9.1 9.0 9.3   Liver Function Tests:  Recent Labs Lab 10/26/14 1704 10/27/14 0245  AST 33 31  ALT 24 23  ALKPHOS 63 61  BILITOT 1.0 1.0  PROT 6.2* 5.2*  ALBUMIN 3.1* 2.8*   No results for input(s): LIPASE, AMYLASE in the last 168 hours.  Recent Labs Lab 10/29/14 0230  AMMONIA 38*   CBC:  Recent Labs Lab 10/26/14 1704 10/27/14 0245 10/28/14 2151  WBC 12.6* 12.8* 8.0  NEUTROABS  10.5*  --   --   HGB 11.9* 12.2 9.9*  HCT 41.4 41.4  39.4 33.0*  MCV 100.2* 100.5* 100.3*  PLT 243 232 162   Cardiac Enzymes:  Recent Labs Lab 10/26/14 1704 10/26/14 2115 10/27/14 0245 10/27/14 0855  TROPONINI 0.04* 0.03 0.04* 0.04*   BNP (last 3 results) No results for input(s): BNP in the last 8760 hours.  ProBNP (last 3 results) No results for input(s): PROBNP in the last 8760 hours.  CBG:  Recent Labs Lab 10/30/14 0010 10/30/14 0406 10/30/14 0745 10/30/14 1142 10/30/14 1624  GLUCAP 119* 140* 111* 130* 111*    Recent Results (from the past 240 hour(s))  Culture, blood (routine x 2)     Status: None (Preliminary result)   Collection Time: 10/26/14  5:04 PM  Result Value Ref Range Status   Specimen Description BLOOD BLOOD LEFT FOREARM  Final   Special Requests BOTTLES DRAWN AEROBIC AND ANAEROBIC 5 CC EA  Final   Culture   Final           BLOOD CULTURE RECEIVED NO GROWTH TO DATE CULTURE WILL BE HELD FOR 5 DAYS BEFORE ISSUING A FINAL NEGATIVE REPORT Performed at [facility]    Report Status PENDING  Incomplete  Culture, Urine     Status: None   Collection Time: 10/26/14  6:27 PM  Result Value Ref Range Status   Specimen Description URINE, CATHETERIZED  Final   Special Requests NONE  Final   Colony Count   Final    >=100,000 COLONIES/ML Performed at [facility]    Culture   Final    PROTEUS MIRABILIS Performed at [facility]    Report Status 10/29/2014 FINAL  Final   Organism ID, Bacteria PROTEUS MIRABILIS  Final      Susceptibility   Proteus mirabilis - MIC*    AMPICILLIN 4 SENSITIVE Sensitive     CEFAZOLIN <=4 SENSITIVE Sensitive     CEFTRIAXONE <=1 SENSITIVE Sensitive     CIPROFLOXACIN >=4 RESISTANT Resistant     GENTAMICIN >=16 RESISTANT Resistant     LEVOFLOXACIN >=8 RESISTANT Resistant     NITROFURANTOIN 128 RESISTANT Resistant     TOBRAMYCIN 8 INTERMEDIATE Intermediate     TRIMETH/SULFA >=320 RESISTANT Resistant      PIP/TAZO <=4 SENSITIVE Sensitive     * PROTEUS MIRABILIS  MRSA PCR Screening     Status: Abnormal   Collection Time: 10/26/14  8:51 PM  Result Value Ref Range Status   MRSA by PCR POSITIVE (A) NEGATIVE Final    Comment:        The GeneXpert MRSA Assay (FDA approved for NASAL specimens only), is one component of a comprehensive MRSA colonization surveillance program. It is not intended to diagnose MRSA infection nor to guide or monitor treatment for MRSA infections.  RESULT CALLED TO, READ BACK BY AND VERIFIED WITH: J.RIMANDO,RN AT 2222 ON 10/26/14 BY W.SHEA   Culture, blood (routine x 2)     Status: None (Preliminary result)   Collection Time: 10/26/14  9:52 PM  Result Value Ref Range Status   Specimen Description BLOOD RAC  Final   Special Requests BOTTLES DRAWN AEROBIC ONLY 3CC  Final   Culture   Final           BLOOD CULTURE RECEIVED NO GROWTH TO DATE CULTURE WILL BE HELD FOR 5 DAYS BEFORE ISSUING A FINAL NEGATIVE REPORT Performed at [facility]    Report Status PENDING  Incomplete     Studies: Ct Head Wo Contrast  10/28/2014   CLINICAL DATA:  Rapid response.  Altered mental status.  EXAM: CT HEAD WITHOUT CONTRAST  TECHNIQUE: Contiguous axial images were obtained from the base of the skull through the vertex without intravenous contrast.  COMPARISON:  10/26/2014; 09/22/2014  FINDINGS: Similar findings of atrophy with diffuse sulcal prominence. Scattered periventricular hypodensities compatible with microvascular ischemic disease. 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 Given background parenchymal abnormalities, there is no CT evidence of acute large territory infarct. No intraparenchymal or extra-axial mass or hemorrhage. Unchanged size and configuration of the ventricles and basilar cisterns. No midline shift. Intracranial atherosclerosis. There is underpneumatization of the  bilateral frontal sinuses. The remaining paranasal sinuses and mastoid air cells are normally aerated. No air-fluid levels. Regional soft tissues appear normal. No displaced calvarial fracture.  IMPRESSION: Similar findings of atrophy and microvascular ischemic disease without acute intracranial process.   Electronically Signed   By: Simonne ComeJohn  Watts M.D.   On: 10/28/2014 20:53   Dg Chest Port 1 View  10/28/2014   CLINICAL DATA:  Altered mental status and pneumonia  EXAM: PORTABLE CHEST - 1 VIEW  COMPARISON:  10/26/2014  FINDINGS: A retrocardiac opacity is streaky and stable. Normal heart size and stable mediastinal contours. Pneumoperitoneum as expected after percutaneous gastrostomy tube earlier the same day. No edema, effusion, or pneumothorax.  IMPRESSION: 1. Unchanged retrocardiac atelectasis or pneumonia. 2. Pneumoperitoneum, expected after percutaneous gastrostomy tube earlier today.   Electronically Signed   By: Marnee SpringJonathon  Watts M.D.   On: 10/28/2014 21:04    Scheduled Meds: . acidophilus  1 capsule Oral Daily  . amLODipine  5 mg Oral Q breakfast  . antiseptic oral rinse  7 mL Mouth Rinse q12n4p  . aspirin  81 mg Oral Daily  . B-complex with vitamin C  1 tablet Oral Q breakfast  . ceFEPime (MAXIPIME) IV  1 g Intravenous Q12H  . chlorhexidine  15 mL Mouth Rinse BID  . Chlorhexidine Gluconate Cloth  6 each Topical Q0600  . cholecalciferol  1,000 Units Oral Q breakfast  . feeding supplement (ENSURE ENLIVE)  237 mL Oral TID BM  . ferrous sulfate  325 mg Oral Q breakfast  . folic acid  1 mg Oral Daily  . levothyroxine  150 mcg Oral QAC breakfast  . memantine  5 mg Oral BID  . multivitamin with minerals  1 tablet Oral Daily  . mupirocin ointment  1 application Nasal BID  . pantoprazole sodium  40 mg Oral Daily  . sodium chloride  10-40 mL Intracatheter Q12H  . sodium chloride  3 mL Intravenous Q12H  . thiamine  100 mg Oral Daily  . Warfarin - Pharmacist Dosing Inpatient   Does not  apply q1800    Continuous Infusions: . dextrose 5 % and 0.45 % NaCl with KCl 40 mEq/L 75 mL/hr at 10/29/14 1145  . feeding supplement (JEVITY 1.2 CAL) 1,000 mL (10/29/14 1520)    Principal Problem:   Hypernatremia Active Problems:   Bipolar 1 disorder   Acute kidney injury   Dehydration   Essential hypertension   Dyslipidemia   Dementia   LLL pneumonia   Malnutrition of moderate degree   Protein-calorie malnutrition, severe    Time spent: 25 minutes.     [facility]  [facility] Pager [phone]. If 7PM-7AM, please contact night-coverage at [email or web address], password [facility] 10/30/2014, 5:15 PM  LOS: 4 days

## 2014-10-30 NOTE — Progress Notes (Signed)
ANTICOAGULATION CONSULT NOTE - Follow Up Consult  Pharmacy Consult for Warfarin Indication: Recent DVT  Allergies  Allergen Reactions  . Antihistamine Decongestant [Triprolidine-Pse] Diarrhea  . Triprolidine-Pseudoephedrine Diarrhea  . Codeine Other (See Comments) and Rash    Gi upset Gi upset     Patient Measurements: Height:  (162.6 cm) Weight: 138 lb 10.7 oz (62.9 kg) IBW/kg (Calculated) : 54.7  Vital Signs: Temp: 98.8 F (37.1 C) (05/28 0429) Temp Source: Axillary (05/28 0429) BP: 118/71 mmHg (05/28 0429) Pulse Rate: 67 (05/28 0429)  Labs:  Recent Labs  10/27/14 0855  10/28/14 0450 10/28/14 2151 10/29/14 0935 10/29/14 1747 10/30/14 0515  HGB  --   --   --  9.9*  --   --   --   HCT  --   --   --  33.0*  --   --   --   PLT  --   --   --  162  --   --   --   LABPROT  --   --  16.6*  --   --  26.2* 24.3*  INR  --   --  1.33  --   --  2.43* 2.21*  CREATININE  --   < > 1.37* 1.10* 0.96  --  0.87  TROPONINI 0.04*  --   --   --   --   --   --   < > = values in this interval not displayed.  Estimated Creatinine Clearance: 49 mL/min (by C-G formula based on Cr of 0.87).   Medications:  Prescriptions prior to admission  Medication Sig Dispense Refill Last Dose  . amLODipine (NORVASC) 5 MG tablet Take 5 mg by mouth daily with breakfast.    10/26/2014 at Unknown time  . Asenapine Maleate 10 MG SUBL Place 10 mg under the tongue 2 (two) times daily.   10/26/2014 at 0900  . Dietary Management Product (ENLYTE PO) Take 1 tablet by mouth daily.   10/26/2014 at Unknown time  . ferrous sulfate 325 (65 FE) MG tablet Take 325 mg by mouth daily with breakfast.   10/26/2014 at Unknown time  . L-Methylfolate-B12-B6-B2 (CEREFOLIN PO) Take 1 tablet by mouth daily.   10/26/2014 at Unknown time  . levothyroxine (SYNTHROID, LEVOTHROID) 175 MCG tablet Take 175 mcg by mouth daily before breakfast.   10/26/2014 at am  . lithium carbonate 300 MG capsule Take 300 mg by mouth 2 (two) times  daily.   10/26/2014 at 0800  . memantine (NAMENDA) 5 MG tablet Take 5 mg by mouth 2 (two) times daily.   10/26/2014 at Unknown time  . warfarin (COUMADIN) 1 MG tablet Take 3.5 mg by mouth daily. 1700   10/25/2014 at Unknown time  . acetaminophen (TYLENOL) 160 MG/5ML solution Take 20.3 mLs (650 mg total) by mouth every 6 (six) hours as needed for mild pain, headache or fever. (Patient not taking: Reported on 07/26/2014) 120 mL 0   . albuterol (PROVENTIL) (2.5 MG/3ML) 0.083% nebulizer solution Take 3 mLs (2.5 mg total) by nebulization every 4 (four) hours as needed for wheezing. (Patient not taking: Reported on 09/22/2014) 75 mL 12 Unknown  . aspirin 81 MG chewable tablet Chew 1 tablet (81 mg total) by mouth daily. (Patient not taking: Reported on 09/22/2014) 30 tablet 0 07/25/2014 at Unknown time  . feeding supplement, RESOURCE BREEZE, (RESOURCE BREEZE) LIQD Take 1 Container by mouth 3 (three) times daily between meals. (Patient not taking: Reported on 07/26/2014) 1 Container  0   . furosemide (LASIX) 20 MG tablet Take 1 tablet (20 mg total) by mouth daily. (Patient not taking: Reported on 09/22/2014) 30 tablet 0   . lamoTRIgine (LAMICTAL) 25 MG tablet Take 2 tablets (50 mg total) by mouth 2 (two) times daily. (Patient not taking: Reported on 09/22/2014) 120 tablet 0   . LORazepam (ATIVAN) 0.5 MG tablet Take 1 tablet (0.5 mg total) by mouth every 6 (six) hours as needed for anxiety (and agitation). (Patient not taking: Reported on 10/26/2014) 30 tablet 0 unk  . Maltodextrin-Xanthan Gum (RESOURCE THICKENUP CLEAR) POWD Use as indicated. (Patient not taking: Reported on 07/26/2014)   unknown  . pantoprazole sodium (PROTONIX) 40 mg/20 mL PACK Take 20 mLs (40 mg total) by mouth daily. (Patient not taking: Reported on 07/26/2014) 30 each 0   . senna (SENOKOT) 8.6 MG TABS tablet Take 1 tablet (8.6 mg total) by mouth 2 (two) times daily. (Patient not taking: Reported on 09/22/2014) 120 each 0 07/25/2014 at Unknown time  .  traMADol (ULTRAM) 50 MG tablet Take 1 tablet (50 mg total) by mouth every 6 (six) hours as needed (for pain). 30 tablet 0 unk    Assessment: [age] yoF presents from NH with AMS, sepsis 2/2 possible HCAP.  To continue warfarin per pharmacy while admitted.   Baseline INR 1.02  Prior anticoagulation: warfarin 3.5 mg PO daily  Today, 10/30/2014:  CBC: none today; OK from 5/26  INR continues to be therapeutic after sharp rise yesterday from previous day and doses of 5mg , 4mg , 0mg   Major drug interactions: PTA aspirin  No bleeding issues per nursing  On Tube Feeds.   Goal of Therapy: INR 2-3  Plan: Warfarin 2.5mg  today at 6pm Daily INR   Hessie KnowsJustin M Weldon Nouri, PharmD, BCPS Pager [phone] 10/30/2014 7:46 AM

## 2014-10-31 LAB — BASIC METABOLIC PANEL
Anion gap: 5 (ref 5–15)
BUN: 16 mg/dL (ref 6–20)
CALCIUM: 9.1 mg/dL (ref 8.9–10.3)
CHLORIDE: 121 mmol/L — AB (ref 101–111)
CO2: 28 mmol/L (ref 22–32)
Creatinine, Ser: 0.87 mg/dL (ref 0.44–1.00)
Glucose, Bld: 138 mg/dL — ABNORMAL HIGH (ref 65–99)
POTASSIUM: 3.8 mmol/L (ref 3.5–5.1)
Sodium: 154 mmol/L — ABNORMAL HIGH (ref 135–145)

## 2014-10-31 LAB — GLUCOSE, CAPILLARY
GLUCOSE-CAPILLARY: 103 mg/dL — AB (ref 65–99)
GLUCOSE-CAPILLARY: 105 mg/dL — AB (ref 65–99)
GLUCOSE-CAPILLARY: 111 mg/dL — AB (ref 65–99)
GLUCOSE-CAPILLARY: 126 mg/dL — AB (ref 65–99)
Glucose-Capillary: 105 mg/dL — ABNORMAL HIGH (ref 65–99)
Glucose-Capillary: 97 mg/dL (ref 65–99)

## 2014-10-31 LAB — PROTIME-INR
INR: 1.55 — AB (ref 0.00–1.49)
PROTHROMBIN TIME: 18.6 s — AB (ref 11.6–15.2)

## 2014-10-31 MED ORDER — DEXTROSE 5 % IV SOLN
1.0000 g | Freq: Two times a day (BID) | INTRAVENOUS | Status: AC
  Administered 2014-10-31 – 2014-11-01 (×4): 1 g via INTRAVENOUS
  Filled 2014-10-31 (×4): qty 1

## 2014-10-31 MED ORDER — WARFARIN SODIUM 5 MG PO TABS
5.0000 mg | ORAL_TABLET | Freq: Once | ORAL | Status: AC
  Administered 2014-10-31: 5 mg via ORAL
  Filled 2014-10-31: qty 1

## 2014-10-31 MED ORDER — LEVOFLOXACIN 500 MG PO TABS: 500.0000 mg | ORAL_TABLET | Freq: Every day | ORAL | Status: DC

## 2014-10-31 MED ORDER — MEMANTINE HCL 10 MG PO TABS
5.0000 mg | ORAL_TABLET | Freq: Two times a day (BID) | ORAL | Status: DC
  Administered 2014-10-31 – 2014-11-03 (×7): 5 mg
  Filled 2014-10-31 (×9): qty 1

## 2014-10-31 NOTE — Progress Notes (Signed)
[facility] PROGRESS NOTE  [name] [name] ZOX:[id number] DOB: 05-31-1941 DOA: 10/26/2014 PCP: Astrid Divine, MD Interim summary: 74 y.o.old lady with h/o dementia, hypertension, comes in for altered mental status and abnormal labs.  She was found to be hypernatremic, has a LLL pneumonia and UTI. She was started on broad spectrum antibiotics . Family requested G tube placement as she has been previously admitted for similar complaints, decreased po intake and dehydration . IR consulted and she underwent G tube placement on 5/26. Few hours after the G tube placement, patient became more lethargic probably from the versad and saphris. Over night patient became more lethargic, hypotensive and hypoxic and was transferred to Step down. She underwent a CT head , which did not reveal any acute abnormality. She was briefly on BIPAP, but was later on transitioned to [place] oxygen. Overnight she received a litre fluid bolus.  On the am of 5/27,  she is alert and conversing and requesting breakfast.  Speech evaluation with MBS ORDERED.   Assessment/Plan: 1. Hypernatremia: Possibly from decreased/ none oral intake.  Started on FLUIDS and sodium has improved to 153, stop the dextrose fluids and start free water through the G TUBE. Repeat BMP is pending.  - speech consulted and recommendations given. Continue with NPO.  2. LLL pneumonia: Started on antibiotics. D/c IV vancomycin as cultures have been negative so far. Completed 6 days of IV cefepime, plan to transition to levaquin today.   Dehydration: Started on IV fluids. Improved  Hypertension: BP parameters much better this am. Off any bP meds.   Dementia: no agitation.   Acute renal failure; possibly from dehydration and decreased po intake. Much improved.  Creatinine is Close to baseline.  Failure to thrive: G TUBE placed and nutrition  consulted for initiation of tube feeds.    Leukocytosis: probably from the pneumonia. Resolved.    Proteus UTI;  Resolved. Repeat cultures are negative.    Low TSh and elevated free t4: - on 175 mcg of synthroid at the facility , decreased the dose of 150 mcg . - stopped lithium for now as it might be causing hyperthyroidism.       Code Status: full code.  Family Communication: none at bedside,. Discussed with son over the phone.  Disposition Plan: back to SNF in 1 to 2 days.    Consultants:  none  Procedures:  none  Antibiotics:  Cefepime.  5/24 to 5/29  levaquin 5/29  HPI/Subjective: No new complaints.   Objective: Filed Vitals:   10/31/14 0429  BP: 92/62  Pulse: 82  Temp: 97.7 F (36.5 C)  Resp: 16    Intake/Output Summary (Last 24 hours) at 10/31/14 0944 Last data filed at 10/31/14 0600  Gross per 24 hour  Intake    310 ml  Output   1675 ml  Net  -1365 ml   Filed Weights   10/29/14 0000 10/29/14 1900 10/31/14 0429  Weight: 61.6 kg (135 lb 12.9 oz) 62.9 kg (138 lb 10.7 oz) 63.1 kg (139 lb 1.8 oz)    Exam:   General:  Alert but confused  Cardiovascular: s1s2  Respiratory: diminished at bases. No wheezing or rhonchi.   Abdomen: soft non tender non distended bowel sounds heard, G tube in place.   Musculoskeletal: no pedal edema.   Data Reviewed: Basic Metabolic Panel:  Recent Labs Lab 10/27/14 1925 10/28/14 0450 10/28/14 2151 10/29/14 0935 10/30/14 0515  NA 159* 157* 154* 153* 153*  K 3.4* 3.4* 3.5 4.0 4.0  CL 125* 124* 122* 121* 123*  CO2 GLUCOSE 156* 130* 97 130* 146*  BUN 24* 21* CREATININE 1.39* 1.37* 1.10* 0.96 0.87  CALCIUM 9.9 9.7 9.1 9.0 9.3   Liver Function Tests:  Recent Labs Lab 10/26/14 1704 10/27/14 0245  AST 33 31  ALT 24 23  ALKPHOS 63 61  BILITOT 1.0 1.0  PROT 6.2* 5.2*  ALBUMIN 3.1* 2.8*   No results for input(s): LIPASE, AMYLASE in the last 168 hours.  Recent Labs Lab 10/29/14 0230  AMMONIA 38*   CBC:  Recent Labs Lab 10/26/14 1704 10/27/14 0245  10/28/14 2151  WBC 12.6* 12.8* 8.0  NEUTROABS 10.5*  --   --   HGB 11.9* 12.2 9.9*  HCT 41.4 41.4  39.4 33.0*  MCV 100.2* 100.5* 100.3*  PLT 243 232 162   Cardiac Enzymes:  Recent Labs Lab 10/26/14 1704 10/26/14 2115 10/27/14 0245 10/27/14 0855  TROPONINI 0.04* 0.03 0.04* 0.04*   BNP (last 3 results) No results for input(s): BNP in the last 8760 hours.  ProBNP (last 3 results) No results for input(s): PROBNP in the last 8760 hours.  CBG:  Recent Labs Lab 10/30/14 1624 10/30/14 2015 10/31/14 0007 10/31/14 0428 10/31/14 0806  GLUCAP 111* 104* 105* 105* 126*    Recent Results (from the past 240 hour(s))  Culture, blood (routine x 2)     Status: None (Preliminary result)   Collection Time: 10/26/14  5:04 PM  Result Value Ref Range Status   Specimen Description BLOOD BLOOD LEFT FOREARM  Final   Special Requests BOTTLES DRAWN AEROBIC AND ANAEROBIC 5 CC EA  Final   Culture   Final           BLOOD CULTURE RECEIVED NO GROWTH TO DATE CULTURE WILL BE HELD FOR 5 DAYS BEFORE ISSUING A FINAL NEGATIVE REPORT Performed at [facility]    Report Status PENDING  Incomplete  Culture, Urine     Status: None   Collection Time: 10/26/14  6:27 PM  Result Value Ref Range Status   Specimen Description URINE, CATHETERIZED  Final   Special Requests NONE  Final   Colony Count   Final    >=100,000 COLONIES/ML Performed at [facility]    Culture   Final    PROTEUS MIRABILIS Performed at [facility]    Report Status 10/29/2014 FINAL  Final   Organism ID, Bacteria PROTEUS MIRABILIS  Final      Susceptibility   Proteus mirabilis - MIC*    AMPICILLIN 4 SENSITIVE Sensitive     CEFAZOLIN <=4 SENSITIVE Sensitive     CEFTRIAXONE <=1 SENSITIVE Sensitive     CIPROFLOXACIN >=4 RESISTANT Resistant     GENTAMICIN >=16 RESISTANT Resistant     LEVOFLOXACIN >=8 RESISTANT Resistant     NITROFURANTOIN 128 RESISTANT Resistant     TOBRAMYCIN 8 INTERMEDIATE  Intermediate     TRIMETH/SULFA >=320 RESISTANT Resistant     PIP/TAZO <=4 SENSITIVE Sensitive     * PROTEUS MIRABILIS  MRSA PCR Screening     Status: Abnormal   Collection Time: 10/26/14  8:51 PM  Result Value Ref Range Status   MRSA by PCR POSITIVE (A) NEGATIVE Final    Comment:        The GeneXpert MRSA Assay (FDA approved for NASAL specimens only), is one component of a comprehensive MRSA colonization surveillance program. It is not intended to diagnose MRSA infection nor to  guide or monitor treatment for MRSA infections. RESULT CALLED TO, READ BACK BY AND VERIFIED WITH: J.RIMANDO,RN AT 2222 ON 10/26/14 BY W.SHEA   Culture, blood (routine x 2)     Status: None (Preliminary result)   Collection Time: 10/26/14  9:52 PM  Result Value Ref Range Status   Specimen Description BLOOD RAC  Final   Special Requests BOTTLES DRAWN AEROBIC ONLY 3CC  Final   Culture   Final           BLOOD CULTURE RECEIVED NO GROWTH TO DATE CULTURE WILL BE HELD FOR 5 DAYS BEFORE ISSUING A FINAL NEGATIVE REPORT [facility]    Report Status PENDING  Incomplete  Culture, Urine     Status: None   Collection Time: 10/28/14  9:51 PM  Result Value Ref Range Status   Specimen Description URINE, CLEAN CATCH  Final   Special Requests NONE  Final   Colony Count NO GROWTH [facility]formed at [facility]   Final   Culture NO GROWTH [facility]formed at [facility]   Final   Report Status 10/30/2014 FINAL  Final     Studies: No results found.  Scheduled Meds: . acidophilus  1 capsule Oral Daily  . amLODipine  5 mg Oral Q breakfast  . antiseptic oral rinse  7 mL Mouth Rinse q12n4p  . aspirin  81 mg Oral Daily  . B-complex with vitamin C  1 tablet Oral Q breakfast  . ceFEPime (MAXIPIME) IV  1 g Intravenous Q12H  . chlorhexidine  15 mL Mouth Rinse BID  . cholecalciferol  1,000 Units Oral Q breakfast  . feeding supplement (ENSURE ENLIVE)  237 mL Oral TID BM  . ferrous sulfate  325  mg Oral Q breakfast  . folic acid  1 mg Oral Daily  . free water  200 mL Per Tube 3 times per day  . levothyroxine  150 mcg Oral QAC breakfast  . memantine  5 mg Oral BID  . multivitamin with minerals  1 tablet Oral Daily  . mupirocin ointment  1 application Nasal BID  . pantoprazole sodium  40 mg Oral Daily  . sodium chloride  10-40 mL Intracatheter Q12H  . sodium chloride  3 mL Intravenous Q12H  . thiamine  100 mg Oral Daily  . warfarin  5 mg Oral ONCE-1800  . Warfarin - Pharmacist Dosing Inpatient   Does not apply q1800   Continuous Infusions: . feeding supplement (JEVITY 1.2 CAL) 1,000 mL (10/31/14 0523)    Principal Problem:   Hypernatremia Active Problems:   Bipolar 1 disorder   Acute kidney injury   Dehydration   Essential hypertension   Dyslipidemia   Dementia   LLL pneumonia   Malnutrition of moderate degree   Protein-calorie malnutrition, severe    Time spent: 25 minutes.     [facility] LLCKULA,Keneshia Tena  [facility] Pager [phone]. If 7PM-7AM, please contact night-coverage at [email or web address], password [email or web address] 10/31/2014, 9:44 AM  LOS: 5 days

## 2014-10-31 NOTE — Progress Notes (Signed)
ANTICOAGULATION CONSULT NOTE - Follow Up Consult  Pharmacy Consult for Warfarin Indication: Recent DVT  Allergies  Allergen Reactions  . Antihistamine Decongestant [Triprolidine-Pse] Diarrhea  . Triprolidine-Pseudoephedrine Diarrhea  . Codeine Other (See Comments) and Rash    Gi upset Gi upset     Patient Measurements: Height: 5\' 4"  (162.6 cm) Weight: 139 lb 1.8 oz (63.1 kg) IBW/kg (Calculated) : 54.7  Vital Signs: Temp: 97.7 F (36.5 C) (05/29 0429) Temp Source: Axillary (05/29 0429) BP: 92/62 mmHg (05/29 0429) Pulse Rate: 82 (05/29 0429)  Labs:  Recent Labs  10/28/14 2151 10/29/14 0935 10/29/14 1747 10/30/14 0515 10/31/14 0400  HGB 9.9*  --   --   --   --   HCT 33.0*  --   --   --   --   PLT 162  --   --   --   --   LABPROT  --   --  26.2* 24.3* 18.6*  INR  --   --  2.43* 2.21* 1.55*  CREATININE 1.10* 0.96  --  0.87  --     Estimated Creatinine Clearance: 49 mL/min (by C-G formula based on Cr of 0.87).   Medications:  Prescriptions prior to admission  Medication Sig Dispense Refill Last Dose  . amLODipine (NORVASC) 5 MG tablet Take 5 mg by mouth daily with breakfast.    10/26/2014 at Unknown time  . Asenapine Maleate 10 MG SUBL Place 10 mg under the tongue 2 (two) times daily.   10/26/2014 at 0900  . Dietary Management Product (ENLYTE PO) Take 1 tablet by mouth daily.   10/26/2014 at Unknown time  . ferrous sulfate 325 (65 FE) MG tablet Take 325 mg by mouth daily with breakfast.   10/26/2014 at Unknown time  . L-Methylfolate-B12-B6-B2 (CEREFOLIN PO) Take 1 tablet by mouth daily.   10/26/2014 at Unknown time  . levothyroxine (SYNTHROID, LEVOTHROID) 175 MCG tablet Take 175 mcg by mouth daily before breakfast.   10/26/2014 at am  . lithium carbonate 300 MG capsule Take 300 mg by mouth 2 (two) times daily.   10/26/2014 at 0800  . memantine (NAMENDA) 5 MG tablet Take 5 mg by mouth 2 (two) times daily.   10/26/2014 at Unknown time  . warfarin (COUMADIN) 1 MG tablet Take  3.5 mg by mouth daily. 1700   10/25/2014 at Unknown time  . acetaminophen (TYLENOL) 160 MG/5ML solution Take 20.3 mLs (650 mg total) by mouth every 6 (six) hours as needed for mild pain, headache or fever. (Patient not taking: Reported on 07/26/2014) 120 mL 0   . albuterol (PROVENTIL) (2.5 MG/3ML) 0.083% nebulizer solution Take 3 mLs (2.5 mg total) by nebulization every 4 (four) hours as needed for wheezing. (Patient not taking: Reported on 09/22/2014) 75 mL 12 Unknown  . aspirin 81 MG chewable tablet Chew 1 tablet (81 mg total) by mouth daily. (Patient not taking: Reported on 09/22/2014) 30 tablet 0 07/25/2014 at Unknown time  . feeding supplement, RESOURCE BREEZE, (RESOURCE BREEZE) LIQD Take 1 Container by mouth 3 (three) times daily between meals. (Patient not taking: Reported on 07/26/2014) 1 Container 0   . furosemide (LASIX) 20 MG tablet Take 1 tablet (20 mg total) by mouth daily. (Patient not taking: Reported on 09/22/2014) 30 tablet 0   . lamoTRIgine (LAMICTAL) 25 MG tablet Take 2 tablets (50 mg total) by mouth 2 (two) times daily. (Patient not taking: Reported on 09/22/2014) 120 tablet 0   . LORazepam (ATIVAN) 0.5 MG tablet Take 1  tablet (0.5 mg total) by mouth every 6 (six) hours as needed for anxiety (and agitation). (Patient not taking: Reported on 10/26/2014) 30 tablet 0 unk  . Maltodextrin-Xanthan Gum (RESOURCE THICKENUP CLEAR) POWD Use as indicated. (Patient not taking: Reported on 07/26/2014)   unknown  . pantoprazole sodium (PROTONIX) 40 mg/20 mL PACK Take 20 mLs (40 mg total) by mouth daily. (Patient not taking: Reported on 07/26/2014) 30 each 0   . senna (SENOKOT) 8.6 MG TABS tablet Take 1 tablet (8.6 mg total) by mouth 2 (two) times daily. (Patient not taking: Reported on 09/22/2014) 120 each 0 07/25/2014 at Unknown time  . traMADol (ULTRAM) 50 MG tablet Take 1 tablet (50 mg total) by mouth every 6 (six) hours as needed (for pain). 30 tablet 0 unk    Assessment: 19 yoF presents from NH with  AMS, sepsis 2/2 possible HCAP.  To continue warfarin per pharmacy while admitted.   Baseline INR 1.02  Prior anticoagulation: warfarin 3.5 mg PO daily  Today, 10/31/2014:  CBC: none today; OK from 5/26  INR dropped to subtherapeutic after sharp rise initially and has received warfarin doses of , ,  and 2.5mg   Major drug interactions: PTA aspirin  No bleeding issues per nursing  On Tube Feeds.   Goal of Therapy: INR 2-3  Plan: Increase warfarin to  today Daily INR   Hessie Knows, PharmD, BCPS Pager [phone] 10/31/2014 9:05 AM

## 2014-11-01 ENCOUNTER — Inpatient Hospital Stay (HOSPITAL_COMMUNITY): Payer: Medicare Other

## 2014-11-01 LAB — BASIC METABOLIC PANEL
ANION GAP: 4 — AB (ref 5–15)
BUN: 19 mg/dL (ref 6–20)
CALCIUM: 8.8 mg/dL — AB (ref 8.9–10.3)
CO2: 29 mmol/L (ref 22–32)
Chloride: 120 mmol/L — ABNORMAL HIGH (ref 101–111)
Creatinine, Ser: 0.92 mg/dL (ref 0.44–1.00)
GFR calc Af Amer: >60 mL/min (ref >60)
GFR, EST NON AFRICAN AMERICAN: 60 mL/min — AB (ref >60)
Glucose, Bld: 128 mg/dL — ABNORMAL HIGH (ref 65–99)
POTASSIUM: 3.4 mmol/L — AB (ref 3.5–5.1)
Sodium: 153 mmol/L — ABNORMAL HIGH (ref 135–145)

## 2014-11-01 LAB — LEGIONELLA ANTIGEN, URINE

## 2014-11-01 LAB — GLUCOSE, CAPILLARY
GLUCOSE-CAPILLARY: 124 mg/dL — AB (ref 65–99)
Glucose-Capillary: 100 mg/dL — ABNORMAL HIGH (ref 65–99)
Glucose-Capillary: 106 mg/dL — ABNORMAL HIGH (ref 65–99)
Glucose-Capillary: 106 mg/dL — ABNORMAL HIGH (ref 65–99)
Glucose-Capillary: 120 mg/dL — ABNORMAL HIGH (ref 65–99)
Glucose-Capillary: 146 mg/dL — ABNORMAL HIGH (ref 65–99)
Glucose-Capillary: 83 mg/dL (ref 65–99)

## 2014-11-01 LAB — PROTIME-INR
INR: 1.24 (ref 0.00–1.49)
Prothrombin Time: 15.8 seconds — ABNORMAL HIGH (ref 11.6–15.2)

## 2014-11-01 MED ORDER — WARFARIN SODIUM 5 MG PO TABS
5.0000 mg | ORAL_TABLET | Freq: Once | ORAL | Status: AC
  Administered 2014-11-01: 5 mg via ORAL
  Filled 2014-11-01: qty 1

## 2014-11-01 MED ORDER — DEXTROSE 5 % IV SOLN
INTRAVENOUS | Status: DC
  Administered 2014-11-01 – 2014-11-03 (×4): via INTRAVENOUS

## 2014-11-01 MED ORDER — LORAZEPAM 2 MG/ML IJ SOLN
0.5000 mg | Freq: Once | INTRAMUSCULAR | Status: AC
  Administered 2014-11-01: 0.5 mg via INTRAVENOUS
  Filled 2014-11-01: qty 1

## 2014-11-01 NOTE — Progress Notes (Signed)
[facility] IP CLINICAL IMPRESSIONS 11/01/2014  Therapy Diagnosis Moderate pharyngeal phase dysphagia;Moderate oral phase dysphagia  Clinical Impression Prior to administration of barium, SLP provided pt with oral care removing viscous white tinged adhered secretions from posterior soft palate.    Moderate oropharyngeal dysphagia with sensorimotor components without aspiration of any consistency tested.  Laryngeal penetration of liquids intermittently prior to swallow initiation/trigger due to delay and decreased sensation.  Cued cough and dry swallow effective when pt able to perform (pt performed approx 75% of opportunities).  Lingual weakness resulted in lingual pumping, delayed transiting and premature spillage of barium into pharynx. Oral residuals noted with liquids that spill into pharynx without pt awareness/sensation.  Decreased ability for pt to follow commands contributes greatly to aspiration risk.  Mild tongue base and pharyngeal residuals noted  - worse with thicker consistencies again without pt sensation.   Recommend pt be allowed free water via straw when fully alert and accepting to maximize oral hygeine and allow trials of po with SlP only of dys1/thin/nectar using straw to maximize pt's swallow recovery.      [facility] IP TREATMENT RECOMMENDATION 11/01/2014  Treatment Recommendations Therapy as outlined in treatment plan below    [facility] IP DIET RECOMMENDATION 11/01/2014  SLP Diet Recommendations NPO;Free water protocol after oral care, trials of puree/nectar/thin with SLP only  Liquid Administration via (None)  Medication Administration Via alternative means  Compensations (None)  Postural Changes and/or Swallow Maneuvers (None)     [name] Burnetamara Kalif Kattner, MS [facility] SLP [phone]

## 2014-11-01 NOTE — Progress Notes (Signed)
ANTICOAGULATION CONSULT NOTE - Follow Up Consult  Pharmacy Consult for Warfarin Indication: Recent DVT  Allergies  Allergen Reactions  . Antihistamine Decongestant [Triprolidine-Pse] Diarrhea  . Triprolidine-Pseudoephedrine Diarrhea  . Codeine Other (See Comments) and Rash    Gi upset Gi upset     Patient Measurements: Height:  (162.6 cm) Weight: 136 lb 7.4 oz (61.9 kg) IBW/kg (Calculated) : 54.7  Vital Signs: Temp: 97.5 F (36.4 C) (05/30 0424) Temp Source: Oral (05/30 0424) BP: 122/73 mmHg (05/30 0424) Pulse Rate: 81 (05/30 0424)  Labs:  Recent Labs  10/30/14 0515 10/31/14 0400 10/31/14 1000 11/01/14 0330  LABPROT 24.3* 18.6*  --  15.8*  INR 2.21* 1.55*  --  1.24  CREATININE 0.87  --  0.87 0.92    Estimated Creatinine Clearance: 46.3 mL/min (by C-G formula based on Cr of 0.92).   Medications:  Prescriptions prior to admission  Medication Sig Dispense Refill Last Dose  . amLODipine (NORVASC) 5 MG tablet Take 5 mg by mouth daily with breakfast.    10/26/2014 at Unknown time  . Asenapine Maleate 10 MG SUBL Place 10 mg under the tongue 2 (two) times daily.   10/26/2014 at 0900  . Dietary Management Product (ENLYTE PO) Take 1 tablet by mouth daily.   10/26/2014 at Unknown time  . ferrous sulfate 325 (65 FE) MG tablet Take 325 mg by mouth daily with breakfast.   10/26/2014 at Unknown time  . L-Methylfolate-B12-B6-B2 (CEREFOLIN PO) Take 1 tablet by mouth daily.   10/26/2014 at Unknown time  . levothyroxine (SYNTHROID, LEVOTHROID) 175 MCG tablet Take 175 mcg by mouth daily before breakfast.   10/26/2014 at am  . lithium carbonate 300 MG capsule Take 300 mg by mouth 2 (two) times daily.   10/26/2014 at 0800  . memantine (NAMENDA) 5 MG tablet Take 5 mg by mouth 2 (two) times daily.   10/26/2014 at Unknown time  . warfarin (COUMADIN) 1 MG tablet Take 3.5 mg by mouth daily. 1700   10/25/2014 at Unknown time  . acetaminophen (TYLENOL) 160 MG/5ML solution Take 20.3 mLs (650 mg  total) by mouth every 6 (six) hours as needed for mild pain, headache or fever. (Patient not taking: Reported on 07/26/2014) 120 mL 0   . albuterol (PROVENTIL) (2.5 MG/3ML) 0.083% nebulizer solution Take 3 mLs (2.5 mg total) by nebulization every 4 (four) hours as needed for wheezing. (Patient not taking: Reported on 09/22/2014) 75 mL 12 Unknown  . aspirin 81 MG chewable tablet Chew 1 tablet (81 mg total) by mouth daily. (Patient not taking: Reported on 09/22/2014) 30 tablet 0 07/25/2014 at Unknown time  . feeding supplement, RESOURCE BREEZE, (RESOURCE BREEZE) LIQD Take 1 Container by mouth 3 (three) times daily between meals. (Patient not taking: Reported on 07/26/2014) 1 Container 0   . furosemide (LASIX) 20 MG tablet Take 1 tablet (20 mg total) by mouth daily. (Patient not taking: Reported on 09/22/2014) 30 tablet 0   . lamoTRIgine (LAMICTAL) 25 MG tablet Take 2 tablets (50 mg total) by mouth 2 (two) times daily. (Patient not taking: Reported on 09/22/2014) 120 tablet 0   . LORazepam (ATIVAN) 0.5 MG tablet Take 1 tablet (0.5 mg total) by mouth every 6 (six) hours as needed for anxiety (and agitation). (Patient not taking: Reported on 10/26/2014) 30 tablet 0 unk  . Maltodextrin-Xanthan Gum (RESOURCE THICKENUP CLEAR) POWD Use as indicated. (Patient not taking: Reported on 07/26/2014)   unknown  . pantoprazole sodium (PROTONIX) 40 mg/20 mL PACK Take  20 mLs (40 mg total) by mouth daily. (Patient not taking: Reported on 07/26/2014) 30 each 0   . senna (SENOKOT) 8.6 MG TABS tablet Take 1 tablet (8.6 mg total) by mouth 2 (two) times daily. (Patient not taking: Reported on 09/22/2014) 120 each 0 07/25/2014 at Unknown time  . traMADol (ULTRAM) 50 MG tablet Take 1 tablet (50 mg total) by mouth every 6 (six) hours as needed (for pain). 30 tablet 0 unk    Assessment: [age] yoF presents from NH with AMS, sepsis 2/2 possible HCAP.  To continue warfarin per pharmacy while admitted.   Baseline INR 1.02  Prior  anticoagulation: warfarin 3.5 mg PO daily  Today, 11/01/2014:  CBC: none today; OK from 5/26  INR is subtherapeutic after dose held on 5/27 (d/t sharp rise in INR) and dose lowered to 2.5mg  on 5/28  Major drug interactions: broad abx  No bleeding documented  On Tube Feeds.   Goal of Therapy: INR 2-3  Plan: - repeat warfarin to 5mg  today - Daily INR - renal function is stable, continue cefepime 1gm IV q12h for now for PNA (stop date on 5/3)  Dorna LeitzAnh Krisy Dix, PharmD, BCPS 11/01/2014 10:50 AM

## 2014-11-01 NOTE — Progress Notes (Signed)
[facility] PROGRESS NOTE  [name] [name] ZOX:[id number] DOB: 09/01/1940 DOA: 10/26/2014 PCP: Astrid Divine, MD Interim summary: 74 y.o.old lady with h/o dementia, hypertension, comes in for altered mental status and abnormal labs.  She was found to be hypernatremic, has a LLL pneumonia and UTI. She was started on broad spectrum antibiotics . Family requested G tube placement as she has been previously admitted for similar complaints, decreased po intake and dehydration . IR consulted and she underwent G tube placement on 5/26. Few hours after the G tube placement, patient became more lethargic probably from the versad and saphris. Over night patient became more lethargic, hypotensive and hypoxic and was transferred to Step down. She underwent a CT head , which did not reveal any acute abnormality. She was briefly on BIPAP, but was later on transitioned to [place] oxygen.   On the am of 5/27,  she is alert and conversing and requesting breakfast.  Speech evaluation requested and recommendations given.   Assessment/Plan: 1. Hypernatremia: Possibly from decreased/ none oral intake.  Started on FLUIDS and sodium has improved to 153, tried with free water boluses through the g tube , but her sodium is still high , so we will restart dextrose fluids. Repeat BMP is am.  - speech consulted and recommendations given. Continue with NPO.  2. LLL pneumonia: Started on antibiotics. D/c IV vancomycin as cultures have been negative so far. Will have completed 7 days of IV cefepime.   Dehydration: Started on IV fluids. Improved  Hypertension: BP parameters much better this am. Off any bP meds.   Dementia: no agitation.   Acute renal failure; possibly from dehydration and decreased po intake. Much improved.  Creatinine is Close to baseline.  Failure to thrive: G TUBE placed and nutrition  consulted for initiation of tube feeds.    Leukocytosis: probably from the pneumonia. Resolved.   Proteus  UTI;  Resolved. Repeat cultures are negative.    Low TSh and elevated free t4: - on 175 mcg of synthroid at the facility , decreased the dose of 150 mcg . - stopped lithium for now as it might be causing hyperthyroidism.       Code Status: full code.  Family Communication: none at bedside,. Discussed with son over the phone on Friday..  Disposition Plan: back to SNF in 1 to 2 days.    Consultants:  none  Procedures:  none  Antibiotics:  Cefepime.  5/24 to 5/29  levaquin 5/29  HPI/Subjective: No new complaints.   Objective: Filed Vitals:   11/01/14 0424  BP: 122/73  Pulse: 81  Temp: 97.5 F (36.4 C)  Resp: 16    Intake/Output Summary (Last 24 hours) at 11/01/14 1415 Last data filed at 11/01/14 0600  Gross per 24 hour  Intake 1840.5 ml  Output   1550 ml  Net  290.5 ml   Filed Weights   10/29/14 1900 10/31/14 0429 11/01/14 0424  Weight: 62.9 kg (138 lb 10.7 oz) 63.1 kg (139 lb 1.8 oz) 61.9 kg (136 lb 7.4 oz)    Exam:   General:  Alert but confused  Cardiovascular: s1s2  Respiratory: diminished at bases. No wheezing or rhonchi.   Abdomen: soft non tender non distended bowel sounds heard, G tube in place.   Musculoskeletal: no pedal edema.   Data Reviewed: Basic Metabolic Panel:  Recent Labs Lab 10/28/14 2151 10/29/14 0935 10/30/14 0515 10/31/14 1000 11/01/14 0330  NA 154* 153* 153* 154* 153*  K 3.5 4.0 4.0 3.8 3.4*  CL 122* 121* 123* 121* 120*  CO2 27 26 25 28 29   GLUCOSE 97 130* 146* 138* 128*  BUN 18 14 13 16 19   CREATININE 1.10* 0.96 0.87 0.87 0.92  CALCIUM 9.1 9.0 9.3 9.1 8.8*   Liver Function Tests:  Recent Labs Lab 10/26/14 1704 10/27/14 0245  AST 33 31  ALT 24 23  ALKPHOS 63 61  BILITOT 1.0 1.0  PROT 6.2* 5.2*  ALBUMIN 3.1* 2.8*   No results for input(s): LIPASE, AMYLASE in the last 168 hours.  Recent Labs Lab 10/29/14 0230  AMMONIA 38*   CBC:  Recent Labs Lab 10/26/14 1704 10/27/14 0245 10/28/14 2151   WBC 12.6* 12.8* 8.0  NEUTROABS 10.5*  --   --   HGB 11.9* 12.2 9.9*  HCT 41.4 41.4  39.4 33.0*  MCV 100.2* 100.5* 100.3*  PLT 243 232 162   Cardiac Enzymes:  Recent Labs Lab 10/26/14 1704 10/26/14 2115 10/27/14 0245 10/27/14 0855  TROPONINI 0.04* 0.03 0.04* 0.04*   BNP (last 3 results) No results for input(s): BNP in the last 8760 hours.  ProBNP (last 3 results) No results for input(s): PROBNP in the last 8760 hours.  CBG:  Recent Labs Lab 10/31/14 2010 10/31/14 2348 11/01/14 0422 11/01/14 0753 11/01/14 1212  GLUCAP 103* 106* 100* 120* 106*    Recent Results (from the past 240 hour(s))  Culture, blood (routine x 2)     Status: None (Preliminary result)   Collection Time: 10/26/14  5:04 PM  Result Value Ref Range Status   Specimen Description BLOOD BLOOD LEFT FOREARM  Final   Special Requests BOTTLES DRAWN AEROBIC AND ANAEROBIC 5 CC EA  Final   Culture   Final           BLOOD CULTURE RECEIVED NO GROWTH TO DATE CULTURE WILL BE HELD FOR 5 DAYS BEFORE ISSUING A FINAL NEGATIVE REPORT Performed at [facility]    Report Status PENDING  Incomplete  Culture, Urine     Status: None   Collection Time: 10/26/14  6:27 PM  Result Value Ref Range Status   Specimen Description URINE, CATHETERIZED  Final   Special Requests NONE  Final   Colony Count   Final    >=100,000 COLONIES/ML Performed at [facility]    Culture   Final    PROTEUS MIRABILIS Performed at [facility]    Report Status 10/29/2014 FINAL  Final   Organism ID, Bacteria PROTEUS MIRABILIS  Final      Susceptibility   Proteus mirabilis - MIC*    AMPICILLIN 4 SENSITIVE Sensitive     CEFAZOLIN <=4 SENSITIVE Sensitive     CEFTRIAXONE <=1 SENSITIVE Sensitive     CIPROFLOXACIN >=4 RESISTANT Resistant     GENTAMICIN >=16 RESISTANT Resistant     LEVOFLOXACIN >=8 RESISTANT Resistant     NITROFURANTOIN 128 RESISTANT Resistant     TOBRAMYCIN 8 INTERMEDIATE Intermediate      TRIMETH/SULFA >=320 RESISTANT Resistant     PIP/TAZO <=4 SENSITIVE Sensitive     * PROTEUS MIRABILIS  MRSA PCR Screening     Status: Abnormal   Collection Time: 10/26/14  8:51 PM  Result Value Ref Range Status   MRSA by PCR POSITIVE (A) NEGATIVE Final    Comment:        The GeneXpert MRSA Assay (FDA approved for NASAL specimens only), is one component of a comprehensive MRSA colonization surveillance program. It is not intended to diagnose MRSA infection nor to  guide or monitor treatment for MRSA infections. RESULT CALLED TO, READ BACK BY AND VERIFIED WITH: J.RIMANDO,RN AT 2222 ON 10/26/14 BY W.SHEA   Culture, blood (routine x 2)     Status: None (Preliminary result)   Collection Time: 10/26/14  9:52 PM  Result Value Ref Range Status   Specimen Description BLOOD RAC  Final   Special Requests BOTTLES DRAWN AEROBIC ONLY 3CC  Final   Culture   Final           BLOOD CULTURE RECEIVED NO GROWTH TO DATE CULTURE WILL BE HELD FOR 5 DAYS BEFORE ISSUING A FINAL NEGATIVE REPORT Performed at [facility]    Report Status PENDING  Incomplete  Culture, Urine     Status: None   Collection Time: 10/28/14  9:51 PM  Result Value Ref Range Status   Specimen Description URINE, CLEAN CATCH  Final   Special Requests NONE  Final   Colony Count NO GROWTH Performed at [facility]   Final   Culture NO GROWTH Performed at [facility]   Final   Report Status 10/30/2014 FINAL  Final     Studies: Dg Swallowing Func-speech Pathology  11/01/2014    Objective Swallowing Evaluation:    Patient Details  Name: [name] [name] MRN: [id number] Date of Birth: 11/09/1940  Today's Date: 11/01/2014 Time: SLP Start Time (ACUTE ONLY): 1052-SLP Stop Time (ACUTE ONLY): 1131 SLP Time Calculation (min) (ACUTE ONLY): 39 min  Past Medical History:  Past Medical History  Diagnosis Date  . Hypertension   . Bipolar 1 disorder   . Hypothyroidism   . Memory difficulties   . HLD (hyperlipidemia)   .  Diverticulitis large intestine   . Pneumonia    Past Surgical History:  Past Surgical History  Procedure Laterality Date  . No past surgeries     HPI:  Other Pertinent Information: 74 yo female adm to [facility] with decreased  responsiveness and hypernatremia.  Found to have left lower lobe pna.  PMH  + for dysphagia, bipolar d/o, dementia, HLD, HTN.  Last swallow evaluation  completed during February admit with trace aspiration of thin - silent-  due to premature spillage into larynx prior to swallow trigger.  Pt  reported preferring nectar liquids and pureed foods at that time due to  her dysphagia - coughing with liquids and decreased ability to masticate  solids due to dentition.  CT head negative.   With admit, pt has had PEG  tube placement for nutrition as her mental status has not allowed po  intake.    No Data Recorded  Assessment / Plan / Recommendation CHL IP CLINICAL IMPRESSIONS 11/01/2014  Therapy Diagnosis Moderate pharyngeal phase dysphagia;Moderate oral phase  dysphagia  Clinical Impression Prior to administration of barium, SLP provided pt  with oral care removing viscous white tinged adhered secretions from  posterior soft palate.    Moderate oropharyngeal dysphagia with sensorimotor components without  aspiration of any consistency tested.  Laryngeal penetration of liquids  intermittently prior to swallow initiation/trigger due to delay and  decreased sensation.  Cued cough and dry swallow effective when pt able to  perform (pt performed approx 75% of opportunities).  Lingual weakness  resulted in lingual pumping, delayed transiting and premature spillage of  barium into pharynx. Oral residuals noted with liquids that spill into  pharynx without pt awareness/sensation.  Decreased ability for pt to  follow commands contributes greatly to aspiration risk.  Mild tongue base  and pharyngeal residuals  noted  - worse with thicker consistencies again  without pt sensation.   Recommend pt be allowed free water  via straw when fully alert and  accepting to maximize oral hygeine and allow trials of po with SlP only of  dys1/thin/nectar using straw to maximize pt's swallow recovery.       CHL IP TREATMENT RECOMMENDATION 11/01/2014  Treatment Recommendations Therapy as outlined in treatment plan below     CH[facility] IP DIET RECOMMENDATION 11/01/2014  SLP Diet Recommendations NPO;Free water protocol after oral care  Liquid Administration via (None)  Medication Administration Via alternative means  Compensations (None)  Postural Changes and/or Swallow Maneuvers (None)     CHL IP OTHER RECOMMENDATIONS 11/01/2014  Recommended Consults (None) Oral Care Recommendations Oral care QID  Other Recommendations (None)     CHL IP FOLLOW UP RECOMMENDATIONS 10/30/2014  Follow up Recommendations Skilled Nursing facility     [facility] IP FREQUENCY AND DURATION 11/01/2014  Speech Therapy Frequency (ACUTE ONLY) min 2x/week  Treatment Duration 1 week          CHL IP REASON FOR REFERRAL 11/01/2014  Reason for Referral Objectively evaluate swallowing function     CHL IP ORAL PHASE 11/01/2014  Oral Phase Impaired      CHL IP PHARYNGEAL PHASE 11/01/2014  Pharyngeal Phase Impaired  Pharyngeal Comment pt without sensation to laryngeal penetration or mild  oropharyngeal residuals, she followed commands to dry swallow with  approximately 75% accuracy      CHL IP CERVICAL ESOPHAGEAL PHASE 11/01/2014  Cervical Esophageal Phase [facility]            Donavan Burnet, MS [facility] SLP [phone]     Scheduled Meds: . acidophilus  1 capsule Oral Daily  . antiseptic oral rinse  7 mL Mouth Rinse q12n4p  . aspirin  81 mg Oral Daily  . B-complex with vitamin C  1 tablet Oral Q breakfast  . ceFEPime (MAXIPIME) IV  1 g Intravenous Q12H  . chlorhexidine  15 mL Mouth Rinse BID  . cholecalciferol  1,000 Units Oral Q breakfast  . feeding supplement (ENSURE ENLIVE)  237 mL Oral TID BM  . ferrous sulfate  325 mg Oral Q breakfast  . folic acid  1 mg Oral Daily  . free water  200 mL Per Tube 3  times per day  . levothyroxine  150 mcg Oral QAC breakfast  . memantine  5 mg Per Tube BID  . multivitamin with minerals  1 tablet Oral Daily  . pantoprazole sodium  40 mg Oral Daily  . sodium chloride  10-40 mL Intracatheter Q12H  . sodium chloride  3 mL Intravenous Q12H  . thiamine  100 mg Oral Daily  . warfarin  5 mg Oral ONCE-1800  . Warfarin - Pharmacist Dosing Inpatient   Does not apply q1800   Continuous Infusions: . feeding supplement (JEVITY 1.2 CAL) 1,000 mL (10/31/14 0523)    Principal Problem:   Hypernatremia Active Problems:   Bipolar 1 disorder   Acute kidney injury   Dehydration   Essential hypertension   Dyslipidemia   Dementia   LLL pneumonia   Malnutrition of moderate degree   Protein-calorie malnutrition, severe    Time spent: 25 minutes.     [facility]  [facility] Pager [phone]. If 7PM-7AM, please contact night-coverage at [email or web address], password [facility] 11/01/2014, 2:15 PM  LOS: 6 days

## 2014-11-02 DIAGNOSIS — R630 Anorexia: Secondary | ICD-10-CM

## 2014-11-02 DIAGNOSIS — Z515 Encounter for palliative care: Secondary | ICD-10-CM

## 2014-11-02 LAB — PROTIME-INR
INR: 1.09 (ref 0.00–1.49)
Prothrombin Time: 14.3 seconds (ref 11.6–15.2)

## 2014-11-02 LAB — GLUCOSE, CAPILLARY
GLUCOSE-CAPILLARY: 127 mg/dL — AB (ref 65–99)
GLUCOSE-CAPILLARY: 115 mg/dL — AB (ref 65–99)
Glucose-Capillary: 124 mg/dL — ABNORMAL HIGH (ref 65–99)
Glucose-Capillary: 122 mg/dL — ABNORMAL HIGH (ref 65–99)
Glucose-Capillary: 127 mg/dL — ABNORMAL HIGH (ref 65–99)

## 2014-11-02 LAB — BASIC METABOLIC PANEL
Anion gap: 6 (ref 5–15)
BUN: 21 mg/dL — AB (ref 6–20)
CALCIUM: 8.6 mg/dL — AB (ref 8.9–10.3)
CO2: 31 mmol/L (ref 22–32)
Chloride: 110 mmol/L (ref 101–111)
Creatinine, Ser: 0.81 mg/dL (ref 0.44–1.00)
GLUCOSE: 126 mg/dL — AB (ref 65–99)
Potassium: 3.5 mmol/L (ref 3.5–5.1)
Sodium: 147 mmol/L — ABNORMAL HIGH (ref 135–145)

## 2014-11-02 LAB — CULTURE, BLOOD (ROUTINE X 2)
Culture: NO GROWTH
Culture: NO GROWTH

## 2014-11-02 MED ORDER — WARFARIN SODIUM 5 MG PO TABS
5.0000 mg | ORAL_TABLET | Freq: Once | ORAL | Status: AC
  Administered 2014-11-02: 5 mg via ORAL
  Filled 2014-11-02: qty 1

## 2014-11-02 MED ORDER — FREE WATER
200.0000 mL | Freq: Four times a day (QID) | Status: DC
  Administered 2014-11-02 – 2014-11-03 (×5): 200 mL

## 2014-11-02 MED ORDER — VITAMINS A & D EX OINT
TOPICAL_OINTMENT | CUTANEOUS | Status: AC
  Administered 2014-11-02: 22:00:00
  Filled 2014-11-02: qty 5

## 2014-11-02 NOTE — Progress Notes (Signed)
Speech Language Pathology Treatment: Dysphagia  Patient Details Name: [name] [name] MRN: [id number] DOB: 03-Aug-1940 Today's Date: 11/02/2014 Time: [id number] SLP Time Calculation (min) (ACUTE ONLY): 29 min  Assessment / Plan / Recommendation Clinical Impression  Pt demonstrating much improved swallow ability today as she has improved mental status.  She accepted intake of applesauce, ice cream, nectar thick juice and thin water.  Minimal delay in swallow with lingual thrusting noted with suspected delayed pharyngeal swallow.  No indications of oropharyngeal residuals noted.  Wet voice noted x1 that cleared with cued throat clearing.   Pt indicates desire to consume po but anticipate intake to be poor.  Recommend  Dys1/nectar with strict precautions.  Also recommend free water protocol between meals for comfort/oral hygiene.  SLP to follow up briefly for tolerance and family education.      HPI Other Pertinent Information: 74 [facility] with [facility] responsiveness and hypernatremia.  Found to have left lower lobe pna.  PMH + for dysphagia, bipolar d/o, dementia, HLD, HTN.  Last swallow evaluation completed during February admit with trace aspiration of thin - silent- due to premature spillage into larynx prior to swallow trigger.  Pt reported preferring nectar liquids and pureed foods at that time due to her dysphagia - coughing with liquids and decreased ability to masticate solids due to dentition.  CT head negative.   With admit, pt has had PEG tube placement for nutrition as her mental status has not allowed po intake.     Pertinent Vitals Pain Assessment: Faces Pain Score: 2  Faces Pain Scale: Hurts a little bit Pain Location: abdomen Pain Intervention(s): Repositioned;Monitored during session  SLP Plan  Continue with current plan of care    Recommendations Diet recommendations: Dysphagia 1 (puree);Nectar-thick liquid Liquids provided via: Teaspoon;Straw Medication  Administration: Via alternative means Supervision: Full supervision/cueing for compensatory strategies Compensations: Slow rate;Small sips/bites;Follow solids with liquid;Clear throat intermittently Postural Changes and/or Swallow Maneuvers: Seated upright 90 degrees;Upright 30-60 min after meal              Oral Care Recommendations: Oral care QID Follow up Recommendations: Skilled Nursing facility Plan: Continue with current plan of care    GO     Mills KollerKimball, Talis Iwan Ann Correy Weidner, MS [facility] SLP [phone]

## 2014-11-02 NOTE — Progress Notes (Signed)
Full note to follow:  Ms. [name] arouses and is confused. She alternates between laughing and crying which is likely close to her baseline per her son. Long conversation with her son regarding her goals of care. Long discussion about natural progression of dementia also complicated by her bipolar disease. He understands the rationale and is open to comfort measures but has a difficult time being convinced as she has so many times with her mental health history declined but then improved after weeks-months. Explained options as far as aggressive care vs comfort care and hospice options. He has good understanding that her quality of life suffers but acknowledges that he "is a IT sales professionalfighter" and would want aggressive care. He does express desire for DNR but has felt pressured by SNF on this subject and fears that if she is DNR she will not get treatment for other things that he would want. We will plan to try and complete MOST form prior to discharge to ease his mind and make a good plan for her according to her family's wishes acting on her behalf. I will await call back to discuss with his sister and meet with him to complete MOST. Will follow up tomorrow.   Yong ChannelAlicia Eureka Valdes, NP Palliative Medicine Team Pager # [phone] (M-F 8a-5p) Team Phone # [phone] (Nights/Weekends)

## 2014-11-02 NOTE — Progress Notes (Signed)
Nutrition Follow-up  DOCUMENTATION CODES:  Severe malnutrition in context of chronic illness  INTERVENTION: - RD to manage TF: continue Jevity 1.2 @ 60 mL/hr which is providing 1728 kcal, 80 grams protein - RD to continue to monitor for needs  NUTRITION DIAGNOSIS:  Inadequate oral intake related to vomiting as evidenced by meal completion < 25%. -ongoing for PO but now receiving TF  GOAL:  Patient will meet greater than or equal to 90% of their needs -met with TF regimen  MONITOR:  PO intake, Supplement acceptance, Labs, Weight trends  ASSESSMENT: 74 y.o. female with dementia HTN hyperlipidemia presents with decreased responsiveness and abnormal labs.  5/27: - Consult received for TF initiation and management.  - PEG placement 5/26. - Pt is at refeeding risk. Spoke with RN- will start TF at low rate and advance very slowly.  - SLP consult pending to assess for ability to tolerate po intake.    5/31: - Pt receiving Jevity 1.2 @ 60 mL/hr with no issue. Pt also receiving free water flush 30 mL Q4h and additional 200 mL free water TID. Flushes plus free water in TF: 1942 mL - Pt meeting needs - Per MD in rounds, pt to d/c once Na at 145 mmol/L or lower  Labs reviewed; CBGs: 83-146 mg/dL, Na: 147 mmol/L, BUN elevated. No Mg or Phos documented, K WDL.  Height:  Ht Readings from Last 1 Encounters:  10/29/14 5' 4"  (1.626 m)    Weight:  Wt Readings from Last 1 Encounters:  11/02/14 141 lb 5 oz (64.1 kg)    Ideal Body Weight:  59.1 kg  Wt Readings from Last 10 Encounters:  11/02/14 141 lb 5 oz (64.1 kg)  08/06/14 133 lb 2.5 oz (60.4 kg)  06/23/14 167 lb 12.3 oz (76.1 kg)  04/26/14 171 lb 4.8 oz (77.7 kg)    BMI:  Body mass index is 24.24 kg/(m^2).  Estimated Nutritional Needs:  Kcal:  1550-1750  Protein:  75-85 g  Fluid:  1.8 L/day  Skin:  Reviewed, no issues  Diet Order:  Diet NPO time specified  EDUCATION NEEDS:  Education needs no appropriate at  this time   Intake/Output Summary (Last 24 hours) at 11/02/14 1318 Last data filed at 11/02/14 0906  Gross per 24 hour  Intake   3417 ml  Output    800 ml  Net   2617 ml    Last BM:  No BM documentation since admission    Jarome Matin, RD, LDN Inpatient Clinical Dietitian Pager # [phone] After hours/weekend pager # [phone]

## 2014-11-02 NOTE — Progress Notes (Signed)
Consultation Note Date: 11/02/2014   Patient Name: [name] [name]  DOB: [birth year]  MRN: [id number]  Age / Sex: 74 y.o., female   PCP: Maurice Small, MD Referring Physician: Kathlen Mody, MD  Reason for Consultation: Establishing goals of care  Palliative Care Assessment and Plan Summary of Established Goals of Care and Medical Treatment Preferences    Palliative Care Discussion Held Today Contacts/Participants in Discussion: Primary Decision Maker: [name]     Ms. Carias arouses and is confused. She alternates between laughing and crying which is likely close to her baseline per her son. Long conversation with her son regarding her goals of care. Long discussion about natural progression of dementia also complicated by her bipolar disease. He understands the rationale and is open to comfort measures but has a difficult time being convinced as she has so many times with her mental health history declined but then improved after weeks-months. Explained options as far as aggressive care vs comfort care and hospice options. He has good understanding that her quality of life suffers but acknowledges that he "is a IT sales professional" and would want aggressive care. He does express desire for DNR but has felt pressured by SNF on this subject and fears that if she is DNR she will not get treatment for other things that he would want. We will plan to try and complete MOST form prior to discharge to ease his mind and make a good plan for her according to her family's wishes acting on her behalf. I will await call back to discuss with his sister and meet with him to complete MOST. Will follow up tomorrow.   - Son is contemplating options and would like to give his sister opportunity to be involved - Will plan to try and complete MOST prior to discharge - Discussed hospice options  Code Status/Advance Care Planning:  FULL - will most likely decide on DNR but will f/u tomorrow  Symptom Management:    Decreased appetite: Encourage her to eat/drink for comfort. SLP following. May allow feeds/fluids via PEG.   Hypernatremia: May hydrate and give free water via PEG.   Palliative Prophylaxis: Will add colace.   Psycho-social/Spiritual:   Support System: Son is very support but says his sister has not been involved as of late. Moderate support.   Prognosis: < 6 months  Discharge Planning:  Skilled Nursing Facility for rehab with Palliative care service follow-up       Chief Complaint/History of Present Illness: 74 yo old lady with h/o dementia, hypertension, comes in for altered mental status and abnormal labs. She was found to be hypernatremic, has a LLL pneumonia and UTI. She was started on broad spectrum antibiotics . Family requested G tube placement as she has been previously admitted for similar complaints, decreased po intake and dehydration - this was placed 5/26 with complications such as lethargy, hypotension, and hypoxia post procedure that resolved 5/27. PMH reviewed.    Primary Diagnoses  Present on Admission:  . Essential hypertension . Hypernatremia . Acute kidney injury . Dyslipidemia . Dehydration . Bipolar 1 disorder  Palliative Review of Systems: Denies pain or discomfort. Unable to fully assess d/t confusion.   I have reviewed the medical record, interviewed the patient and family, and examined the patient. The following aspects are pertinent.  Past Medical History  Diagnosis Date  . Hypertension   . Bipolar 1 disorder   . Hypothyroidism   . Memory difficulties   . HLD (hyperlipidemia)   . Diverticulitis large intestine   .  Pneumonia    History   Social History  . Marital Status: Single    Spouse Name: N/A  . Number of Children: N/A  . Years of Education: N/A   Social History Main Topics  . Smoking status: Never Smoker   . Smokeless tobacco: Never Used  . Alcohol Use: No  . Drug Use: No  . Sexual Activity: No   Other Topics Concern  .  None   Social History Narrative   Family History  Problem Relation Age of Onset  . Heart disease Father    Scheduled Meds: . acidophilus  1 capsule Oral Daily  . antiseptic oral rinse  7 mL Mouth Rinse q12n4p  . aspirin  81 mg Oral Daily  . B-complex with vitamin C  1 tablet Oral Q breakfast  . chlorhexidine  15 mL Mouth Rinse BID  . cholecalciferol  1,000 Units Oral Q breakfast  . ferrous sulfate  325 mg Oral Q breakfast  . folic acid  1 mg Oral Daily  . free water  200 mL Per Tube Q6H  . levothyroxine  150 mcg Oral QAC breakfast  . memantine  5 mg Per Tube BID  . multivitamin with minerals  1 tablet Oral Daily  . pantoprazole sodium  40 mg Oral Daily  . sodium chloride  10-40 mL Intracatheter Q12H  . sodium chloride  3 mL Intravenous Q12H  . thiamine  100 mg Oral Daily  . warfarin  5 mg Oral ONCE-1800  . Warfarin - Pharmacist Dosing Inpatient   Does not apply q1800   Continuous Infusions: . dextrose 75 mL/hr at 11/02/14 1635  . feeding supplement (JEVITY 1.2 CAL) 1,000 mL (11/02/14 1224)   PRN Meds:.acetaminophen **OR** acetaminophen, albuterol, ondansetron (ZOFRAN) IV, RESOURCE THICKENUP CLEAR, sodium chloride Medications Prior to Admission:  Prior to Admission medications   Medication Sig Start Date End Date Taking? Authorizing Provider  amLODipine (NORVASC) 5 MG tablet Take 5 mg by mouth daily with breakfast.    Yes Historical Provider, MD  Asenapine Maleate 10 MG SUBL Place 10 mg under the tongue 2 (two) times daily. 10/19/14 11/18/14 Yes Historical Provider, MD  Dietary Management Product (ENLYTE PO) Take 1 tablet by mouth daily.   Yes Historical Provider, MD  ferrous sulfate 325 (65 FE) MG tablet Take 325 mg by mouth daily with breakfast.   Yes Historical Provider, MD  L-Methylfolate-B12-B6-B2 (CEREFOLIN PO) Take 1 tablet by mouth daily.   Yes Historical Provider, MD  levothyroxine (SYNTHROID, LEVOTHROID) 175 MCG tablet Take 175 mcg by mouth daily before breakfast.    Yes Historical Provider, MD  lithium carbonate 300 MG capsule Take 300 mg by mouth 2 (two) times daily. 10/19/14  Yes Historical Provider, MD  memantine (NAMENDA) 5 MG tablet Take 5 mg by mouth 2 (two) times daily.   Yes Historical Provider, MD  warfarin (COUMADIN) 1 MG tablet Take 3.5 mg by mouth daily. 1700   Yes Historical Provider, MD  acetaminophen (TYLENOL) 160 MG/5ML solution Take 20.3 mLs (650 mg total) by mouth every 6 (six) hours as needed for mild pain, headache or fever. Patient not taking: Reported on 07/26/2014 06/25/14   Alison MurrayAlma M Devine, MD  albuterol (PROVENTIL) (2.5 MG/3ML) 0.083% nebulizer solution Take 3 mLs (2.5 mg total) by nebulization every 4 (four) hours as needed for wheezing. Patient not taking: Reported on 09/22/2014 06/25/14   Alison MurrayAlma M Devine, MD  aspirin 81 MG chewable tablet Chew 1 tablet (81 mg total) by mouth daily. Patient  not taking: Reported on 09/22/2014 06/25/14   Alison Murray, MD  feeding supplement, RESOURCE BREEZE, (RESOURCE BREEZE) LIQD Take 1 Container by mouth 3 (three) times daily between meals. Patient not taking: Reported on 07/26/2014 06/25/14   Alison Murray, MD  furosemide (LASIX) 20 MG tablet Take 1 tablet (20 mg total) by mouth daily. Patient not taking: Reported on 09/22/2014 08/06/14   Rhetta Mura, MD  lamoTRIgine (LAMICTAL) 25 MG tablet Take 2 tablets (50 mg total) by mouth 2 (two) times daily. Patient not taking: Reported on 09/22/2014 08/06/14   Rhetta Mura, MD  LORazepam (ATIVAN) 0.5 MG tablet Take 1 tablet (0.5 mg total) by mouth every 6 (six) hours as needed for anxiety (and agitation). Patient not taking: Reported on 10/26/2014 08/06/14   Rhetta Mura, MD  Maltodextrin-Xanthan Gum (RESOURCE THICKENUP CLEAR) POWD Use as indicated. Patient not taking: Reported on 07/26/2014 05/03/14   Kathlen Mody, MD  pantoprazole sodium (PROTONIX) 40 mg/20 mL PACK Take 20 mLs (40 mg total) by mouth daily. Patient not taking: Reported on 07/26/2014  06/25/14   Alison Murray, MD  senna (SENOKOT) 8.6 MG TABS tablet Take 1 tablet (8.6 mg total) by mouth 2 (two) times daily. Patient not taking: Reported on 09/22/2014 05/03/14   Kathlen Mody, MD  traMADol (ULTRAM) 50 MG tablet Take 1 tablet (50 mg total) by mouth every 6 (six) hours as needed (for pain). 08/06/14   Rhetta Mura, MD   Allergies  Allergen Reactions  . Antihistamine Decongestant [Triprolidine-Pse] Diarrhea  . Triprolidine-Pseudoephedrine Diarrhea  . Codeine Other (See Comments) and Rash    Gi upset Gi upset    CBC:    Component Value Date/Time   WBC 8.0 10/28/2014 2151   HGB 9.9* 10/28/2014 2151   HCT 33.0* 10/28/2014 2151   HCT 39.4 10/27/2014 0245   PLT 162 10/28/2014 2151   MCV 100.3* 10/28/2014 2151   NEUTROABS 10.5* 10/26/2014 1704   LYMPHSABS 1.2 10/26/2014 1704   MONOABS 0.6 10/26/2014 1704   EOSABS 0.2 10/26/2014 1704   BASOSABS 0.1 10/26/2014 1704   Comprehensive Metabolic Panel:    Component Value Date/Time   NA 147* 11/02/2014 0534   K 3.5 11/02/2014 0534   CL 110 11/02/2014 0534   CO2 31 11/02/2014 0534   BUN 21* 11/02/2014 0534   CREATININE 0.81 11/02/2014 0534   GLUCOSE 126* 11/02/2014 0534   CALCIUM 8.6* 11/02/2014 0534   AST 31 10/27/2014 0245   ALT 23 10/27/2014 0245   ALKPHOS 61 10/27/2014 0245   BILITOT 1.0 10/27/2014 0245   PROT 5.2* 10/27/2014 0245   ALBUMIN 2.8* 10/27/2014 0245    Physical Exam: Vital Signs: BP 114/77 mmHg  Pulse 80  Temp(Src) 98.7 F (37.1 C) (Oral)  Resp 16  Ht 5\' 4"  (1.626 m)  Wt 64.1 kg (141 lb 5 oz)  BMI 24.24 kg/m2  SpO2 96% SpO2: SpO2: 96 % O2 Device: O2 Device: Nasal Cannula O2 Flow Rate: O2 Flow Rate (L/min): 2 L/min Intake/output summary:  Intake/Output Summary (Last 24 hours) at 11/02/14 1643 Last data filed at 11/02/14 1230  Gross per 24 hour  Intake 2635.25 ml  Output      0 ml  Net 2635.25 ml   LBM:   Baseline Weight: Weight: 58.2 kg (128 lb 4.9 oz) Most recent weight: Weight:  64.1 kg (141 lb 5 oz)  Exam Findings:  Gen: NAD, lying in bed HEENT: [place]/AT, no JVD, moist mucous membranes CV: RRR Pulm: No labored breathing  Abd: Soft, NT, ND Extremities: MAE, no edema Neuro: Easily Arousable, oriented to person only, pleasant, fluctuates between crying and laughing very quickly         Palliative Performance Scale: 30%               Additional Data Reviewed: Recent Labs     11/01/14  0330  11/02/14  0534  NA  153*  147*  BUN  19  21*  CREATININE  0.92  0.81     Time In: 1530 Time Out: 1650 Time Total:  Greater than 50%  of this time was spent counseling and coordinating care related to the above assessment and plan.  Signed by: Ulice Bold, NP  Ulice Bold, NP  11/02/2014, 4:43 PM  Please contact Palliative Medicine Team phone at [phone] for questions and concerns.

## 2014-11-02 NOTE — Progress Notes (Addendum)
ANTICOAGULATION CONSULT NOTE - Follow Up Consult  Pharmacy Consult for Warfarin Indication: Recent DVT  Allergies  Allergen Reactions  . Antihistamine Decongestant [Triprolidine-Pse] Diarrhea  . Triprolidine-Pseudoephedrine Diarrhea  . Codeine Other (See Comments) and Rash    Gi upset Gi upset     Patient Measurements: Height:  (162.6 cm) Weight: 141 lb 5 oz (64.1 kg) IBW/kg (Calculated) : 54.7  Vital Signs: Temp: 98 F (36.7 C) (05/31 0615) Temp Source: Oral (05/31 0615) BP: 94/65 mmHg (05/31 0615) Pulse Rate: 85 (05/31 0615)  Labs:  Recent Labs  10/31/14 0400 10/31/14 1000 11/01/14 0330 11/02/14 0534  LABPROT 18.6*  --  15.8* 14.3  INR 1.55*  --  1.24 1.09  CREATININE  --  0.87 0.92 0.81    Estimated Creatinine Clearance: 52.6 mL/min (by C-G formula based on Cr of 0.81).   Medications:  Prescriptions prior to admission  Medication Sig Dispense Refill Last Dose  . amLODipine (NORVASC) 5 MG tablet Take 5 mg by mouth daily with breakfast.    10/26/2014 at Unknown time  . Asenapine Maleate 10 MG SUBL Place 10 mg under the tongue 2 (two) times daily.   10/26/2014 at 0900  . Dietary Management Product (ENLYTE PO) Take 1 tablet by mouth daily.   10/26/2014 at Unknown time  . ferrous sulfate 325 (65 FE) MG tablet Take 325 mg by mouth daily with breakfast.   10/26/2014 at Unknown time  . L-Methylfolate-B12-B6-B2 (CEREFOLIN PO) Take 1 tablet by mouth daily.   10/26/2014 at Unknown time  . levothyroxine (SYNTHROID, LEVOTHROID) 175 MCG tablet Take 175 mcg by mouth daily before breakfast.   10/26/2014 at am  . lithium carbonate 300 MG capsule Take 300 mg by mouth 2 (two) times daily.   10/26/2014 at 0800  . memantine (NAMENDA) 5 MG tablet Take 5 mg by mouth 2 (two) times daily.   10/26/2014 at Unknown time  . warfarin (COUMADIN) 1 MG tablet Take 3.5 mg by mouth daily. 1700   10/25/2014 at Unknown time  . acetaminophen (TYLENOL) 160 MG/5ML solution Take 20.3 mLs (650 mg total)  by mouth every 6 (six) hours as needed for mild pain, headache or fever. (Patient not taking: Reported on 07/26/2014) 120 mL 0   . albuterol (PROVENTIL) (2.5 MG/3ML) 0.083% nebulizer solution Take 3 mLs (2.5 mg total) by nebulization every 4 (four) hours as needed for wheezing. (Patient not taking: Reported on 09/22/2014) 75 mL 12 Unknown  . aspirin 81 MG chewable tablet Chew 1 tablet (81 mg total) by mouth daily. (Patient not taking: Reported on 09/22/2014) 30 tablet 0 07/25/2014 at Unknown time  . feeding supplement, RESOURCE BREEZE, (RESOURCE BREEZE) LIQD Take 1 Container by mouth 3 (three) times daily between meals. (Patient not taking: Reported on 07/26/2014) 1 Container 0   . furosemide (LASIX) 20 MG tablet Take 1 tablet (20 mg total) by mouth daily. (Patient not taking: Reported on 09/22/2014) 30 tablet 0   . lamoTRIgine (LAMICTAL) 25 MG tablet Take 2 tablets (50 mg total) by mouth 2 (two) times daily. (Patient not taking: Reported on 09/22/2014) 120 tablet 0   . LORazepam (ATIVAN) 0.5 MG tablet Take 1 tablet (0.5 mg total) by mouth every 6 (six) hours as needed for anxiety (and agitation). (Patient not taking: Reported on 10/26/2014) 30 tablet 0 unk  . Maltodextrin-Xanthan Gum (RESOURCE THICKENUP CLEAR) POWD Use as indicated. (Patient not taking: Reported on 07/26/2014)   unknown  . pantoprazole sodium (PROTONIX) 40 mg/20 mL PACK Take  20 mLs (40 mg total) by mouth daily. (Patient not taking: Reported on 07/26/2014) 30 each 0   . senna (SENOKOT) 8.6 MG TABS tablet Take 1 tablet (8.6 mg total) by mouth 2 (two) times daily. (Patient not taking: Reported on 09/22/2014) 120 each 0 07/25/2014 at Unknown time  . traMADol (ULTRAM) 50 MG tablet Take 1 tablet (50 mg total) by mouth every 6 (six) hours as needed (for pain). 30 tablet 0 unk    Assessment: [age] yoF presents from NH with AMS, sepsis 2/2 possible HCAP.  To continue warfarin per pharmacy while admitted.   Baseline INR 1.02  Prior anticoagulation:  warfarin 3.5 mg PO daily  Today, 11/02/2014:  CBC: none today; OK from 5/26  INR now grossly subtherapeutic (held dose 5/27 and only 2.5 mg 5/28, 5 mg thereafter).  Suspect antibiotics may have contributed to spike in INR on 5/27  Major drug interactions: ASA 81; broad abx stopped 5/30  No bleeding documented  On Tube Feeds.   Goal of Therapy: INR 2-3  Plan:  Warfarin 5 mg PO again tonight but will proceed cautiously given recent INR jump.  Would revert to home dose of 3.5 mg daily as soon as INR trend reverses.  Daily INR  CBC at least q72 hr while on warfarin - will order CBC  Monitor for signs of bleeding or thrombosis  Bernadene Personrew Ineta Sinning, PharmD Pager: [phone] 11/02/2014, 10:41 AM

## 2014-11-02 NOTE — Progress Notes (Signed)
[facility] PROGRESS NOTE  [name] [name] YQI:[id number] DOB: [birth year] DOA: 10/26/2014 PCP: Astrid DivineGRIFFIN,ELAINE COLLINS, MD Interim summary: 74 y.o.old lady with h/o dementia, hypertension, comes in for altered mental status and abnormal labs.  She was found to be hypernatremic, has a LLL pneumonia and UTI. She was started on broad spectrum antibiotics . Family requested G tube placement as she has been previously admitted for similar complaints, decreased po intake and dehydration . IR consulted and she underwent G tube placement on 5/26. Few hours after the G tube placement, patient became more lethargic probably from the versad and saphris. Over night patient became more lethargic, hypotensive and hypoxic and was transferred to Step down. She underwent a CT head , which did not reveal any acute abnormality. She was briefly on BIPAP, but was later on transitioned to [place] oxygen.   On the am of 5/27,  she is alert and conversing and requesting breakfast.  Speech evaluation requested and recommendations given.   Assessment/Plan: 1. Hypernatremia: Possibly from decreased/ none oral intake.  Started on FLUIDS and sodium has improved to 147, tried with free water boluses through the g tube , had to start her on IV dextrose. . - speech consulted and recommendations given. Continue with NPO.  2. LLL pneumonia: Started on antibiotics. D/c IV vancomycin as cultures have been negative so far. Completed 7 days of IV antibiotics.   Dehydration: Started on IV fluids. Improved  Hypertension: BP parameters much better this am. Off any bP meds.   Dementia: no agitation.   Acute renal failure; possibly from dehydration and decreased po intake. Much improved.  Creatinine is Close to baseline.  Failure to thrive: G TUBE placed and nutrition  consulted for initiation of tube feeds.  Discussed with son about palliative care services, and he is open to discussion while she is still in the hospital.     Leukocytosis: probably from the pneumonia. Resolved.   Proteus UTI;  Resolved. Repeat cultures are negative.    Low TSh and elevated free t4: - on 175 mcg of synthroid at the facility , decreased the dose of 150 mcg . - stopped lithium for now as it might be causing hyperthyroidism.       Code Status: full code.  Family Communication: none at bedside,. Discussed with son over the phone 5/31 Disposition Plan: back to SNF in 1 to 2 days.    Consultants:  none  Procedures:  none  Antibiotics:  Cefepime.  5/24 to 5/29  levaquin 5/29  HPI/Subjective: No new complaints.   Objective: Filed Vitals:   11/02/14 1338  BP: 114/77  Pulse: 80  Temp: 98.7 F (37.1 C)  Resp: 16    Intake/Output Summary (Last 24 hours) at 11/02/14 1356 Last data filed at 11/02/14 0906  Gross per 24 hour  Intake   3217 ml  Output    800 ml  Net   2417 ml   Filed Weights   10/31/14 0429 11/01/14 0424 11/02/14 0636  Weight: 63.1 kg (139 lb 1.8 oz) 61.9 kg (136 lb 7.4 oz) 64.1 kg (141 lb 5 oz)    Exam:   General:  Alert but confused  Cardiovascular: s1s2  Respiratory: diminished at bases. No wheezing or rhonchi.   Abdomen: soft non tender non distended bowel sounds heard, G tube in place.   Musculoskeletal: no pedal edema.   Data Reviewed: Basic Metabolic Panel:  Recent Labs Lab 10/29/14 0935 10/30/14 0515 10/31/14 1000 11/01/14 0330 11/02/14 0534  NA 153*  153* 154* 153* 147*  K 4.0 4.0 3.8 3.4* 3.5  CL 121* 123* 121* 120* 110  CO2 GLUCOSE 130* 146* 138* 128* 126*  BUN 21*  CREATININE 0.96 0.87 0.87 0.92 0.81  CALCIUM 9.0 9.3 9.1 8.8* 8.6*   Liver Function Tests:  Recent Labs Lab 10/26/14 1704 10/27/14 0245  AST 33 31  ALT 24 23  ALKPHOS 63 61  BILITOT 1.0 1.0  PROT 6.2* 5.2*  ALBUMIN 3.1* 2.8*   No results for input(s): LIPASE, AMYLASE in the last 168 hours.  Recent Labs Lab 10/29/14 0230  AMMONIA 38*    CBC:  Recent Labs Lab 10/26/14 1704 10/27/14 0245 10/28/14 2151  WBC 12.6* 12.8* 8.0  NEUTROABS 10.5*  --   --   HGB 11.9* 12.2 9.9*  HCT 41.4 41.4  39.4 33.0*  MCV 100.2* 100.5* 100.3*  PLT 243 232 162   Cardiac Enzymes:  Recent Labs Lab 10/26/14 1704 10/26/14 2115 10/27/14 0245 10/27/14 0855  TROPONINI 0.04* 0.03 0.04* 0.04*   BNP (last 3 results) No results for input(s): BNP in the last 8760 hours.  ProBNP (last 3 results) No results for input(s): PROBNP in the last 8760 hours.  CBG:  Recent Labs Lab 11/01/14 1642 11/01/14 2017 11/01/14 2335 11/02/14 0404 11/02/14 0733  GLUCAP 83 124* 146* 127* 124*    Recent Results (from the past 240 hour(s))  Culture, blood (routine x 2)     Status: None   Collection Time: 10/26/14  5:04 PM  Result Value Ref Range Status   Specimen Description BLOOD BLOOD LEFT FOREARM  Final   Special Requests BOTTLES DRAWN AEROBIC AND ANAEROBIC 5 CC EA  Final   Culture   Final    NO GROWTH 5 DAYS Performed at [facility]    Report Status 11/02/2014 FINAL  Final  Culture, Urine     Status: None   Collection Time: 10/26/14  6:27 PM  Result Value Ref Range Status   Specimen Description URINE, CATHETERIZED  Final   Special Requests NONE  Final   Colony Count   Final    >=100,000 COLONIES/ML Performed at [facility]    Culture   Final    PROTEUS MIRABILIS Performed at [facility]    Report Status 10/29/2014 FINAL  Final   Organism ID, Bacteria PROTEUS MIRABILIS  Final      Susceptibility   Proteus mirabilis - MIC*    AMPICILLIN 4 SENSITIVE Sensitive     CEFAZOLIN <=4 SENSITIVE Sensitive     CEFTRIAXONE <=1 SENSITIVE Sensitive     CIPROFLOXACIN >=4 RESISTANT Resistant     GENTAMICIN >=16 RESISTANT Resistant     LEVOFLOXACIN >=8 RESISTANT Resistant     NITROFURANTOIN 128 RESISTANT Resistant     TOBRAMYCIN 8 INTERMEDIATE Intermediate     TRIMETH/SULFA >=320 RESISTANT Resistant     PIP/TAZO  <=4 SENSITIVE Sensitive     * PROTEUS MIRABILIS  MRSA PCR Screening     Status: Abnormal   Collection Time: 10/26/14  8:51 PM  Result Value Ref Range Status   MRSA by PCR POSITIVE (A) NEGATIVE Final    Comment:        The GeneXpert MRSA Assay (FDA approved for NASAL specimens only), is one component of a comprehensive MRSA colonization surveillance program. It is not intended to diagnose MRSA infection nor to guide or monitor treatment for MRSA infections. RESULT CALLED TO, READ BACK  BY AND VERIFIED WITH: J.RIMANDO,RN AT 2222 ON 10/26/14 BY W.SHEA   Culture, blood (routine x 2)     Status: None   Collection Time: 10/26/14  9:52 PM  Result Value Ref Range Status   Specimen Description BLOOD RAC  Final   Special Requests BOTTLES DRAWN AEROBIC ONLY 3CC  Final   Culture   Final    NO GROWTH 5 DAYS Performed at [facility]    Report Status 11/02/2014 FINAL  Final  Culture, Urine     Status: None   Collection Time: 10/28/14  9:51 PM  Result Value Ref Range Status   Specimen Description URINE, CLEAN CATCH  Final   Special Requests NONE  Final   Colony Count NO GROWTH Performed at [facility]   Final   Culture NO GROWTH Performed at [facility]   Final   Report Status 10/30/2014 FINAL  Final     Studies: Dg Swallowing Func-speech Pathology  11/01/2014    Objective Swallowing Evaluation:    Patient Details  Name: [name] [name] MRN: [id number] Date of Birth: [birth year]  Today's Date: 11/01/2014 Time: SLP Start Time (ACUTE ONLY): 1052-SLP Stop Time (ACUTE ONLY): 1131 SLP Time Calculation (min) (ACUTE ONLY): 39 min  Past Medical History:  Past Medical History  Diagnosis Date  . Hypertension   . Bipolar 1 disorder   . Hypothyroidism   . Memory difficulties   . HLD (hyperlipidemia)   . Diverticulitis large intestine   . Pneumonia    Past Surgical History:  Past Surgical History  Procedure Laterality Date  . No past surgeries     HPI:  Other Pertinent  Information: 74 yo female adm to [facility] with decreased  responsiveness and hypernatremia.  Found to have left lower lobe pna.  PMH  + for dysphagia, bipolar d/o, dementia, HLD, HTN.  Last swallow evaluation  completed during February admit with trace aspiration of thin - silent-  due to premature spillage into larynx prior to swallow trigger.  Pt  reported preferring nectar liquids and pureed foods at that time due to  her dysphagia - coughing with liquids and decreased ability to masticate  solids due to dentition.  CT head negative.   With admit, pt has had PEG  tube placement for nutrition as her mental status has not allowed po  intake.    No Data Recorded  Assessment / Plan / Recommendation CHL IP CLINICAL IMPRESSIONS 11/01/2014  Therapy Diagnosis Moderate pharyngeal phase dysphagia;Moderate oral phase  dysphagia  Clinical Impression Prior to administration of barium, SLP provided pt  with oral care removing viscous white tinged adhered secretions from  posterior soft palate.    Moderate oropharyngeal dysphagia with sensorimotor components without  aspiration of any consistency tested.  Laryngeal penetration of liquids  intermittently prior to swallow initiation/trigger due to delay and  decreased sensation.  Cued cough and dry swallow effective when pt able to  perform (pt performed approx 75% of opportunities).  Lingual weakness  resulted in lingual pumping, delayed transiting and premature spillage of  barium into pharynx. Oral residuals noted with liquids that spill into  pharynx without pt awareness/sensation.  Decreased ability for pt to  follow commands contributes greatly to aspiration risk.  Mild tongue base  and pharyngeal residuals noted  - worse with thicker consistencies again  without pt sensation.   Recommend pt be allowed free water via straw when fully alert and  accepting to maximize oral hygeine and allow trials of  po with SlP only of  dys1/thin/nectar using straw to maximize pt's swallow  recovery.       CHL IP TREATMENT RECOMMENDATION 11/01/2014  Treatment Recommendations Therapy as outlined in treatment plan below     CHL IP DIET RECOMMENDATION 11/01/2014  SLP Diet Recommendations NPO;Free water protocol after oral care  Liquid Administration via (None)  Medication Administration Via alternative means  Compensations (None)  Postural Changes and/or Swallow Maneuvers (None)     CHL IP OTHER RECOMMENDATIONS 11/01/2014  Recommended Consults (None) Oral Care Recommendations Oral care QID  Other Recommendations (None)     CHL IP FOLLOW UP RECOMMENDATIONS 10/30/2014  Follow up Recommendations Skilled Nursing facility     [facility] IP FREQUENCY AND DURATION 11/01/2014  Speech Therapy Frequency (ACUTE ONLY) min 2x/week  Treatment Duration 1 week          CHL IP REASON FOR REFERRAL 11/01/2014  Reason for Referral Objectively evaluate swallowing function     CHL IP ORAL PHASE 11/01/2014  Oral Phase Impaired      CHL IP PHARYNGEAL PHASE 11/01/2014  Pharyngeal Phase Impaired  Pharyngeal Comment pt without sensation to laryngeal penetration or mild  oropharyngeal residuals, she followed commands to dry swallow with  approximately 75% accuracy      CHL IP CERVICAL ESOPHAGEAL PHASE 11/01/2014  Cervical Esophageal Phase [facility]            Donavan Burnet, MS [facility] SLP [phone]     Scheduled Meds: . acidophilus  1 capsule Oral Daily  . antiseptic oral rinse  7 mL Mouth Rinse q12n4p  . aspirin  81 mg Oral Daily  . B-complex with vitamin C  1 tablet Oral Q breakfast  . chlorhexidine  15 mL Mouth Rinse BID  . cholecalciferol  1,000 Units Oral Q breakfast  . ferrous sulfate  325 mg Oral Q breakfast  . folic acid  1 mg Oral Daily  . free water  200 mL Per Tube 3 times per day  . levothyroxine  150 mcg Oral QAC breakfast  . memantine  5 mg Per Tube BID  . multivitamin with minerals  1 tablet Oral Daily  . pantoprazole sodium  40 mg Oral Daily  . sodium chloride  10-40 mL Intracatheter Q12H  . sodium chloride  3 mL Intravenous  Q12H  . thiamine  100 mg Oral Daily  . warfarin  5 mg Oral ONCE-1800  . Warfarin - Pharmacist Dosing Inpatient   Does not apply q1800   Continuous Infusions: . dextrose 75 mL/hr at 11/02/14 0247  . feeding supplement (JEVITY 1.2 CAL) 1,000 mL (11/02/14 1224)    Principal Problem:   Hypernatremia Active Problems:   Bipolar 1 disorder   Acute kidney injury   Dehydration   Essential hypertension   Dyslipidemia   Dementia   LLL pneumonia   Malnutrition of moderate degree   Protein-calorie malnutrition, severe    Time spent: 25 minutes.     [facility]  [facility] Pager [phone]. If 7PM-7AM, please contact night-coverage at [email or web address], password [email or web address] 11/02/2014, 1:56 PM  LOS: 7 days

## 2014-11-02 NOTE — Care Management Note (Signed)
Case Management Note  Patient Details  Name: [name] [name] MRN: [id number] Date of Birth: 06/21/1940  Subjective/Objective:                  Hypernatremia Action/Plan: Discharge planning  Expected Discharge Date: 6/1-2/16               Expected Discharge Plan:  Skilled Nursing Facility  In-House Referral:     Discharge planning Services  CM Consult  Post Acute Care Choice:    Choice offered to:     DME Arranged:    DME Agency:     HH Arranged:    HH Agency:     Status of Service:  Completed, signed off  Medicare Important Message Given:  Yes Date Medicare IM Given:  11/02/14 Medicare IM give by:  Freddy JakschSarah Jarriel Papillion [phone] Date Additional Medicare IM Given:    Additional Medicare Important Message give by:     If discussed at Long Length of Stay Meetings, dates discussed:    Additional Comments: CM notes CSW is monitoring for [name].  No other CM needs were communicated.   Yves DillJeffries, Walta [place] Christine, RN 11/02/2014, 12:41 PM

## 2014-11-03 DIAGNOSIS — R630 Anorexia: Secondary | ICD-10-CM

## 2014-11-03 LAB — BASIC METABOLIC PANEL
Anion gap: 5 (ref 5–15)
BUN: 19 mg/dL (ref 6–20)
CHLORIDE: 108 mmol/L (ref 101–111)
CO2: 32 mmol/L (ref 22–32)
Calcium: 8.7 mg/dL — ABNORMAL LOW (ref 8.9–10.3)
Creatinine, Ser: 0.67 mg/dL (ref 0.44–1.00)
GFR calc Af Amer: >60 mL/min (ref >60)
GFR calc non Af Amer: >60 mL/min (ref >60)
Glucose, Bld: 128 mg/dL — ABNORMAL HIGH (ref 65–99)
Potassium: 3.7 mmol/L (ref 3.5–5.1)
Sodium: 145 mmol/L (ref 135–145)

## 2014-11-03 LAB — GLUCOSE, CAPILLARY
GLUCOSE-CAPILLARY: 113 mg/dL — AB (ref 65–99)
GLUCOSE-CAPILLARY: 112 mg/dL — AB (ref 65–99)
Glucose-Capillary: 105 mg/dL — ABNORMAL HIGH (ref 65–99)
Glucose-Capillary: 141 mg/dL — ABNORMAL HIGH (ref 65–99)

## 2014-11-03 LAB — CBC
HEMATOCRIT: 30.7 % — AB (ref 36.0–46.0)
Hemoglobin: 9.4 g/dL — ABNORMAL LOW (ref 12.0–15.0)
MCH: 29.5 pg (ref 26.0–34.0)
MCHC: 30.6 g/dL (ref 30.0–36.0)
MCV: 96.2 fL (ref 78.0–100.0)
Platelets: 134 10*3/uL — ABNORMAL LOW (ref 150–400)
RBC: 3.19 MIL/uL — ABNORMAL LOW (ref 3.87–5.11)
RDW: 16 % — ABNORMAL HIGH (ref 11.5–15.5)
WBC: 4.9 10*3/uL (ref 4.0–10.5)

## 2014-11-03 LAB — PROTIME-INR
INR: 1.08 (ref 0.00–1.49)
Prothrombin Time: 14.2 seconds (ref 11.6–15.2)

## 2014-11-03 MED ORDER — VITAMIN D 1000 UNITS PO TABS: 1000.0000 [IU] | ORAL_TABLET | Freq: Every day | ORAL | Status: AC

## 2014-11-03 MED ORDER — PANTOPRAZOLE SODIUM 40 MG PO PACK: 40.0000 mg | PACK | ORAL | Status: AC

## 2014-11-03 MED ORDER — WARFARIN SODIUM 3 MG PO TABS
3.5000 mg | ORAL_TABLET | Freq: Once | ORAL | Status: AC
  Administered 2014-11-03: 3.5 mg via ORAL
  Filled 2014-11-03: qty 1

## 2014-11-03 MED ORDER — DOCUSATE SODIUM 100 MG PO CAPS
100.0000 mg | ORAL_CAPSULE | Freq: Every day | ORAL | Status: DC
  Administered 2014-11-03: 100 mg via ORAL
  Filled 2014-11-03: qty 1

## 2014-11-03 MED ORDER — FERROUS SULFATE 325 (65 FE) MG PO TABS: 325.0000 mg | ORAL_TABLET | Freq: Every day | ORAL | Status: AC

## 2014-11-03 MED ORDER — DOCUSATE SODIUM 100 MG PO CAPS: 100.0000 mg | ORAL_CAPSULE | Freq: Every day | ORAL | Status: AC

## 2014-11-03 MED ORDER — LEVOTHYROXINE SODIUM 150 MCG PO TABS: 150.0000 ug | ORAL_TABLET | Freq: Every day | ORAL | Status: AC

## 2014-11-03 MED ORDER — ASPIRIN 81 MG PO CHEW: 81.0000 mg | CHEWABLE_TABLET | Freq: Every day | ORAL | Status: AC

## 2014-11-03 MED ORDER — ENOXAPARIN SODIUM 150 MG/ML ~~LOC~~ SOLN: 1.5000 mg/kg | SUBCUTANEOUS | Status: AC

## 2014-11-03 MED ORDER — ADULT MULTIVITAMIN W/MINERALS CH: 1.0000 | ORAL_TABLET | Freq: Every day | ORAL | Status: AC

## 2014-11-03 MED ORDER — WARFARIN SODIUM 1 MG PO TABS: 3.5000 mg | ORAL_TABLET | Freq: Every day | ORAL | Status: AC

## 2014-11-03 MED ORDER — FREE WATER: 200.0000 mL | Freq: Four times a day (QID) | Status: AC

## 2014-11-03 MED ORDER — ACETAMINOPHEN 160 MG/5ML PO SOLN: 650.0000 mg | Freq: Four times a day (QID) | ORAL | Status: AC | PRN

## 2014-11-03 MED ORDER — TRAMADOL HCL 50 MG PO TABS: 50.0000 mg | ORAL_TABLET | Freq: Four times a day (QID) | ORAL | Status: AC | PRN

## 2014-11-03 MED ORDER — JEVITY 1.2 CAL PO LIQD: 1000.0000 mL | ORAL | Status: AC

## 2014-11-03 MED ORDER — ENOXAPARIN SODIUM 100 MG/ML ~~LOC~~ SOLN
1.5000 mg/kg | SUBCUTANEOUS | Status: DC
  Administered 2014-11-03: 95 mg via SUBCUTANEOUS
  Filled 2014-11-03: qty 1

## 2014-11-03 MED ORDER — FOLIC ACID 1 MG PO TABS: 1.0000 mg | ORAL_TABLET | Freq: Every day | ORAL | Status: AC

## 2014-11-03 MED ORDER — THIAMINE HCL 100 MG PO TABS: 100.0000 mg | ORAL_TABLET | Freq: Every day | ORAL | Status: AC

## 2014-11-03 NOTE — Progress Notes (Signed)
Speech Language Pathology Treatment: Dysphagia  Patient Details Name: [name] [name] MRN: [id number] DOB: [birth year] Today's Date: 11/03/2014 Time: [id number] SLP Time Calculation (min) (ACUTE ONLY): 31 min  Assessment / Plan / Recommendation Clinical Impression  Intake poor as pt reports she is "not hungry".  Pt willing to consume few boluses of thin soda via straw, chocolate pudding, orange creme and graham cracker with peanut butter.  Pt excessively talked and laughed while attempting to "masticate" which raises concerns for aspiration with solids.  She benefited from TOTAL cues, pudding and liquid administration to facilitate oral clearance of solids.    Good tolerance of thin soda liquid via straw and pudding texture noted with timely swallow and no indications of airway compromise.  Use of straw prevents lingual thrusting which can aid oral pressure.  Suspect thin soda improved swallow reflex due to carbonation and subsequently decreases aspiration risk. Silent asp occurred on previous MBS due to delayed swallow response.  Recommend to continue trials of solids with SLP at SNF to maximize diet advancement potential for QOL, especially now that pt has PEG for nutrition.      Mood fluctuated from laughing to brief episode of crying during session and attention to task was more impaired today than yesterday.   With strict aspiration precautions, aspiration risk can be decreased to mild.  Continuing intake of puree/nectar for institutionalized feeding and allowing thin water between meals recommended.  SlP educated pt to recommendations.        HPI Other Pertinent Information: 74 [facility] responsiveness and hypernatremia.  Found to have left lower lobe pna.  PMH + for dysphagia, bipolar d/o, dementia, HLD, HTN.  Last swallow evaluation completed during February admit with trace aspiration of thin - silent- due to premature spillage into larynx prior to swallow trigger.   Pt reported preferring nectar liquids and pureed foods at that time due to her dysphagia - coughing with liquids and decreased ability to masticate solids due to dentition.  CT head negative.   With admit, pt has had PEG tube placement for nutrition as her mental status has not allowed po intake.     Pertinent Vitals Pain Assessment: No/denies pain  SLP Plan  Continue with current plan of care    Recommendations Diet recommendations: Dysphagia 1 (puree);Nectar-thick liquid (trials of solids with SLP at SNF ) Liquids provided via: Cup;Straw Medication Administration: Via alternative means (or with puree) Supervision: Full supervision/cueing for compensatory strategies Compensations: Slow rate;Small sips/bites;Follow solids with liquid;Clear throat intermittently Postural Changes and/or Swallow Maneuvers: Seated upright 90 degrees;Upright 30-60 min after meal              Oral Care Recommendations: Oral care QID Follow up Recommendations: Skilled Nursing facility Plan: Continue with current plan of care    GO     Donavan Burnetamara Eileen Croswell, MS [facility] SLP [phone]

## 2014-11-03 NOTE — Progress Notes (Signed)
CSW continuing to follow.   Per MD, pt medically ready for discharge today.   CSW spoke with pt son, Molli HazardMatthew this morning regarding discharge. Pt son expressed interest in wanting to [facility]. [facility] and facility stated that facility did not have a bed that can meet pt needs at this time. CSW updated pt son, Molli HazardMatthew via telephone and updated pt son and pt son agreeable [name].  CSW facilitated pt discharge needs including contacting facility, faxing pt discharge information, discussing with pt son via telephone, providing RN phone number to call report, and arranging [name].   Pt son appreciative of CSW support and assistance and was grateful to be able to speak with palliative care services while pt in hospital.   No further social work needs identified at this time.  CSW signing off.   Loletta SpecterSuzanna Rontrell Moquin, MSW, LCSW Clinical Social Work [phone]

## 2014-11-03 NOTE — Progress Notes (Signed)
Daily Progress Note   Patient Name: [name] [name]       Date: 11/03/2014 DOB: [birth year]  Age: 74 y.o. MRN#: [id number] Attending Physician: Kathlen Mody, MD Primary Care Physician: Astrid Divine, MD Admit Date: 10/26/2014  Reason for Consultation/Follow-up: Establishing goals of care  Subjective: [name] is more awake and alert today but still confused. I spoke with her son, Molli Hazard, and we discussed her discharge return to [facility]. He is asking about possibility of [facility] but I will have CSW speak with him. We also discussed MOST form where he has decided: DNR, give antibiotics if indicated, give IVF if indicated. He is thinking that he wants limited interventions with rehospitalization and feeding tube as a trial period (she already has PEG placed) but is not yet decided. He is also very concerned about making anything official until it is cleared with his sister. They will need palliative follow up at SNF.    Length of Stay: 8 days  Current Medications: Scheduled Meds:  . acidophilus  1 capsule Oral Daily  . antiseptic oral rinse  7 mL Mouth Rinse q12n4p  . aspirin  81 mg Oral Daily  . B-complex with vitamin C  1 tablet Oral Q breakfast  . chlorhexidine  15 mL Mouth Rinse BID  . cholecalciferol  1,000 Units Oral Q breakfast  . docusate sodium  100 mg Oral Daily  . enoxaparin (LOVENOX) injection  1.5 mg/kg Subcutaneous Q24H  . ferrous sulfate  325 mg Oral Q breakfast  . folic acid  1 mg Oral Daily  . free water  200 mL Per Tube Q6H  . levothyroxine  150 mcg Oral QAC breakfast  . memantine  5 mg Per Tube BID  . multivitamin with minerals  1 tablet Oral Daily  . pantoprazole sodium  40 mg Oral Daily  . sodium chloride  10-40 mL Intracatheter Q12H  . sodium chloride  3 mL Intravenous Q12H  . thiamine  100 mg Oral Daily  . warfarin  3.5 mg Oral Once  . Warfarin - Pharmacist Dosing Inpatient   Does not apply q1800    Continuous Infusions: . dextrose 75 mL/hr  at 11/03/14 0538  . feeding supplement (JEVITY 1.2 CAL) 1,000 mL (11/03/14 0555)    PRN Meds: acetaminophen **OR** acetaminophen, albuterol, ondansetron (ZOFRAN) IV, RESOURCE THICKENUP CLEAR, sodium chloride  Palliative Performance Scale: 30%     Vital Signs: BP 109/66 mmHg  Pulse 88  Temp(Src) 97.6 F (36.4 C) (Oral)  Resp 18  Ht  (1.626 m)  Wt 64.4 kg (141 lb 15.6 oz)  BMI 24.36 kg/m2  SpO2 100% SpO2: SpO2: 100 % O2 Device: O2 Device: Nasal Cannula O2 Flow Rate: O2 Flow Rate (L/min): 2 L/min  Intake/output summary:  Intake/Output Summary (Last 24 hours) at 11/03/14 1255 Last data filed at 11/03/14 0645  Gross per 24 hour  Intake 3871.75 ml  Output      0 ml  Net 3871.75 ml   Baseline Weight: Weight: 58.2 kg (128 lb 4.9 oz) Most recent weight: Weight: 64.4 kg (141 lb 15.6 oz)  Physical Exam: Gen: NAD, lying in bed HEENT: [place]/AT, no JVD, moist mucous membranes CV: RRR Pulm: No labored breathing Abd: Soft, NT, ND Extremities: MAE, no edema Neuro: Easily Arousable, oriented to person only, pleasant, fluctuates between crying and laughing very quickly         Additional Data Reviewed: Recent Labs     11/02/14  0534  11/03/14  0645  WBC   --   4.9  HGB   --   9.4*  PLT   --   134*  NA  147*  145  BUN  21*  19  CREATININE  0.81  0.67     Problem List:  Patient Active Problem List   Diagnosis Date Noted  . Palliative care encounter 11/02/2014  . Decreased appetite 11/02/2014  . Malnutrition of moderate degree 10/27/2014  . Protein-calorie malnutrition, severe 10/27/2014  . Dementia 10/26/2014  . LLL pneumonia 10/26/2014  . 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 ARF (acute renal failure)   . Leukocytosis   . Essential hypertension 07/26/2014  . Dyslipidemia 07/26/2014  . Hypothyroidism 07/26/2014  . SIRS (systemic inflammatory response syndrome) 06/17/2014  . Acute encephalopathy 06/16/2014  . Hypernatremia 06/16/2014  .  Dehydration 06/16/2014  . Bipolar 1 disorder 04/26/2014  . Acute kidney injury 04/26/2014     Palliative Care Assessment & Plan    Code Status:  Full code - will likely be DNR when confirmed with her daughter  Goals of Care:  Treat the treatable. Still desiring somewhat aggressive care but thinking they do not want intubation.   3. Symptom Management:  Decreased appetite: Encourage her to eat/drink for comfort. SLP following. May allow feeds/fluids via PEG.   Hypernatremia: May hydrate and give free water via PEG.   Palliative Prophylaxis: Will add colace.   4. Prognosis: < 6 months  5. Discharge Planning: Skilled Nursing Facility for rehab with Palliative care service follow-up   Thank you for allowing the Palliative Medicine Team to assist in the care of this patient.   Time In: 1020 Time Out: 1050 Total Time 30min Prolonged Time Billed  no     Greater than 50%  of this time was spent counseling and coordinating care related to the above assessment and plan.   Ulice BoldAlicia C Jewelle Whitner, NP  11/03/2014, 12:55 PM  Please contact Palliative Medicine Team phone at [phone] for questions and concerns.

## 2014-11-03 NOTE — Discharge Summary (Signed)
Physician Discharge Summary  [name] [name] ZOX:[id number] DOB: 09-21-1940 DOA: 10/26/2014  PCP: [name] Divine, MD  Admit date: 10/26/2014 Discharge date: 11/03/2014  Time spent: 30 minutes  Recommendations for Outpatient Follow-up:  1. Follow upw ith palliative care services as recommended.  2. Please follow up with PCP in one week.   Discharge Diagnoses:  Principal Problem:   Hypernatremia Active Problems:   Bipolar 1 disorder   Acute kidney injury   Dehydration   Essential hypertension   Dyslipidemia   Dementia   LLL pneumonia   Malnutrition of moderate degree   Protein-calorie malnutrition, severe   Palliative care encounter   Decreased appetite   Discharge Condition: improved to baseline  Diet recommendation: tube feeds.   Filed Weights   11/01/14 0424 11/02/14 0636 11/03/14 0523  Weight: 61.9 kg (136 lb 7.4 oz) 64.1 kg (141 lb 5 oz) 64.4 kg (141 lb 15.6 oz)    History of present illness:  74 y.o.old lady with h/o dementia, hypertension, comes in for altered mental status and abnormal labs.  She was found to be hypernatremic, has a LLL pneumonia and UTI. She was started on broad spectrum antibiotics . Family requested G tube placement as she has been previously admitted for similar complaints, decreased po intake and dehydration . IR consulted and she underwent G tube placement on 5/26. Few hours after the G tube placement, patient became more lethargic probably from the versad and saphris. Over night patient became more lethargic, hypotensive and hypoxic and was transferred to Step down. She underwent a CT head , which did not reveal any acute abnormality. She was briefly on BIPAP, but was later on transitioned to [place] oxygen. On the am of 5/27, she is alert and conversing and requesting breakfast. Speech evaluation requested and recommendations given.   Hospital Course:  1. Hypernatremia: Possibly from decreased/ none oral intake.  Started on FLUIDS and  sodium has improved to 145 , started  free water boluses through the g tube - speech consulted and recommendations given. Continue with NPO.  2. LLL pneumonia: Started on antibiotics. D/c IV vancomycin as cultures have been negative so far. Completed 7 days of IV antibiotics.   Dehydration: Started on IV fluids. Improved  Hypertension: BP parameters much better this am. Off any bP meds.   Dementia: no agitation.   Acute renal failure; possibly from dehydration and decreased po intake. Much improved. Creatinine is Close to baseline.  Failure to thrive: G TUBE placed and nutrition consulted for initiation of tube feeds.  Discussed with son about palliative care services, and he is open to discussion while she is still in the hospital.    Leukocytosis: probably from the pneumonia. Resolved.   Proteus UTI;  Resolved. Repeat cultures are negative.    Low TSh and elevated free t4: - on 175 mcg of synthroid at the facility , decreased the dose of 150 mcg . - stopped lithium for now as it might be causing hyperthyroidism.   Procedures:  G TUBE PLACEMENT BY IR.  Consultations:  Palliative care  Discharge Exam: Filed Vitals:   11/03/14 0523  BP: 109/66  Pulse: 88  Temp: 97.6 F (36.4 C)  Resp:     General: alert afebrile comfortable Cardiovascular: s1s2 Respiratory: ctab  Discharge Instructions   Discharge Instructions    Discharge instructions    Complete by:  As directed   Follow up with PCP in one week.  Please follow up with palliative care services as recommended.  Current Discharge Medication List    START taking these medications   Details  docusate sodium (COLACE) 100 MG capsule Take 1 capsule (100 mg total) by mouth daily. Qty: 10 capsule, Refills: 0    folic acid (FOLVITE) 1 MG tablet Take 1 tablet (1 mg total) by mouth daily.    Multiple Vitamin (MULTIVITAMIN WITH MINERALS) TABS tablet Take 1 tablet by mouth daily.    thiamine 100  MG tablet Place 1 tablet (100 mg total) into feeding tube daily.    Water For Irrigation, Sterile (FREE WATER) SOLN Place 200 mLs into feeding tube every 6 (six) hours.      CONTINUE these medications which have CHANGED   Details  acetaminophen (TYLENOL) 160 MG/5ML solution Place 20.3 mLs (650 mg total) into feeding tube every 6 (six) hours as needed for mild pain, headache or fever. Qty: 120 mL, Refills: 0    aspirin 81 MG chewable tablet Place 1 tablet (81 mg total) into feeding tube daily. Qty: 30 tablet, Refills: 0    cholecalciferol (VITAMIN D) 1000 UNITS tablet Take 1 tablet (1,000 Units total) by mouth daily with breakfast.    ferrous sulfate 325 (65 FE) MG tablet Take 1 tablet (325 mg total) by mouth daily with breakfast. Refills: 3    levothyroxine (SYNTHROID, LEVOTHROID) 150 MCG tablet Take 1 tablet (150 mcg total) by mouth daily before breakfast.    Nutritional Supplements (FEEDING SUPPLEMENT, JEVITY 1.2 CAL,) LIQD Place 1,000 mLs into feeding tube continuous. Refills: 0    pantoprazole sodium (PROTONIX) 40 mg/20 mL PACK Place 20 mLs (40 mg total) into feeding tube daily. Qty: 30 each, Refills: 0    traMADol (ULTRAM) 50 MG tablet Place 1 tablet (50 mg total) into feeding tube every 6 (six) hours as needed (for pain). Qty: 30 tablet, Refills: 0    warfarin (COUMADIN) 1 MG tablet Place 3.5 tablets (3.5 mg total) into feeding tube daily. 1700      CONTINUE these medications which have NOT CHANGED   Details  Dietary Management Product (ENLYTE PO) Take 1 tablet by mouth daily.    L-Methylfolate-B12-B6-B2 (CEREFOLIN PO) Take 1 tablet by mouth daily.    memantine (NAMENDA) 5 MG tablet Take 5 mg by mouth 2 (two) times daily.    albuterol (PROVENTIL) (2.5 MG/3ML) 0.083% nebulizer solution Take 3 mLs (2.5 mg total) by nebulization every 4 (four) hours as needed for wheezing. Qty: 75 mL, Refills: 12    senna (SENOKOT) 8.6 MG TABS tablet Take 1 tablet (8.6 mg total) by mouth  2 (two) times daily. Qty: 120 each, Refills: 0      STOP taking these medications     amLODipine (NORVASC) 5 MG tablet      Asenapine Maleate 10 MG SUBL      lithium carbonate 300 MG capsule      furosemide (LASIX) 20 MG tablet      lamoTRIgine (LAMICTAL) 25 MG tablet      LORazepam (ATIVAN) 0.5 MG tablet      Maltodextrin-Xanthan Gum (RESOURCE THICKENUP CLEAR) POWD      b complex vitamins capsule        Allergies  Allergen Reactions  . Antihistamine Decongestant [Triprolidine-Pse] Diarrhea  . Triprolidine-Pseudoephedrine Diarrhea  . Codeine Other (See Comments) and Rash    Gi upset Gi upset    Follow-up Information    Follow up with [name] Divine, MD. Schedule an appointment as soon as possible for a visit in 1 week.  Specialty:  Family Medicine   Contact information:   [address] [place] [address] [phone]        The results of significant diagnostics from this hospitalization (including imaging, microbiology, ancillary and laboratory) are listed below for reference.    Significant Diagnostic Studies: Ct Head Wo Contrast  10/28/2014   CLINICAL DATA:  Rapid response.  Altered mental status.  EXAM: CT HEAD WITHOUT CONTRAST  TECHNIQUE: Contiguous axial images were obtained from the base of the skull through the vertex without intravenous contrast.  COMPARISON:  10/26/2014; 09/22/2014  FINDINGS: Similar findings of atrophy with diffuse sulcal prominence. Scattered periventricular hypodensities compatible with microvascular ischemic disease. 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 Given background parenchymal abnormalities, there is no CT evidence of acute large territory infarct. No intraparenchymal or extra-axial mass or hemorrhage. Unchanged size and configuration of the ventricles and basilar cisterns. No midline shift. Intracranial  atherosclerosis. There is underpneumatization of the bilateral frontal sinuses. The remaining paranasal sinuses and mastoid air cells are normally aerated. No air-fluid levels. Regional soft tissues appear normal. No displaced calvarial fracture.  IMPRESSION: Similar findings of atrophy and microvascular ischemic disease without acute intracranial process.   Electronically Signed   By: Simonne Come M.D.   On: 10/28/2014 20:53   Ct Head Wo Contrast  10/26/2014   CLINICAL DATA:  Altered mental status with lethargy and hypernatremia  EXAM: CT HEAD WITHOUT CONTRAST  TECHNIQUE: Contiguous axial images were obtained from the base of the skull through the vertex without intravenous contrast.  COMPARISON:  September 22, 2014  FINDINGS: There is age related volume loss. There is no intracranial mass, hemorrhage, extra-axial fluid collection, or midline shift. There is slight small vessel disease adjacent to the frontal horns of the lateral ventricles. Elsewhere gray-white compartments appear normal. No acute infarct is apparent. The bony calvarium appears intact. The mastoid air cells are clear. There is debris in the left external auditory canal.  IMPRESSION: Minimal periventricular small vessel disease. No intracranial mass, hemorrhage, or acute appearing infarct. There is probable cerumen in the left external auditory canal.   Electronically Signed   By: Bretta Bang III M.D.   On: 10/26/2014 17:27   Ir Gastrostomy Tube Mod Sed  10/28/2014   CLINICAL DATA:  Altered mental status and hypernatremia.  EXAM: PERCUTANEOUS GASTROSTOMY TUBE WITH FLUOROSCOPIC GUIDANCE  Physician: Rachelle Hora. Henn, MD  FLUOROSCOPY TIME:  4 minutes and 18 seconds, 36 mGy  MEDICATIONS AND MEDICAL HISTORY: 0.5 mg Versed. 0.5 mg glucagon. The patient is already on antibiotics.  ANESTHESIA/SEDATION: Patient was continuously monitored by a radiology nurse during the procedure.  PROCEDURE: Informed consent was obtained for a percutaneous gastrostomy  tube by the patient's son. The patient was placed on the interventional table. Fluoroscopy demonstrated gas and stool in the transverse colon. An orogastric tube was placed with fluoroscopic guidance. The anterior abdomen was prepped and draped in sterile fashion. Maximal barrier sterile technique was utilized including caps, mask, sterile gowns, sterile gloves, sterile drape, hand hygiene and skin antiseptic. Stomach was inflated with air through the orogastric tube. The skin and subcutaneous tissues were anesthetized with 1% lidocaine. A 17 gauge needle was directed into the distended stomach with fluoroscopic guidance. A wire was advanced into the stomach and a T-tact was deployed. A 9-French vascular sheath was placed and the orogastric tube was snared using a Goos[place]ck snare device.  The orogastric tube and snare were pulled out of the patient's mouth. The snare device was connected to a 20-French gastrostomy tube. The snare device and gastrostomy tube were pulled through the patient's mouth and out the anterior abdominal wall. The gastrostomy tube was cut to an appropriate length. Contrast injection through gastrostomy tube confirmed placement within the stomach. Fluoroscopic images were obtained for documentation. The gastrostomy tube was flushed with normal saline.  FINDINGS: Gastrostomy tube within the stomach.  Estimated blood loss: Minimal  COMPLICATIONS: None  IMPRESSION: Successful fluoroscopic guided percutaneous gastrostomy tube placement.   Electronically Signed   By: Richarda OverlieAdam  Henn M.D.   On: 10/28/2014 14:17   Dg Chest Port 1 View  10/28/2014   CLINICAL DATA:  Altered mental status and pneumonia  EXAM: PORTABLE CHEST - 1 VIEW  COMPARISON:  10/26/2014  FINDINGS: A retrocardiac opacity is streaky and stable. Normal heart size and stable mediastinal contours. Pneumoperitoneum as expected after percutaneous gastrostomy tube earlier the same day. No edema, effusion, or pneumothorax.  IMPRESSION: 1.  Unchanged retrocardiac atelectasis or pneumonia. 2. Pneumoperitoneum, expected after percutaneous gastrostomy tube earlier today.   Electronically Signed   By: Marnee SpringJonathon  Watts M.D.   On: 10/28/2014 21:04   Dg Chest Port 1 View  10/26/2014   CLINICAL DATA:  Altered mental status, height burden a tree me of  EXAM: PORTABLE CHEST - 1 VIEW  COMPARISON:  Portable chest x-ray of August 23, 2014  FINDINGS: The lungs are adequately inflated. There are mildly increased lung markings in the retrocardiac region. The left hemidiaphragm is partially obscured. The cardiac silhouette and pulmonary vascularity are normal. The bony thorax exhibits no acute abnormality.  IMPRESSION: Findings consistent with atelectasis or pneumonia in the left lower lobe. There is no CHF.   Electronically Signed   By: David  SwazilandJordan M.D.   On: 10/26/2014 17:29   Dg Swallowing Func-speech Pathology  11/01/2014    Objective Swallowing Evaluation:    Patient Details  Name: [name] Arhoebe Kotowski MRN: [id number] Date of Birth: [birth year]  Today's Date: 11/01/2014 Time: SLP Start Time (ACUTE ONLY): 1052-SLP Stop Time (ACUTE ONLY): 1131 SLP Time Calculation (min) (ACUTE ONLY): 39 min  Past Medical History:  Past Medical History  Diagnosis Date  . Hypertension   . Bipolar 1 disorder   . Hypothyroidism   . Memory difficulties   . HLD (hyperlipidemia)   . Diverticulitis large intestine   . Pneumonia    Past Surgical History:  Past Surgical History  Procedure Laterality Date  . No past surgeries     HPI:  Other Pertinent Information: 74 yo female [facility] with decreased  responsiveness and hypernatremia.  Found to have left lower lobe pna.  PMH  + for dysphagia, bipolar d/o, dementia, HLD, HTN.  Last swallow evaluation  completed during February admit with trace aspiration of thin - silent-  due to premature spillage into larynx prior to swallow trigger.  Pt  reported preferring nectar liquids and pureed foods at that time due to  her dysphagia - coughing with  liquids and decreased ability to masticate  solids due to dentition.  CT head negative.   With admit, pt has had PEG  tube placement for nutrition as her mental status has not allowed po  intake.    No Data Recorded  Assessment / Plan / Recommendation CHL IP CLINICAL IMPRESSIONS 11/01/2014  Therapy Diagnosis Moderate pharyngeal phase dysphagia;Moderate oral phase  dysphagia  Clinical Impression Prior to administration of barium, SLP  provided pt  with oral care removing viscous white tinged adhered secretions from  posterior soft palate.    Moderate oropharyngeal dysphagia with sensorimotor components without  aspiration of any consistency tested.  Laryngeal penetration of liquids  intermittently prior to swallow initiation/trigger due to delay and  decreased sensation.  Cued cough and dry swallow effective when pt able to  perform (pt performed approx 75% of opportunities).  Lingual weakness  resulted in lingual pumping, delayed transiting and premature spillage of  barium into pharynx. Oral residuals noted with liquids that spill into  pharynx without pt awareness/sensation.  Decreased ability for pt to  follow commands contributes greatly to aspiration risk.  Mild tongue base  and pharyngeal residuals noted  - worse with thicker consistencies again  without pt sensation.   Recommend pt be allowed free water via straw when fully alert and  accepting to maximize oral hygeine and allow trials of po with SlP only of  dys1/thin/nectar using straw to maximize pt's swallow recovery.       CHL IP TREATMENT RECOMMENDATION 11/01/2014  Treatment Recommendations Therapy as outlined in treatment plan below     CHL IP DIET RECOMMENDATION 11/01/2014  SLP Diet Recommendations NPO;Free water protocol after oral care  Liquid Administration via (None)  Medication Administration Via alternative means  Compensations (None)  Postural Changes and/or Swallow Maneuvers (None)     CHL IP OTHER RECOMMENDATIONS 11/01/2014  Recommended Consults  (None) Oral Care Recommendations Oral care QID  Other Recommendations (None)     CHL IP FOLLOW UP RECOMMENDATIONS 10/30/2014  Follow up Recommendations Skilled Nursing facility     [facility] IP FREQUENCY AND DURATION 11/01/2014  Speech Therapy Frequency (ACUTE ONLY) min 2x/week  Treatment Duration 1 week          CHL IP REASON FOR REFERRAL 11/01/2014  Reason for Referral Objectively evaluate swallowing function     CHL IP ORAL PHASE 11/01/2014  Oral Phase Impaired      CHL IP PHARYNGEAL PHASE 11/01/2014  Pharyngeal Phase Impaired  Pharyngeal Comment pt without sensation to laryngeal penetration or mild  oropharyngeal residuals, she followed commands to dry swallow with  approximately 75% accuracy      CHL IP CERVICAL ESOPHAGEAL PHASE 11/01/2014  Cervical Esophageal Phase [facility]            Donavan Burnet, MS [facility] SLP [phone]     Microbiology: Recent Results (from the past 240 hour(s))  Culture, blood (routine x 2)     Status: None   Collection Time: 10/26/14  5:04 PM  Result Value Ref Range Status   Specimen Description BLOOD BLOOD LEFT FOREARM  Final   Special Requests BOTTLES DRAWN AEROBIC AND ANAEROBIC 5 CC EA  Final   Culture   Final    NO GROWTH 5 DAYS Performed at [facility]    Report Status 11/02/2014 FINAL  Final  Culture, Urine     Status: None   Collection Time: 10/26/14  6:27 PM  Result Value Ref Range Status   Specimen Description URINE, CATHETERIZED  Final   Special Requests NONE  Final   Colony Count   Final    >=100,000 COLONIES/ML Performed at [facility]    Culture   Final    PROTEUS MIRABILIS Performed at [facility]    Report Status 10/29/2014 FINAL  Final   Organism ID, Bacteria PROTEUS MIRABILIS  Final      Susceptibility   Proteus mirabilis - MIC*  AMPICILLIN 4 SENSITIVE Sensitive     CEFAZOLIN <=4 SENSITIVE Sensitive     CEFTRIAXONE <=1 SENSITIVE Sensitive     CIPROFLOXACIN >=4 RESISTANT Resistant     GENTAMICIN >=16 RESISTANT Resistant      LEVOFLOXACIN >=8 RESISTANT Resistant     NITROFURANTOIN 128 RESISTANT Resistant     TOBRAMYCIN 8 INTERMEDIATE Intermediate     TRIMETH/SULFA >=320 RESISTANT Resistant     PIP/TAZO <=4 SENSITIVE Sensitive     * PROTEUS MIRABILIS  MRSA PCR Screening     Status: Abnormal   Collection Time: 10/26/14  8:51 PM  Result Value Ref Range Status   MRSA by PCR POSITIVE (A) NEGATIVE Final    Comment:        The GeneXpert MRSA Assay (FDA approved for NASAL specimens only), is one component of a comprehensive MRSA colonization surveillance program. It is not intended to diagnose MRSA infection nor to guide or monitor treatment for MRSA infections. RESULT CALLED TO, READ BACK BY AND VERIFIED WITH: J.RIMANDO,RN AT 2222 ON 10/26/14 BY W.SHEA   Culture, blood (routine x 2)     Status: None   Collection Time: 10/26/14  9:52 PM  Result Value Ref Range Status   Specimen Description BLOOD RAC  Final   Special Requests BOTTLES DRAWN AEROBIC ONLY 3CC  Final   Culture   Final    NO GROWTH 5 DAYS Performed at [facility]    Report Status 11/02/2014 FINAL  Final  Culture, Urine     Status: None   Collection Time: 10/28/14  9:51 PM  Result Value Ref Range Status   Specimen Description URINE, CLEAN CATCH  Final   Special Requests NONE  Final   Colony Count NO GROWTH Performed at [facility]   Final   Culture NO GROWTH Performed at [facility]   Final   Report Status 10/30/2014 FINAL  Final     Labs: Basic Metabolic Panel:  Recent Labs Lab 10/30/14 0515 10/31/14 1000 11/01/14 0330 11/02/14 0534 11/03/14 0645  NA 153* 154* 153* 147* 145  K 4.0 3.8 3.4* 3.5 3.7  CL 123* 121* 120* 110 108  CO2 25 28 29 31  32  GLUCOSE 146* 138* 128* 126* 128*  BUN 13 16 19  21* 19  CREATININE 0.87 0.87 0.92 0.81 0.67  CALCIUM 9.3 9.1 8.8* 8.6* 8.7*   Liver Function Tests: No results for input(s): AST, ALT, ALKPHOS, BILITOT, PROT, ALBUMIN in the last 168 hours. No results for  input(s): LIPASE, AMYLASE in the last 168 hours.  Recent Labs Lab 10/29/14 0230  AMMONIA 38*   CBC:  Recent Labs Lab 10/28/14 2151 11/03/14 0645  WBC 8.0 4.9  HGB 9.9* 9.4*  HCT 33.0* 30.7*  MCV 100.3* 96.2  PLT 162 134*   Cardiac Enzymes:  Recent Labs Lab 10/27/14 0855  TROPONINI 0.04*   BNP: BNP (last 3 results) No results for input(s): BNP in the last 8760 hours.  ProBNP (last 3 results) No results for input(s): PROBNP in the last 8760 hours.  CBG:  Recent Labs Lab 11/02/14 1644 11/02/14 2032 11/03/14 0015 11/03/14 0408 11/03/14 0744  GLUCAP 115* 127* 105* 113* 141*       Signed:  Mar Zettler  [facility] 11/03/2014, 8:49 AM

## 2014-11-03 NOTE — Progress Notes (Addendum)
ANTICOAGULATION CONSULT NOTE - Follow Up Consult  Pharmacy Consult for Warfarin Indication: Recent DVT  Allergies  Allergen Reactions  . Antihistamine Decongestant [Triprolidine-Pse] Diarrhea  . Triprolidine-Pseudoephedrine Diarrhea  . Codeine Other (See Comments) and Rash    Gi upset Gi upset     Patient Measurements: Height:  (162.6 cm) Weight: 141 lb 15.6 oz (64.4 kg) IBW/kg (Calculated) : 54.7  Vital Signs: Temp: 97.6 F (36.4 C) (06/01 0523) Temp Source: Oral (06/01 0523) BP: 109/66 mmHg (06/01 0523) Pulse Rate: 88 (06/01 0523)  Labs:  Recent Labs  11/01/14 0330 11/02/14 0534 11/03/14 0645  HGB  --   --  9.4*  HCT  --   --  30.7*  PLT  --   --  134*  LABPROT 15.8* 14.3 14.2  INR 1.24 1.09 1.08  CREATININE 0.92 0.81 0.67    Estimated Creatinine Clearance: 53.3 mL/min (by C-G formula based on Cr of 0.67).   Medications:  Prescriptions prior to admission  Medication Sig Dispense Refill Last Dose  . amLODipine (NORVASC) 5 MG tablet Take 5 mg by mouth daily with breakfast.    10/26/2014 at Unknown time  . Asenapine Maleate 10 MG SUBL Place 10 mg under the tongue 2 (two) times daily.   10/26/2014 at 0900  . Dietary Management Product (ENLYTE PO) Take 1 tablet by mouth daily.   10/26/2014 at Unknown time  . L-Methylfolate-B12-B6-B2 (CEREFOLIN PO) Take 1 tablet by mouth daily.   10/26/2014 at Unknown time  . levothyroxine (SYNTHROID, LEVOTHROID) 175 MCG tablet Take 175 mcg by mouth daily before breakfast.   10/26/2014 at am  . lithium carbonate 300 MG capsule Take 300 mg by mouth 2 (two) times daily.   10/26/2014 at 0800  . memantine (NAMENDA) 5 MG tablet Take 5 mg by mouth 2 (two) times daily.   10/26/2014 at Unknown time  . [DISCONTINUED] ferrous sulfate 325 (65 FE) MG tablet Take 325 mg by mouth daily with breakfast.   10/26/2014 at Unknown time  . [DISCONTINUED] warfarin (COUMADIN) 1 MG tablet Take 3.5 mg by mouth daily. 1700   10/25/2014 at Unknown time  .  albuterol (PROVENTIL) (2.5 MG/3ML) 0.083% nebulizer solution Take 3 mLs (2.5 mg total) by nebulization every 4 (four) hours as needed for wheezing. (Patient not taking: Reported on 09/22/2014) 75 mL 12 Unknown  . feeding supplement, RESOURCE BREEZE, (RESOURCE BREEZE) LIQD Take 1 Container by mouth 3 (three) times daily between meals. (Patient not taking: Reported on 07/26/2014) 1 Container 0   . furosemide (LASIX) 20 MG tablet Take 1 tablet (20 mg total) by mouth daily. (Patient not taking: Reported on 09/22/2014) 30 tablet 0   . lamoTRIgine (LAMICTAL) 25 MG tablet Take 2 tablets (50 mg total) by mouth 2 (two) times daily. (Patient not taking: Reported on 09/22/2014) 120 tablet 0   . LORazepam (ATIVAN) 0.5 MG tablet Take 1 tablet (0.5 mg total) by mouth every 6 (six) hours as needed for anxiety (and agitation). (Patient not taking: Reported on 10/26/2014) 30 tablet 0 unk  . Maltodextrin-Xanthan Gum (RESOURCE THICKENUP CLEAR) POWD Use as indicated. (Patient not taking: Reported on 07/26/2014)   unknown  . senna (SENOKOT) 8.6 MG TABS tablet Take 1 tablet (8.6 mg total) by mouth 2 (two) times daily. (Patient not taking: Reported on 09/22/2014) 120 each 0 07/25/2014 at Unknown time  . [DISCONTINUED] acetaminophen (TYLENOL) 160 MG/5ML solution Take 20.3 mLs (650 mg total) by mouth every 6 (six) hours as needed for mild pain,  headache or fever. (Patient not taking: Reported on 07/26/2014) 120 mL 0   . [DISCONTINUED] aspirin 81 MG chewable tablet Chew 1 tablet (81 mg total) by mouth daily. (Patient not taking: Reported on 09/22/2014) 30 tablet 0 07/25/2014 at Unknown time  . [DISCONTINUED] pantoprazole sodium (PROTONIX) 40 mg/20 mL PACK Take 20 mLs (40 mg total) by mouth daily. (Patient not taking: Reported on 07/26/2014) 30 each 0   . [DISCONTINUED] traMADol (ULTRAM) 50 MG tablet Take 1 tablet (50 mg total) by mouth every 6 (six) hours as needed (for pain). 30 tablet 0 unk    Assessment: 89 yoF presents from NH with  AMS, sepsis 2/2 possible HCAP.  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   Baseline INR 1.02  Prior anticoagulation: warfarin 3.5 mg PO daily  Today, 11/03/2014:  CBC: none today; OK from 5/26  INR now grossly subtherapeutic (held dose 5/27 and only 2.5 mg 5/28, 5 mg thereafter).  Suspect antibiotics may have contributed to spike in INR on 5/27  Major drug interactions: ASA 81; broad abx stopped 5/30  No bleeding documented  On Tube Feeds.   To discharge today.  Discussed low INR with MD and is amenable to bridging with lovenox  CrCl 53, stable  Goal of Therapy: INR 2-3  Plan:  Discharge today  Warfarin 3.5 mg po today.  Given bridging and lack of daily INRs once discharged, recommend resuming lower PTA regimen of 3.5 mg po daily  Per MD request, will start 1st dose of Lovenox 1.5 mg/kg q24 hr prior to discharge   Recommend continuing bridge until INR >= 2 for 2 consecutive readings  Daily INR while inpatient  CBC at least q72 hr while on warfarin  Monitor for signs of bleeding or thrombosis  Bernadene Personrew Raylin Diguglielmo, PharmD Pager: [phone] 11/03/2014, 10:18 AM

## 2014-12-03 DEATH — deceased

## 2015-08-23 IMAGING — CT CT HEAD W/O CM
2 series · 17 of 30 positions shown, 20 images · non-contrast
Comparison: September 22, 2014

CLINICAL DATA: Altered mental status with lethargy and
hypernatremia

EXAM:
CT HEAD WITHOUT CONTRAST
TECHNIQUE: Contiguous axial images were obtained from the base of the skull
through the vertex without intravenous contrast.

[Series 2: head w/o · axial · non-contrast · 0.42mm/px · z∈[-95,+40]mm · 9 of 35 slices shown, 12 images]
[im 4/35  brain]
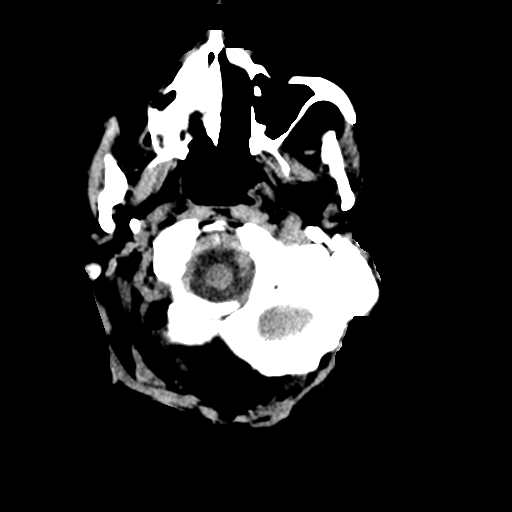
[im 4/35  bone]
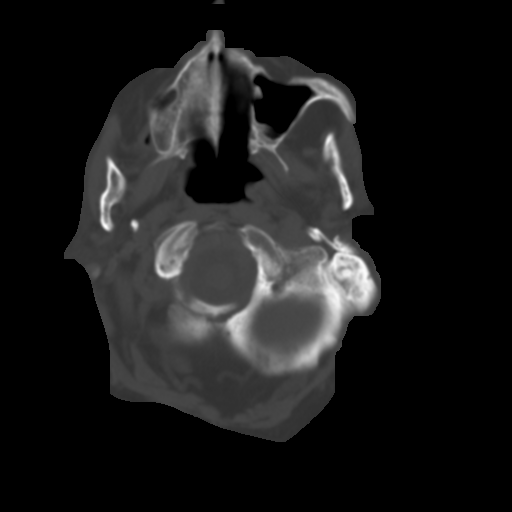
[im 7/35  brain]
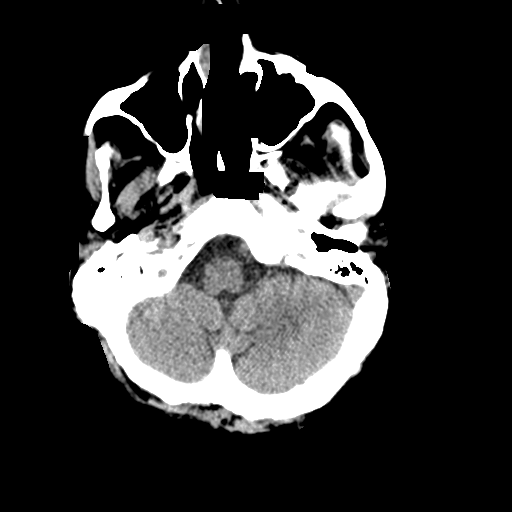
[im 11/35  brain]
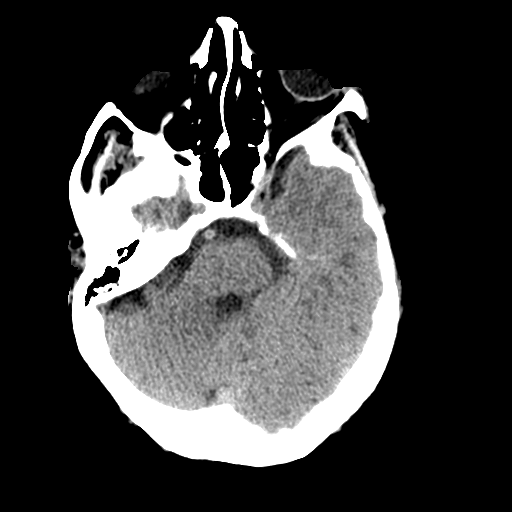
[im 14/35  brain]
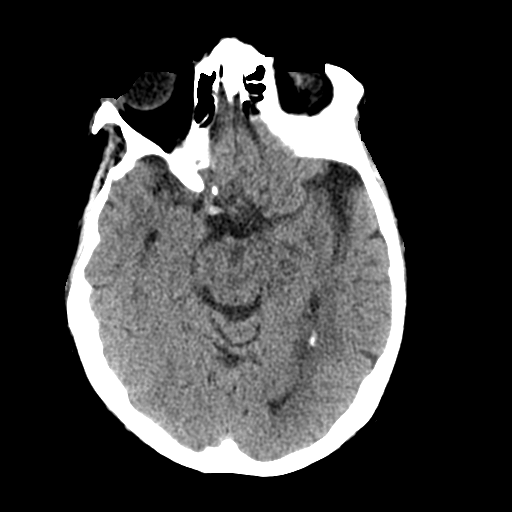
[im 18/35  brain]
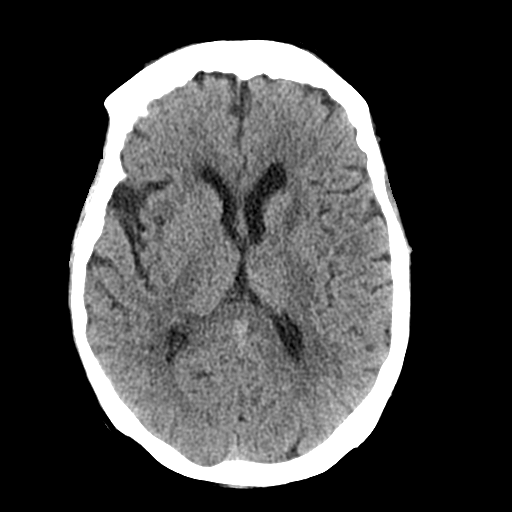
[im 18/35  bone]
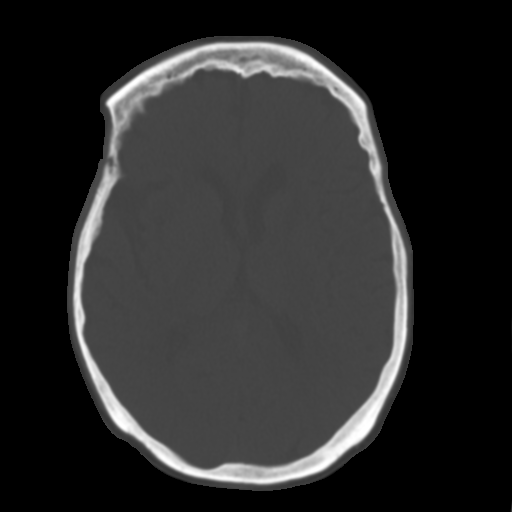
[im 21/35  brain]
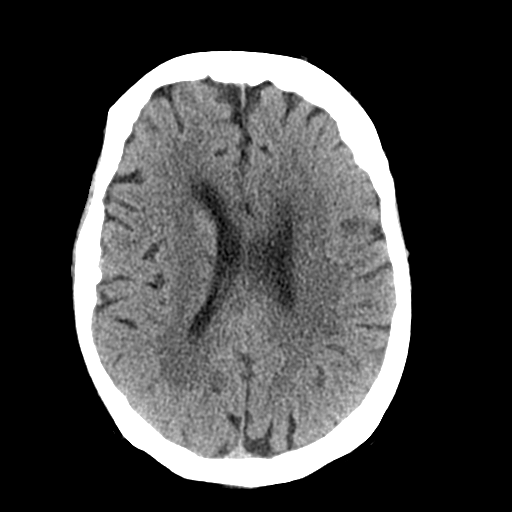
[im 24/35  brain]
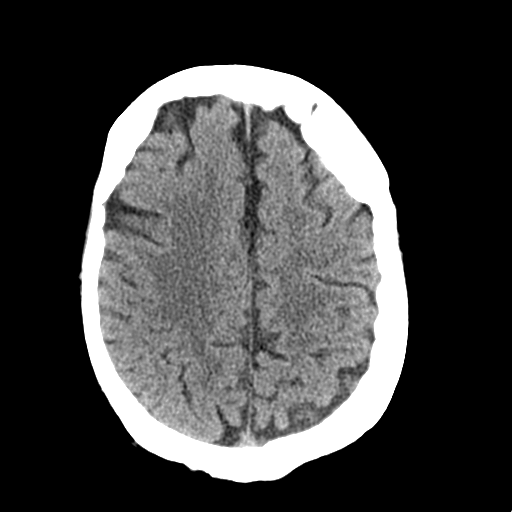
[im 28/35  brain]
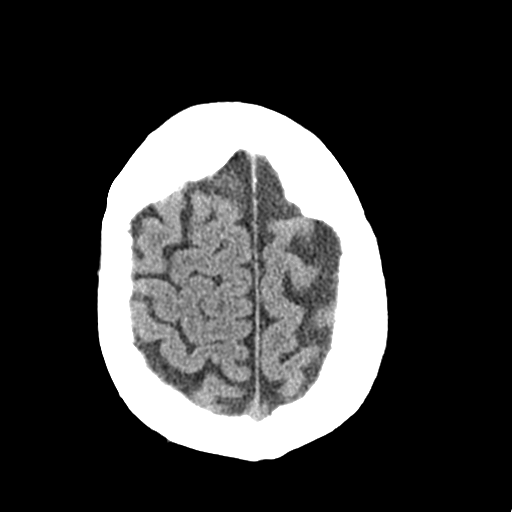
[im 31/35  brain]
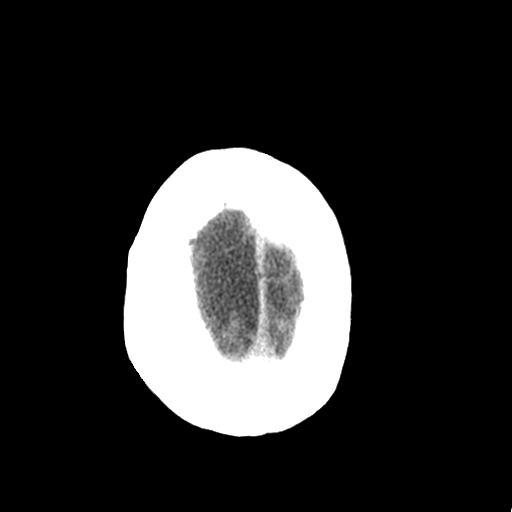
[im 31/35  bone]
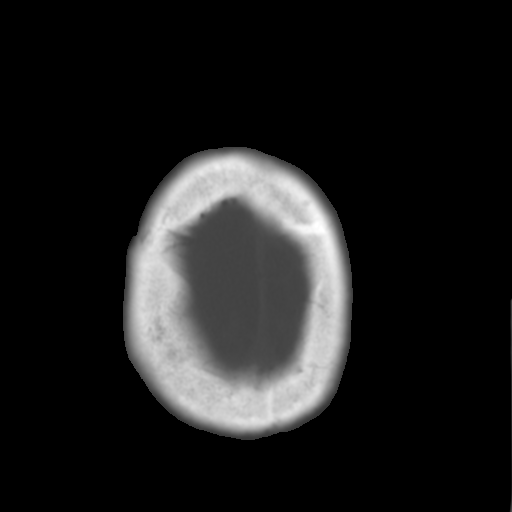

[Series 3: bone windows · axial · 0.42mm/px · z∈[-92,+40]mm · 8 of 58 slices shown]
[im 7/58  bone]
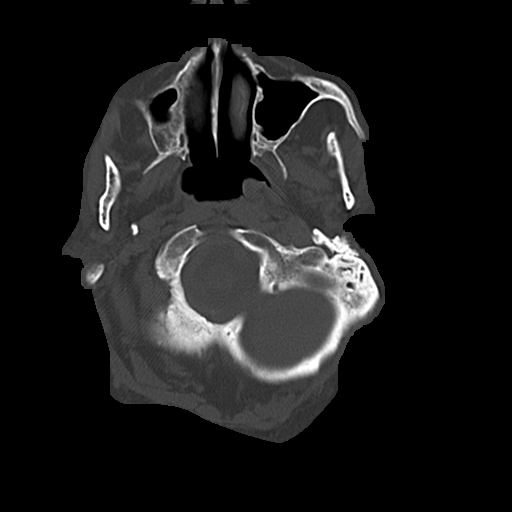
[im 13/58  bone]
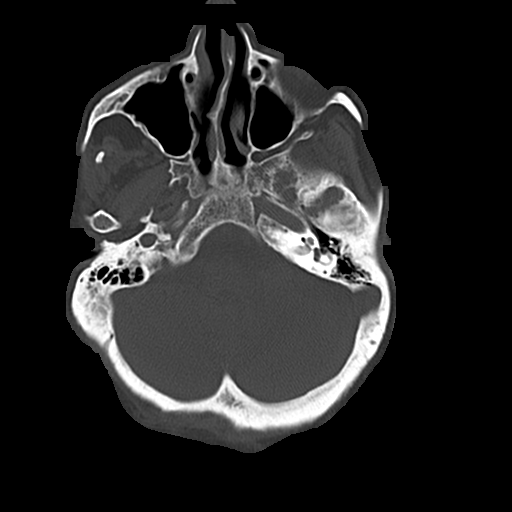
[im 20/58  bone]
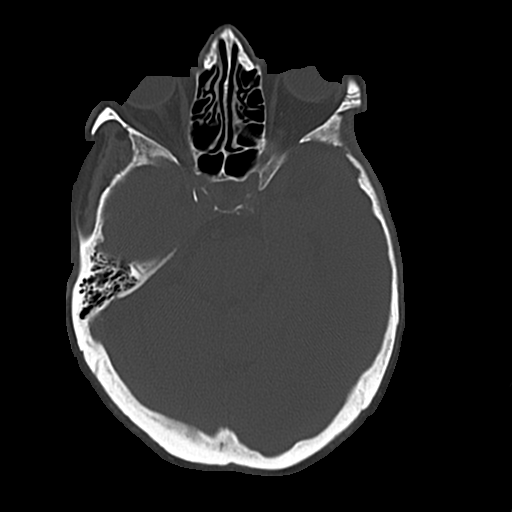
[im 26/58  bone]
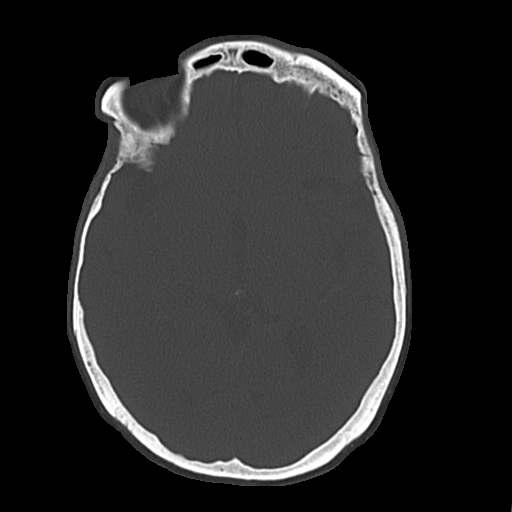
[im 32/58  bone]
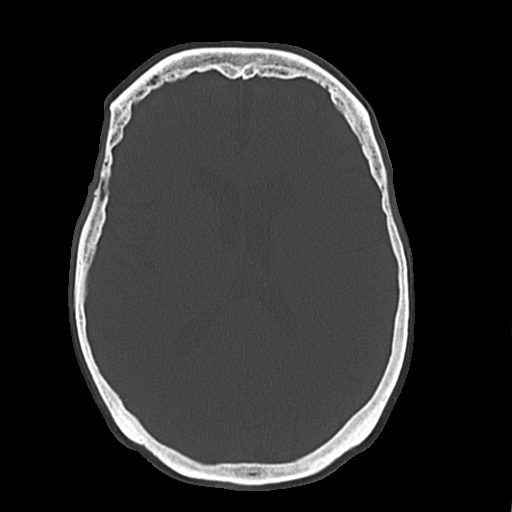
[im 39/58  bone]
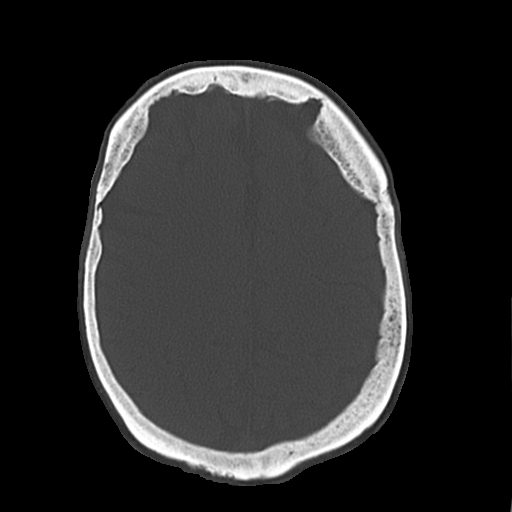
[im 45/58  bone]
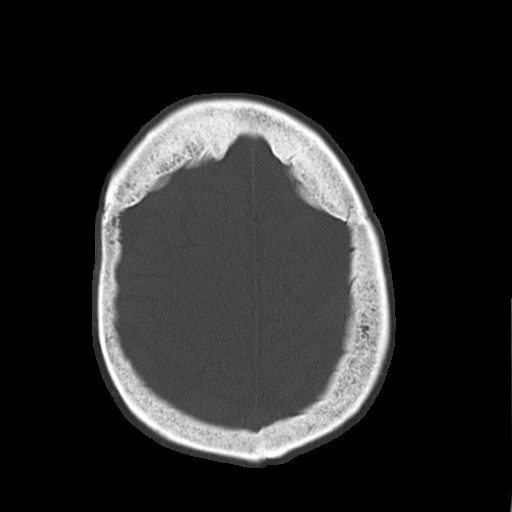
[im 51/58  bone]
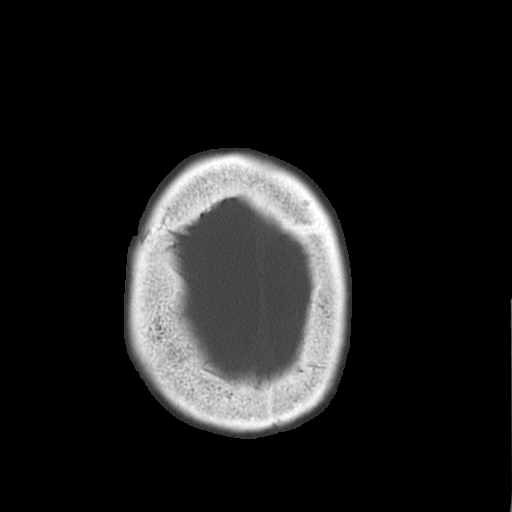

[17 of 30 positions shown; findings below may reference images not displayed]

FINDINGS: There is age related volume loss. There is no intracranial mass,
hemorrhage, extra-axial fluid collection, or midline shift. There is
slight small vessel disease adjacent to the frontal horns of the
lateral ventricles. Elsewhere gray-white compartments appear normal.
No acute infarct is apparent. The bony calvarium appears intact. The
mastoid air cells are clear. There is debris in the left external
auditory canal.
IMPRESSION: Minimal periventricular small vessel disease. No intracranial mass,
hemorrhage, or acute appearing infarct. There is probable cerumen in
the left external auditory canal.

## 2015-08-25 IMAGING — DX DG CHEST 1V PORT
1 series · 1 of 1 positions shown · non-contrast
Comparison: 10/26/2014

CLINICAL DATA: Altered mental status and pneumonia

EXAM:
PORTABLE CHEST - 1 VIEW

[chest ap]
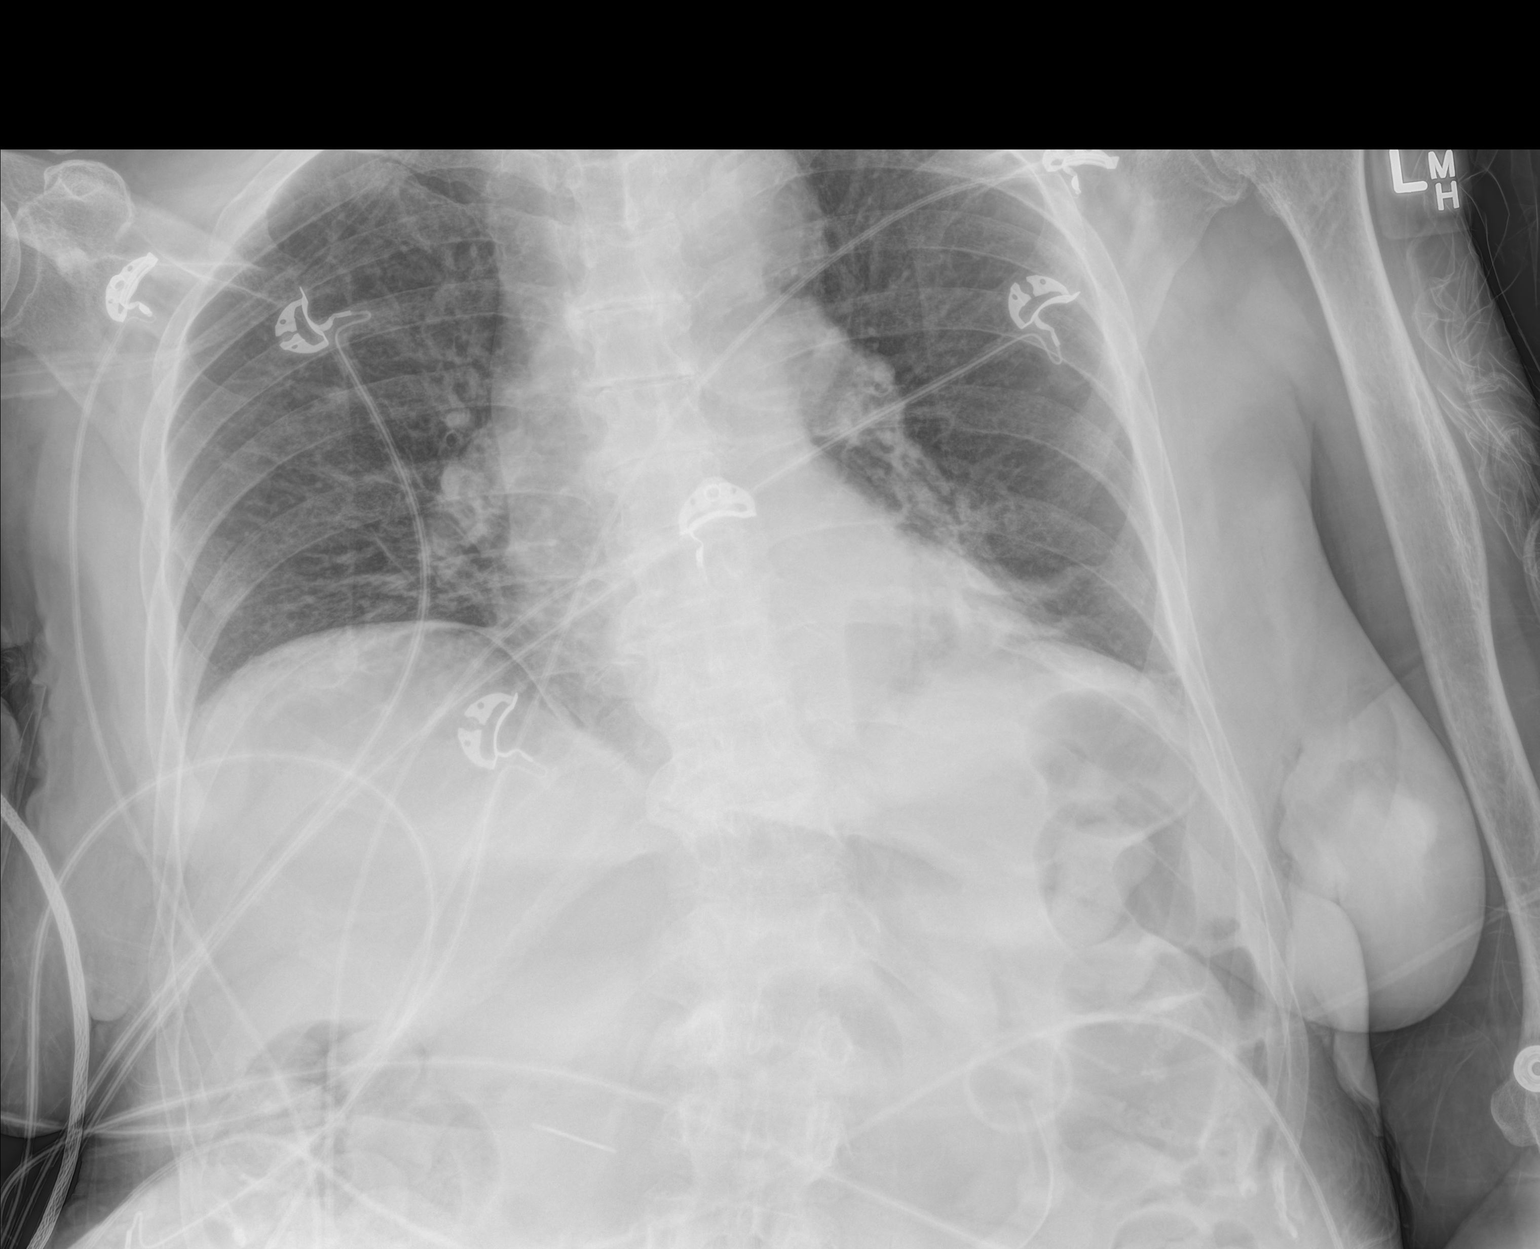

[1 of 1 positions shown; findings below may reference images not displayed]

FINDINGS: A retrocardiac opacity is streaky and stable. Normal heart size and
stable mediastinal contours. Pneumoperitoneum as expected after
percutaneous gastrostomy tube earlier the same day. No edema,
effusion, or pneumothorax.
IMPRESSION: 1. Unchanged retrocardiac atelectasis or pneumonia.
2. Pneumoperitoneum, expected after percutaneous gastrostomy tube
earlier today.
